# Patient Record
Sex: Female | Born: 1975 | Race: Black or African American | Hispanic: No | Marital: Single | Smoking: Never smoker
Health system: Southern US, Community
[De-identification: ages and names within clinical notes are randomized; demographics above are authoritative.]

## PROBLEM LIST (undated history)

## (undated) DIAGNOSIS — D649 Anemia, unspecified: Secondary | ICD-10-CM

## (undated) DIAGNOSIS — E119 Type 2 diabetes mellitus without complications: Secondary | ICD-10-CM

## (undated) DIAGNOSIS — I10 Essential (primary) hypertension: Secondary | ICD-10-CM

## (undated) DIAGNOSIS — I471 Supraventricular tachycardia, unspecified: Secondary | ICD-10-CM

## (undated) HISTORY — PX: NO PAST SURGERIES: SHX2092

---

## 1998-08-09 ENCOUNTER — Encounter: Payer: Self-pay | Admitting: Emergency Medicine

## 1998-08-09 ENCOUNTER — Emergency Department (HOSPITAL_COMMUNITY): Admission: EM | Admit: 1998-08-09 | Discharge: 1998-08-09 | Payer: Self-pay | Admitting: Emergency Medicine

## 2001-04-08 ENCOUNTER — Ambulatory Visit (HOSPITAL_COMMUNITY): Admission: RE | Admit: 2001-04-08 | Discharge: 2001-04-08 | Payer: Self-pay | Admitting: *Deleted

## 2001-04-08 ENCOUNTER — Encounter: Payer: Self-pay | Admitting: Family Medicine

## 2001-04-22 ENCOUNTER — Encounter: Admission: RE | Admit: 2001-04-22 | Discharge: 2001-07-21 | Payer: Self-pay | Admitting: Family Medicine

## 2001-06-04 ENCOUNTER — Other Ambulatory Visit: Admission: RE | Admit: 2001-06-04 | Discharge: 2001-06-04 | Payer: Self-pay | Admitting: Family Medicine

## 2002-10-07 ENCOUNTER — Emergency Department (HOSPITAL_COMMUNITY): Admission: EM | Admit: 2002-10-07 | Discharge: 2002-10-07 | Payer: Self-pay | Admitting: Internal Medicine

## 2004-05-25 ENCOUNTER — Emergency Department (HOSPITAL_COMMUNITY): Admission: EM | Admit: 2004-05-25 | Discharge: 2004-05-25 | Payer: Self-pay | Admitting: Emergency Medicine

## 2009-10-02 ENCOUNTER — Emergency Department (HOSPITAL_COMMUNITY): Admission: EM | Admit: 2009-10-02 | Discharge: 2009-10-02 | Payer: Self-pay | Admitting: Emergency Medicine

## 2009-10-02 IMAGING — CR DG CHEST 1V PORT
1 series · 1 of 1 positions shown · non-contrast
Comparison: None.

CLINICAL DATA: Chest pain, shortness of breath

PORTABLE CHEST - 1 VIEW

[view not recorded]
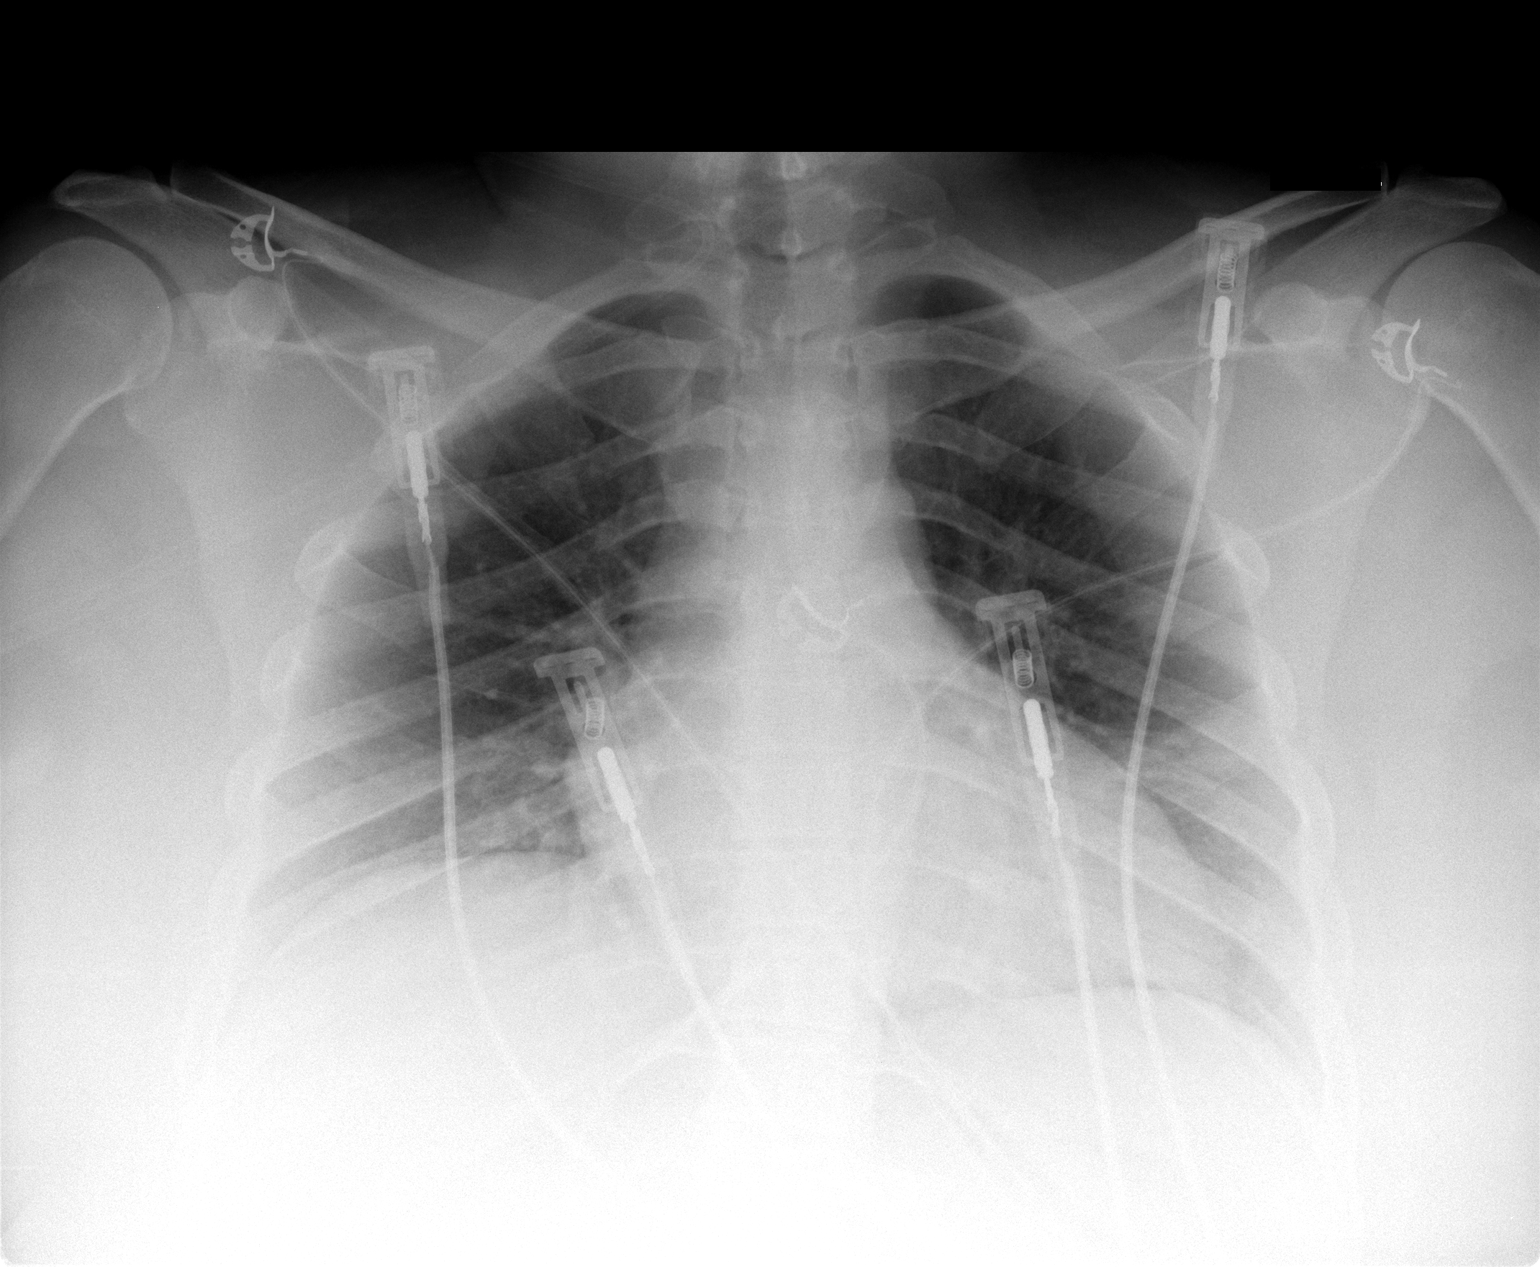

[1 of 1 positions shown; findings below may reference images not displayed]

FINDINGS: Slight elevation of the right hemidiaphragm.  Minimal
left hilar and right base atelectasis.  Normal heart size and
vascularity.  No definite pneumonia, effusion or pneumothorax.
IMPRESSION: Bilateral atelectasis.

## 2010-10-06 LAB — COMPREHENSIVE METABOLIC PANEL
ALT: 24 U/L (ref 0–35)
AST: 21 U/L (ref 0–37)
Albumin: 3.6 g/dL (ref 3.5–5.2)
Alkaline Phosphatase: 93 U/L (ref 39–117)
BUN: 8 mg/dL (ref 6–23)
CO2: 25 mEq/L (ref 19–32)
Calcium: 9.8 mg/dL (ref 8.4–10.5)
Chloride: 103 mEq/L (ref 96–112)
Creatinine, Ser: 0.77 mg/dL (ref 0.4–1.2)
GFR calc Af Amer: >60 mL/min (ref >60)
GFR calc non Af Amer: >60 mL/min (ref >60)
Glucose, Bld: 211 mg/dL — ABNORMAL HIGH (ref 70–99)
Potassium: 3.7 mEq/L (ref 3.5–5.1)
Sodium: 137 mEq/L (ref 135–145)
Total Bilirubin: 0.2 mg/dL — ABNORMAL LOW (ref 0.3–1.2)
Total Protein: 8.4 g/dL — ABNORMAL HIGH (ref 6.0–8.3)

## 2010-10-06 LAB — CBC
HCT: 35.8 % — ABNORMAL LOW (ref 36.0–46.0)
Hemoglobin: 11.6 g/dL — ABNORMAL LOW (ref 12.0–15.0)
MCHC: 32.4 g/dL (ref 30.0–36.0)
MCV: 79.1 fL (ref 78.0–100.0)
Platelets: 551 10*3/uL — ABNORMAL HIGH (ref 150–400)
RBC: 4.52 MIL/uL (ref 3.87–5.11)
RDW: 16.1 % — ABNORMAL HIGH (ref 11.5–15.5)
WBC: 11.1 10*3/uL — ABNORMAL HIGH (ref 4.0–10.5)

## 2010-10-06 LAB — DIFFERENTIAL
Basophils Relative: 0 % (ref 0–1)
Eosinophils Absolute: 0.2 10*3/uL (ref 0.0–0.7)
Eosinophils Relative: 2 % (ref 0–5)
Monocytes Absolute: 0.5 10*3/uL (ref 0.1–1.0)
Monocytes Relative: 5 % (ref 3–12)
Neutrophils Relative %: 56 % (ref 43–77)

## 2010-10-06 LAB — GLUCOSE, CAPILLARY: Glucose-Capillary: 199 mg/dL — ABNORMAL HIGH (ref 70–99)

## 2012-08-17 ENCOUNTER — Emergency Department (HOSPITAL_COMMUNITY): Payer: Self-pay

## 2012-08-17 ENCOUNTER — Encounter (HOSPITAL_COMMUNITY): Payer: Self-pay | Admitting: Emergency Medicine

## 2012-08-17 ENCOUNTER — Emergency Department (HOSPITAL_COMMUNITY)
Admission: EM | Admit: 2012-08-17 | Discharge: 2012-08-17 | Disposition: A | Discharge status: Home / Self Care | Payer: Self-pay | Attending: Emergency Medicine | Admitting: Emergency Medicine

## 2012-08-17 DIAGNOSIS — I10 Essential (primary) hypertension: Secondary | ICD-10-CM | POA: Insufficient documentation

## 2012-08-17 DIAGNOSIS — Z3202 Encounter for pregnancy test, result negative: Secondary | ICD-10-CM | POA: Insufficient documentation

## 2012-08-17 DIAGNOSIS — N949 Unspecified condition associated with female genital organs and menstrual cycle: Secondary | ICD-10-CM | POA: Insufficient documentation

## 2012-08-17 DIAGNOSIS — E119 Type 2 diabetes mellitus without complications: Secondary | ICD-10-CM | POA: Insufficient documentation

## 2012-08-17 DIAGNOSIS — R197 Diarrhea, unspecified: Secondary | ICD-10-CM | POA: Insufficient documentation

## 2012-08-17 DIAGNOSIS — D649 Anemia, unspecified: Secondary | ICD-10-CM | POA: Insufficient documentation

## 2012-08-17 DIAGNOSIS — N938 Other specified abnormal uterine and vaginal bleeding: Secondary | ICD-10-CM | POA: Insufficient documentation

## 2012-08-17 HISTORY — DX: Type 2 diabetes mellitus without complications: E11.9

## 2012-08-17 HISTORY — DX: Essential (primary) hypertension: I10

## 2012-08-17 LAB — CBC WITH DIFFERENTIAL/PLATELET
Basophils Relative: 0 % (ref 0–1)
Eosinophils Relative: 2 % (ref 0–5)
HCT: 28.5 % — ABNORMAL LOW (ref 36.0–46.0)
Hemoglobin: 8.1 g/dL — ABNORMAL LOW (ref 12.0–15.0)
Lymphs Abs: 2.7 10*3/uL (ref 0.7–4.0)
MCH: 19.8 pg — ABNORMAL LOW (ref 26.0–34.0)
MCV: 69.7 fL — ABNORMAL LOW (ref 78.0–100.0)
Monocytes Absolute: 0.4 10*3/uL (ref 0.1–1.0)
RBC: 4.09 MIL/uL (ref 3.87–5.11)

## 2012-08-17 LAB — URINALYSIS, ROUTINE W REFLEX MICROSCOPIC
Bilirubin Urine: NEGATIVE
Nitrite: NEGATIVE
Protein, ur: NEGATIVE mg/dL
Specific Gravity, Urine: 1.01 (ref 1.005–1.030)
Urobilinogen, UA: 0.2 mg/dL (ref 0.0–1.0)

## 2012-08-17 LAB — BASIC METABOLIC PANEL
BUN: 6 mg/dL (ref 6–23)
Chloride: 103 mEq/L (ref 96–112)
GFR calc Af Amer: >90 mL/min (ref >90)
Glucose, Bld: 140 mg/dL — ABNORMAL HIGH (ref 70–99)
Potassium: 3.9 mEq/L (ref 3.5–5.1)
Sodium: 140 mEq/L (ref 135–145)

## 2012-08-17 LAB — PREGNANCY, URINE: Preg Test, Ur: NEGATIVE

## 2012-08-17 IMAGING — US US TRANSVAGINAL NON-OB
1 series · 13 of 25 positions shown · non-contrast
Comparison: None

CLINICAL DATA: Fatigue.  Anemia.  Heavy bleeding.  Last menstrual
period was [DATE].



[Series 1: us transvaginal non-ob · 0.31mm/px · 13 of 105 slices shown]
[im 1/105]
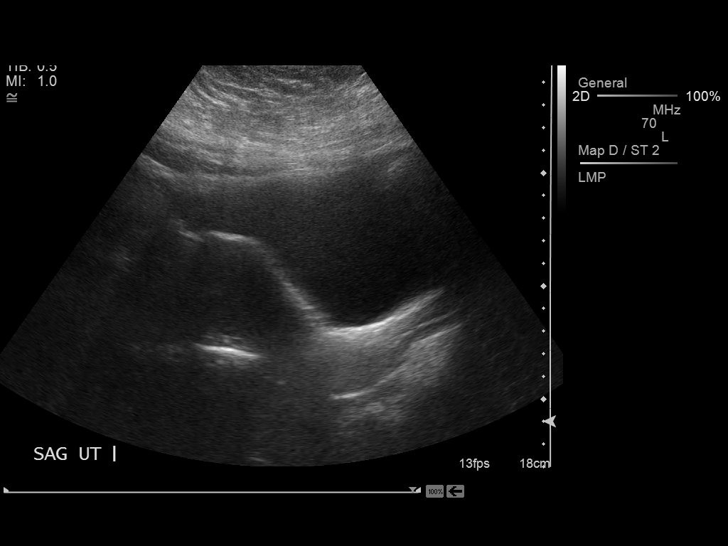
[im 9/105]
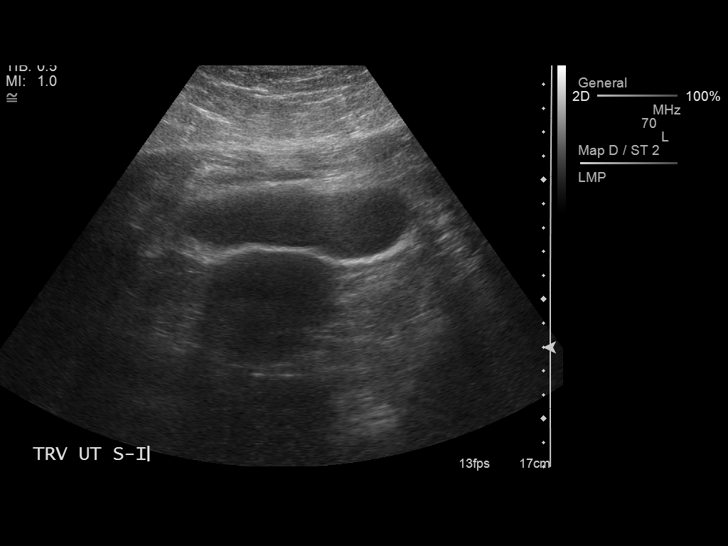
[im 18/105]
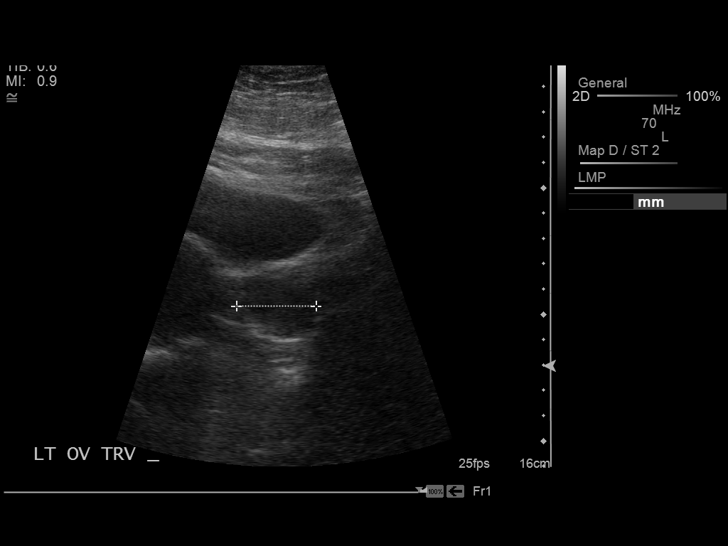
[im 27/105]
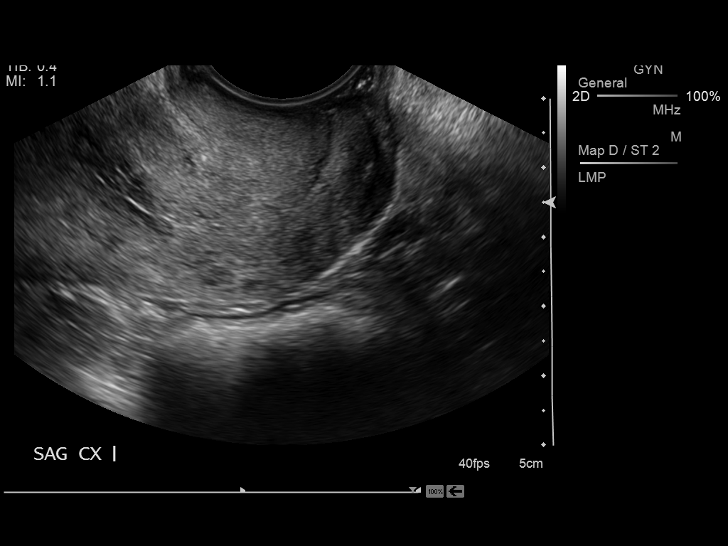
[im 35/105]
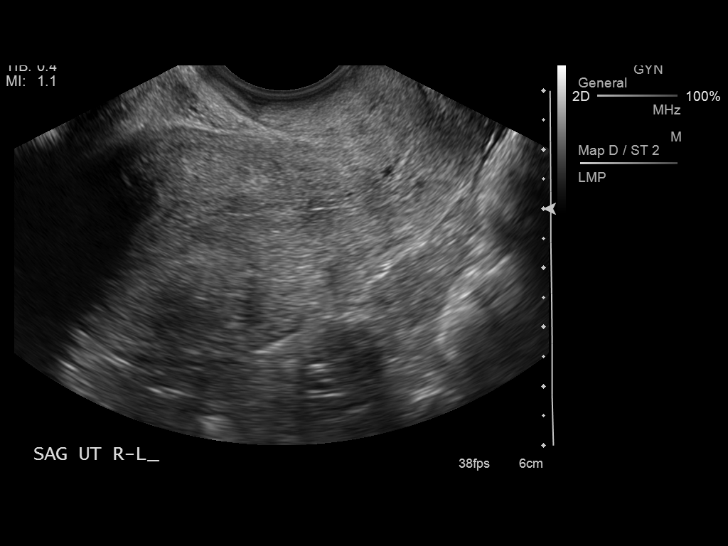
[im 44/105]
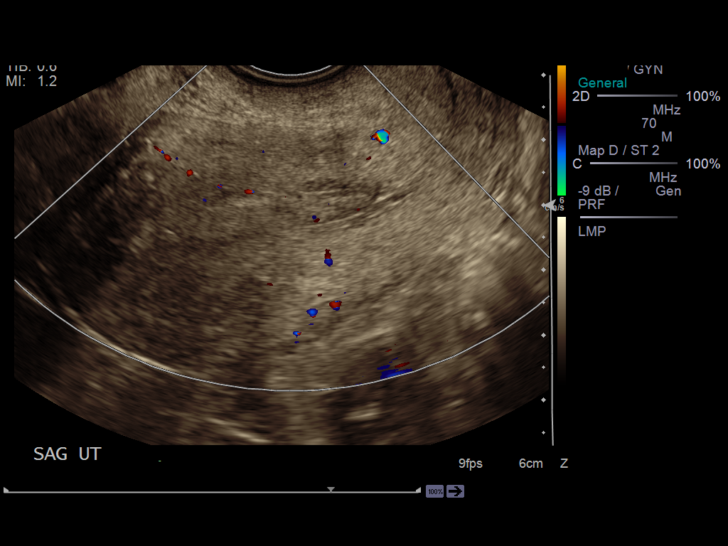
[im 53/105]
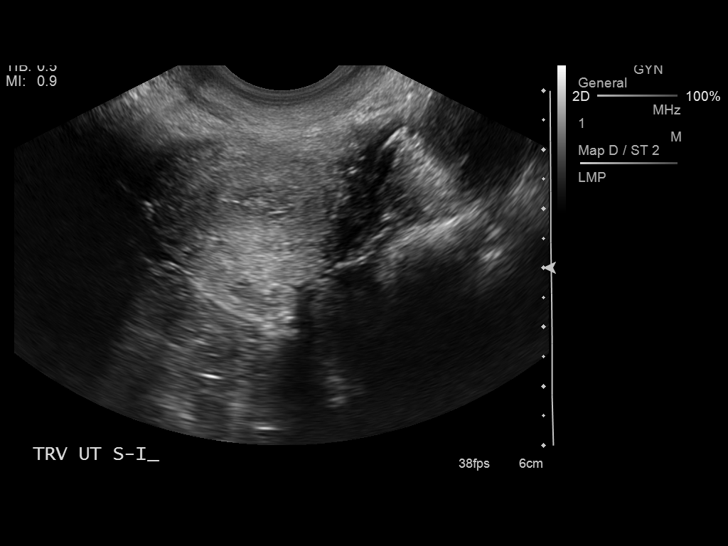
[im 61/105]
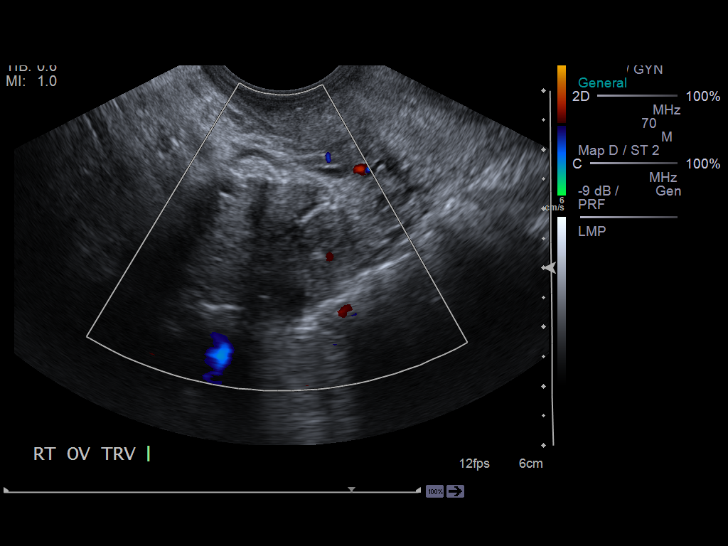
[im 70/105]
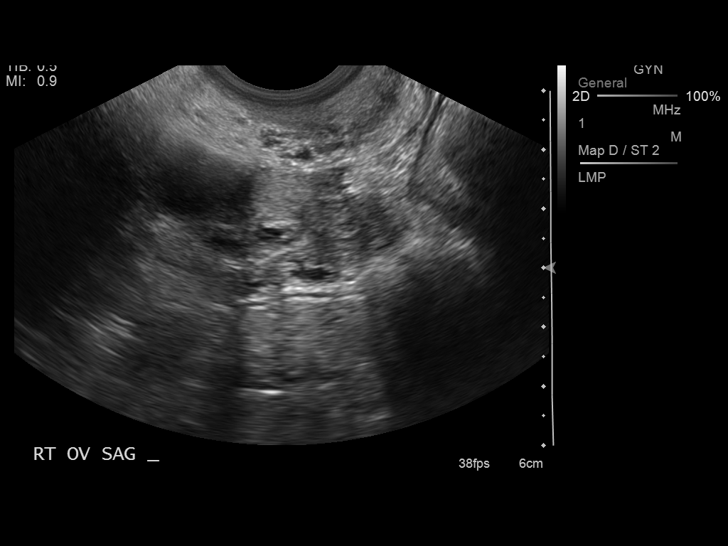
[im 79/105]
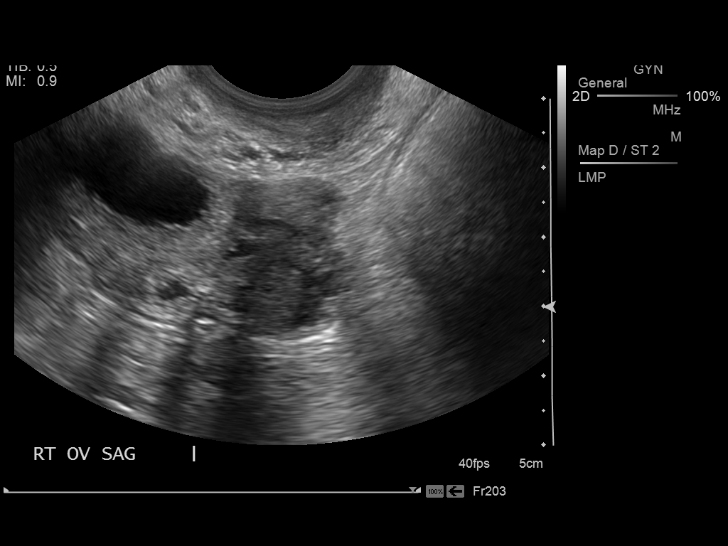
[im 87/105]
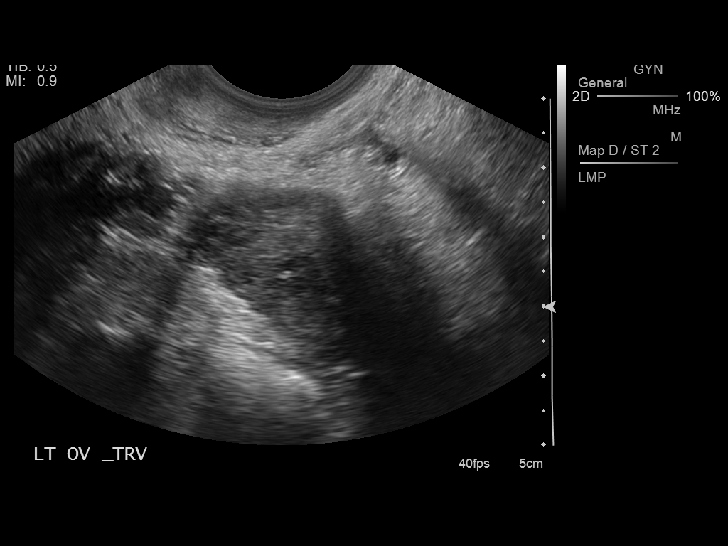
[im 96/105]
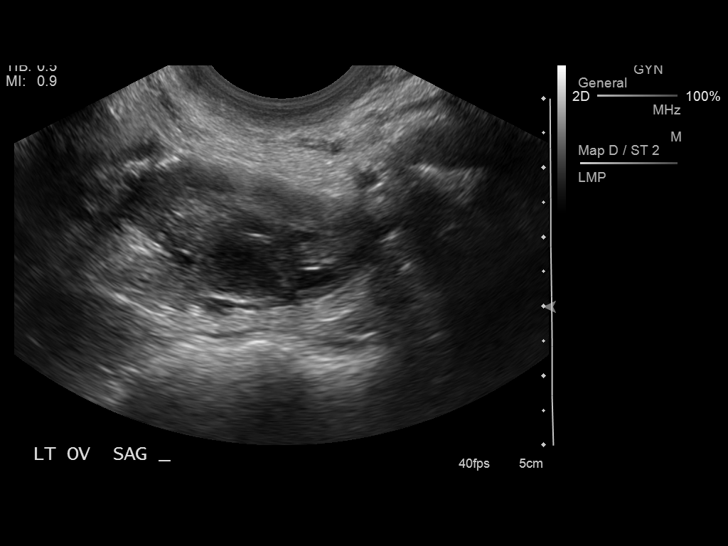
[im 105/105]
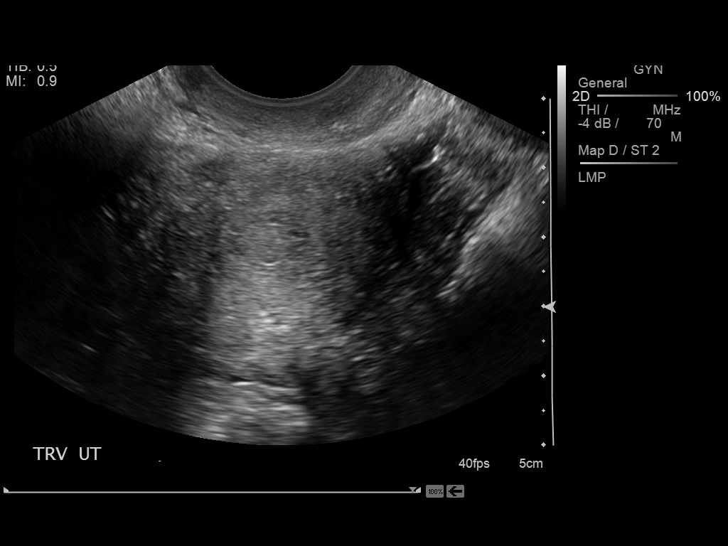

[13 of 25 positions shown; findings below may reference images not displayed]

FINDINGS: Uterus: 7.8 x 4.3 x 5 cm.

Endometrium: 9.4 mm.  A small amount of hypoechoic material is
identified within the canal.

Right ovary:  4.7 x 2.6 x 3.0 cm.  Follicles are present.

Left ovary: 3.7 x 2.0 x 2.6 cm.  Follicles are present.

Other findings: No free fluid
IMPRESSION: 1.  Hypoechoic material within the endometrial canal, raising the
question of endometrial polyp.  Endometrial carcinoma would be much
less likely. Consider further evaluation with sonohysterogram for
confirmation prior to hysteroscopy.  Endometrial sampling should
also be considered if patient is at high risk for endometrial
carcinoma. (Ref:  Radiological Reasoning: Algorithmic Workup of
Abnormal Vaginal Bleeding with Endovaginal Sonography and
Sonohysterography. AJR [40]; 191:S68-73)
2.  Normal-appearing ovaries.

3.  No evidence for uterine mass..

## 2012-08-17 NOTE — ED Notes (Signed)
Pt reports that she was sent by [facility] for further eval of low hgb,  She stated she is normally 7.6 but today was 7.0 has been weak.  And more tired than usual.  Denies any blood in stool.  Stated she has heavy periods and think may be related.

## 2012-08-17 NOTE — ED Provider Notes (Signed)
History    This chart was scribed for Benny Lennert, MD, MD by Smitty Pluck, ED Scribe. The patient was seen in room APA01 and the patient's care was started at 1:39 PM.   CSN: [id number]  Arrival date & time 08/17/12  1240      Chief Complaint  Patient presents with  . Fatigue  . Anemia    Patient is a 37 y.o. female presenting with anemia. The history is provided by the patient. No language interpreter was used.  Anemia This is a chronic problem. The problem occurs every several days. The problem has not changed since onset.Pertinent negatives include no chest pain, no abdominal pain and no headaches. Nothing aggravates the symptoms. Nothing relieves the symptoms.   Kathryn Morris is a 37 y.o. female w/ h/o DM, HTN and anemia who presents to the Emergency Department complaining of fatigue onset 3 months ago. Pt was seen at PCP at [facility] and was told she was anemic (hgb 7.6 and hct 26.0). She reports having mild lower abdominal pain. She states she has taken iron supplements due to hx of anemia and has dark colored stools. She denies emesis, diarrhea, fever and any other pain.   Past Medical History  Diagnosis Date  . Diabetes mellitus without complication   . Hypertension     History reviewed. No pertinent past surgical history.  Family History  Problem Relation Age of Onset  . Diabetes Mother   . Heart failure Mother   . Diabetes Other     History  Substance Use Topics  . Smoking status: Never Smoker   . Smokeless tobacco: Never Used  . Alcohol Use: No    OB History    Grav Para Term Preterm Abortions TAB SAB Ect Mult Living   1    1  1    0      Review of Systems  Constitutional: Positive for fatigue.  HENT: Negative for congestion, sinus pressure and ear discharge.   Eyes: Negative for discharge.  Respiratory: Negative for cough.   Cardiovascular: Negative for chest pain.  Gastrointestinal: Negative for vomiting, abdominal pain and  diarrhea.  Genitourinary: Negative for frequency and hematuria.  Musculoskeletal: Negative for back pain.  Skin: Negative for rash.  Neurological: Negative for seizures and headaches.  Hematological: Negative.   Psychiatric/Behavioral: Negative for hallucinations.  All other systems reviewed and are negative.   10 Systems reviewed and all are negative for acute change except as noted in the HPI.   Allergies  Review of patient's allergies indicates no known allergies.  Home Medications  No current outpatient prescriptions on file.  BP 182/82  Pulse 78  Temp 98.2 F (36.8 C) (Oral)  Resp 20  Ht 5\' 11"  (1.803 m)  Wt 302 lb (136.986 kg)  BMI 42.12 kg/m2  SpO2 100%  LMP 08/07/2012  Physical Exam  Nursing note and vitals reviewed. Constitutional: She is oriented to person, place, and time. She appears well-developed.       obese  HENT:  Head: Normocephalic and atraumatic.  Eyes: Conjunctivae normal and EOM are normal. No scleral icterus.  Neck: Neck supple. No thyromegaly present.  Cardiovascular: Normal rate and regular rhythm.  Exam reveals no gallop and no friction rub.   No murmur heard. Pulmonary/Chest: No stridor. She has no wheezes. She has no rales. She exhibits no tenderness.  Abdominal: She exhibits no distension. There is no tenderness. There is no rebound.  Genitourinary:  Heme negative  Musculoskeletal: Normal range of motion. She exhibits no edema.  Lymphadenopathy:    She has no cervical adenopathy.  Neurological: She is oriented to person, place, and time. Coordination normal.  Skin: No rash noted. No erythema.  Psychiatric: She has a normal mood and affect. Her behavior is normal.    ED Course  Procedures (including critical care time) DIAGNOSTIC STUDIES: Oxygen Saturation is 100% on room air, normal by my interpretation.    COORDINATION OF CARE: 1:43 PM Discussed ED treatment with pt and pt agrees.     Labs Reviewed  CBC WITH  DIFFERENTIAL - Abnormal; Notable for the following:    Hemoglobin 8.1 (*)     HCT 28.5 (*)     MCV 69.7 (*)     MCH 19.8 (*)     MCHC 28.4 (*)     RDW 19.6 (*)     Platelets 507 (*)     All other components within normal limits  BASIC METABOLIC PANEL - Abnormal; Notable for the following:    Glucose, Bld 140 (*)     All other components within normal limits  PREGNANCY, URINE  URINALYSIS, ROUTINE W REFLEX MICROSCOPIC  TYPE AND SCREEN   US Transvaginal Non-ob  08/17/2012  *RADIOLOGY REPORT*  Clinical Data: Fatigue.  Anemia.  Heavy bleeding.  Last menstrual period was 08/07/2012.  TRANSABDOMINAL AND TRANSVAGINAL ULTRASOUND OF PELVIS Technique:  Both transabdominal and transvaginal ultrasound examinations of the pelvis were performed. Transabdominal technique was performed for global imaging of the pelvis including uterus, ovaries, adnexal regions, and pelvic cul-de-sac.  It was necessary to proceed with endovaginal exam following the transabdominal exam to visualize the endometrium and ovaries.  Comparison:  None  Findings:  Uterus: 7.8 x 4.3 x 5 cm.  Endometrium: 9.4 mm.  A small amount of hypoechoic material is identified within the canal.  Right ovary:  4.7 x 2.6 x 3.0 cm.  Follicles are present.  Left ovary: 3.7 x 2.0 x 2.6 cm.  Follicles are present.  Other findings: No free fluid  IMPRESSION:  1.  Hypoechoic material within the endometrial canal, raising the question of endometrial polyp.  Endometrial carcinoma would be much less likely. Consider further evaluation with sonohysterogram for confirmation prior to hysteroscopy.  Endometrial sampling should also be considered if patient is at high risk for endometrial carcinoma. (Ref:  Radiological Reasoning: Algorithmic Workup of Abnormal Vaginal Bleeding with Endovaginal Sonography and Sonohysterography. AJR 2008; 409:W11-91) 2.  Normal-appearing ovaries.  3.  No evidence for uterine mass.   Original Report Authenticated By: Norva Pavlov, M.D.     US Pelvis Complete  08/17/2012  *RADIOLOGY REPORT*  Clinical Data: Fatigue.  Anemia.  Heavy bleeding.  Last menstrual period was 08/07/2012.  TRANSABDOMINAL AND TRANSVAGINAL ULTRASOUND OF PELVIS Technique:  Both transabdominal and transvaginal ultrasound examinations of the pelvis were performed. Transabdominal technique was performed for global imaging of the pelvis including uterus, ovaries, adnexal regions, and pelvic cul-de-sac.  It was necessary to proceed with endovaginal exam following the transabdominal exam to visualize the endometrium and ovaries.  Comparison:  None  Findings:  Uterus: 7.8 x 4.3 x 5 cm.  Endometrium: 9.4 mm.  A small amount of hypoechoic material is identified within the canal.  Right ovary:  4.7 x 2.6 x 3.0 cm.  Follicles are present.  Left ovary: 3.7 x 2.0 x 2.6 cm.  Follicles are present.  Other findings: No free fluid  IMPRESSION:  1.  Hypoechoic material within the  endometrial canal, raising the question of endometrial polyp.  Endometrial carcinoma would be much less likely. Consider further evaluation with sonohysterogram for confirmation prior to hysteroscopy.  Endometrial sampling should also be considered if patient is at high risk for endometrial carcinoma. (Ref:  Radiological Reasoning: Algorithmic Workup of Abnormal Vaginal Bleeding with Endovaginal Sonography and Sonohysterography. AJR 2008; 782:N56-21) 2.  Normal-appearing ovaries.  3.  No evidence for uterine mass.   Original Report Authenticated By: Norva Pavlov, M.D.      No diagnosis found.    MDM      Abnormal uterine bleeding  The chart was scribed for me under my direct supervision.  I personally performed the history, physical, and medical decision making and all procedures in the evaluation of this patient.Benny Lennert, MD 08/17/12 [phone]

## 2012-08-17 NOTE — ED Notes (Signed)
Pt to ultrasound via stretcher

## 2012-08-17 NOTE — ED Notes (Signed)
Patient sent by [facility] due to low hgb 7.6 and hct 26.0. Patient reports feeling fatigue since October and was told to take iron pills. Patient denies any blood in urine or stool but reports stools as dark due to the iron pills. Patient also reports upper to navel region abd pain and nausea. Denies fevers or vomiting. Reports diarrhea but states "it is always like that cause I take Metformin."

## 2012-08-18 LAB — TYPE AND SCREEN: Antibody Screen: NEGATIVE

## 2014-05-15 ENCOUNTER — Encounter (HOSPITAL_COMMUNITY): Payer: Self-pay | Admitting: Emergency Medicine

## 2014-06-22 ENCOUNTER — Emergency Department (HOSPITAL_COMMUNITY)
Admission: EM | Admit: 2014-06-22 | Discharge: 2014-06-22 | Disposition: A | Discharge status: Home / Self Care | Payer: Self-pay | Attending: Emergency Medicine | Admitting: Emergency Medicine

## 2014-06-22 ENCOUNTER — Emergency Department (HOSPITAL_COMMUNITY): Payer: Self-pay

## 2014-06-22 ENCOUNTER — Encounter (HOSPITAL_COMMUNITY): Payer: Self-pay | Admitting: Emergency Medicine

## 2014-06-22 DIAGNOSIS — Z79899 Other long term (current) drug therapy: Secondary | ICD-10-CM | POA: Insufficient documentation

## 2014-06-22 DIAGNOSIS — J159 Unspecified bacterial pneumonia: Secondary | ICD-10-CM | POA: Insufficient documentation

## 2014-06-22 DIAGNOSIS — R059 Cough, unspecified: Secondary | ICD-10-CM

## 2014-06-22 DIAGNOSIS — R05 Cough: Secondary | ICD-10-CM

## 2014-06-22 DIAGNOSIS — Z792 Long term (current) use of antibiotics: Secondary | ICD-10-CM | POA: Insufficient documentation

## 2014-06-22 DIAGNOSIS — E119 Type 2 diabetes mellitus without complications: Secondary | ICD-10-CM | POA: Insufficient documentation

## 2014-06-22 DIAGNOSIS — I10 Essential (primary) hypertension: Secondary | ICD-10-CM | POA: Insufficient documentation

## 2014-06-22 DIAGNOSIS — Z794 Long term (current) use of insulin: Secondary | ICD-10-CM | POA: Insufficient documentation

## 2014-06-22 DIAGNOSIS — J189 Pneumonia, unspecified organism: Secondary | ICD-10-CM

## 2014-06-22 IMAGING — CR DG CHEST 2V
2 series · 2 of 2 positions shown · non-contrast
Comparison: [DATE]

CLINICAL DATA: Cough, congestion, chills, and body aches for 1
week.

EXAM:
CHEST  2 VIEW

[view not recorded (1 of 2)]
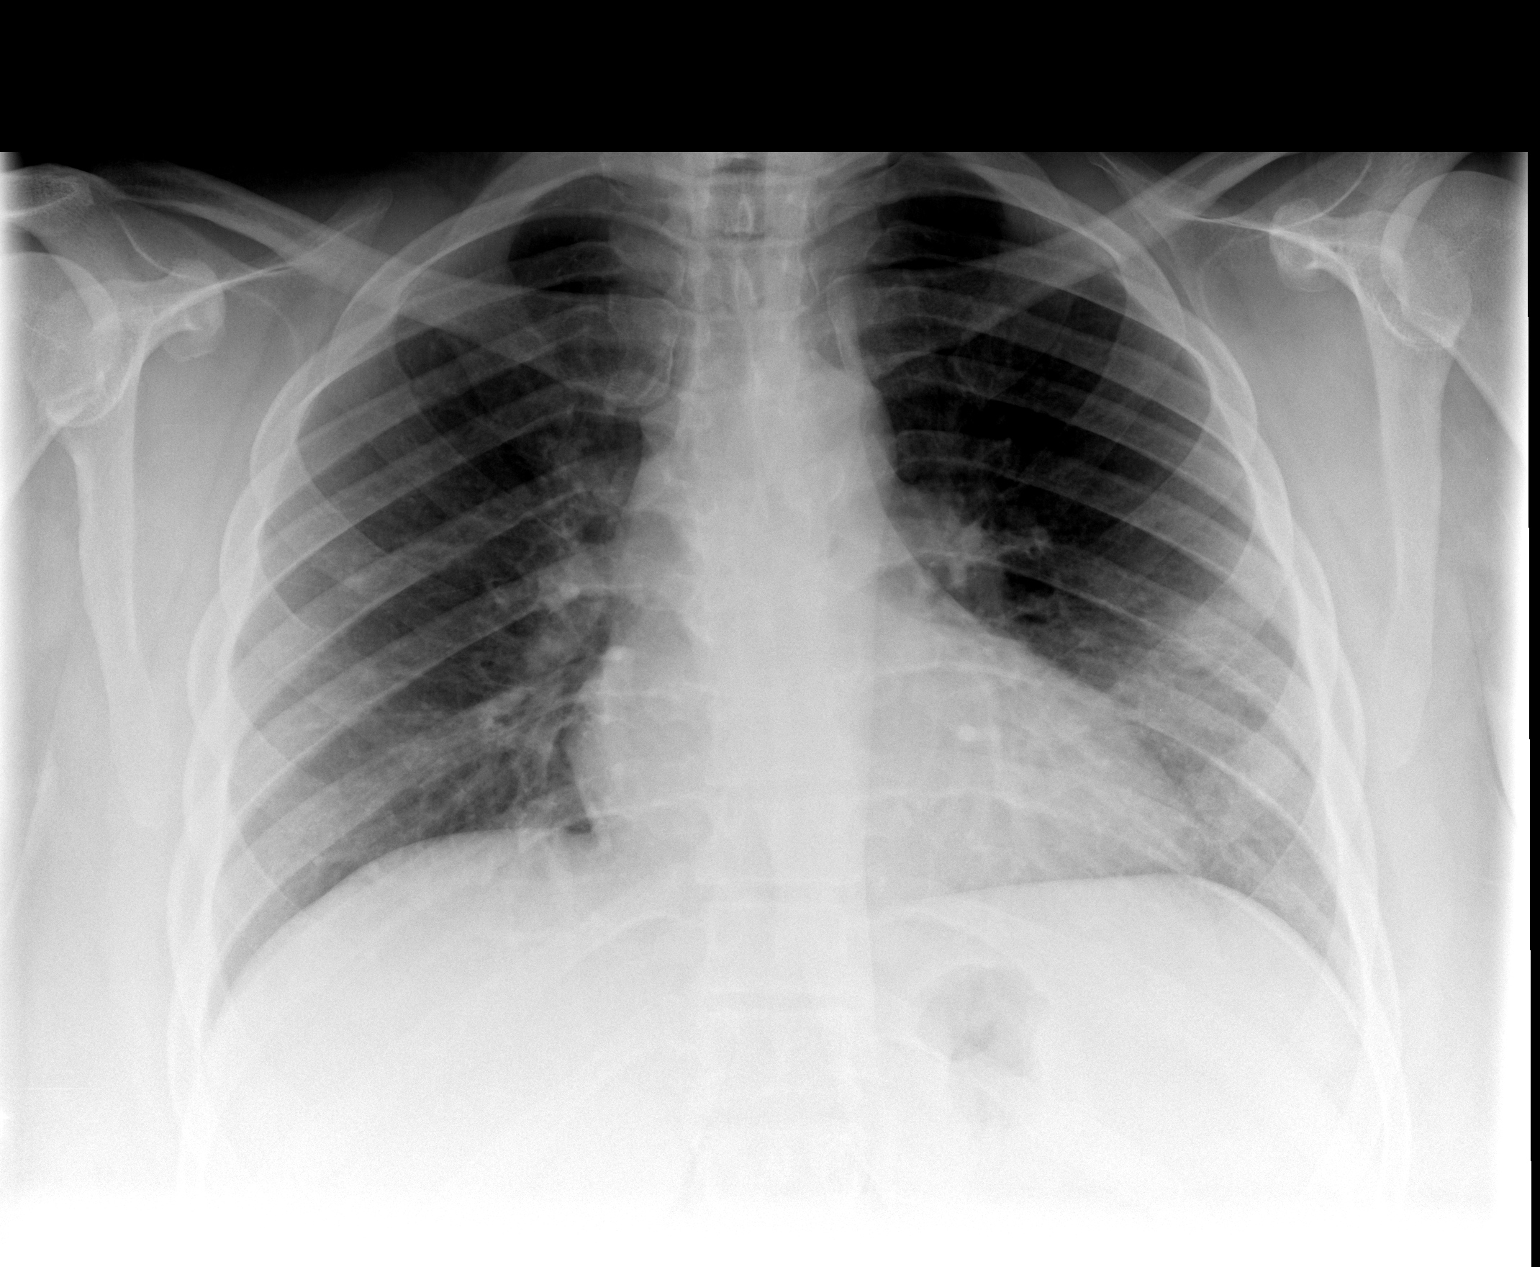

[view not recorded (2 of 2)]
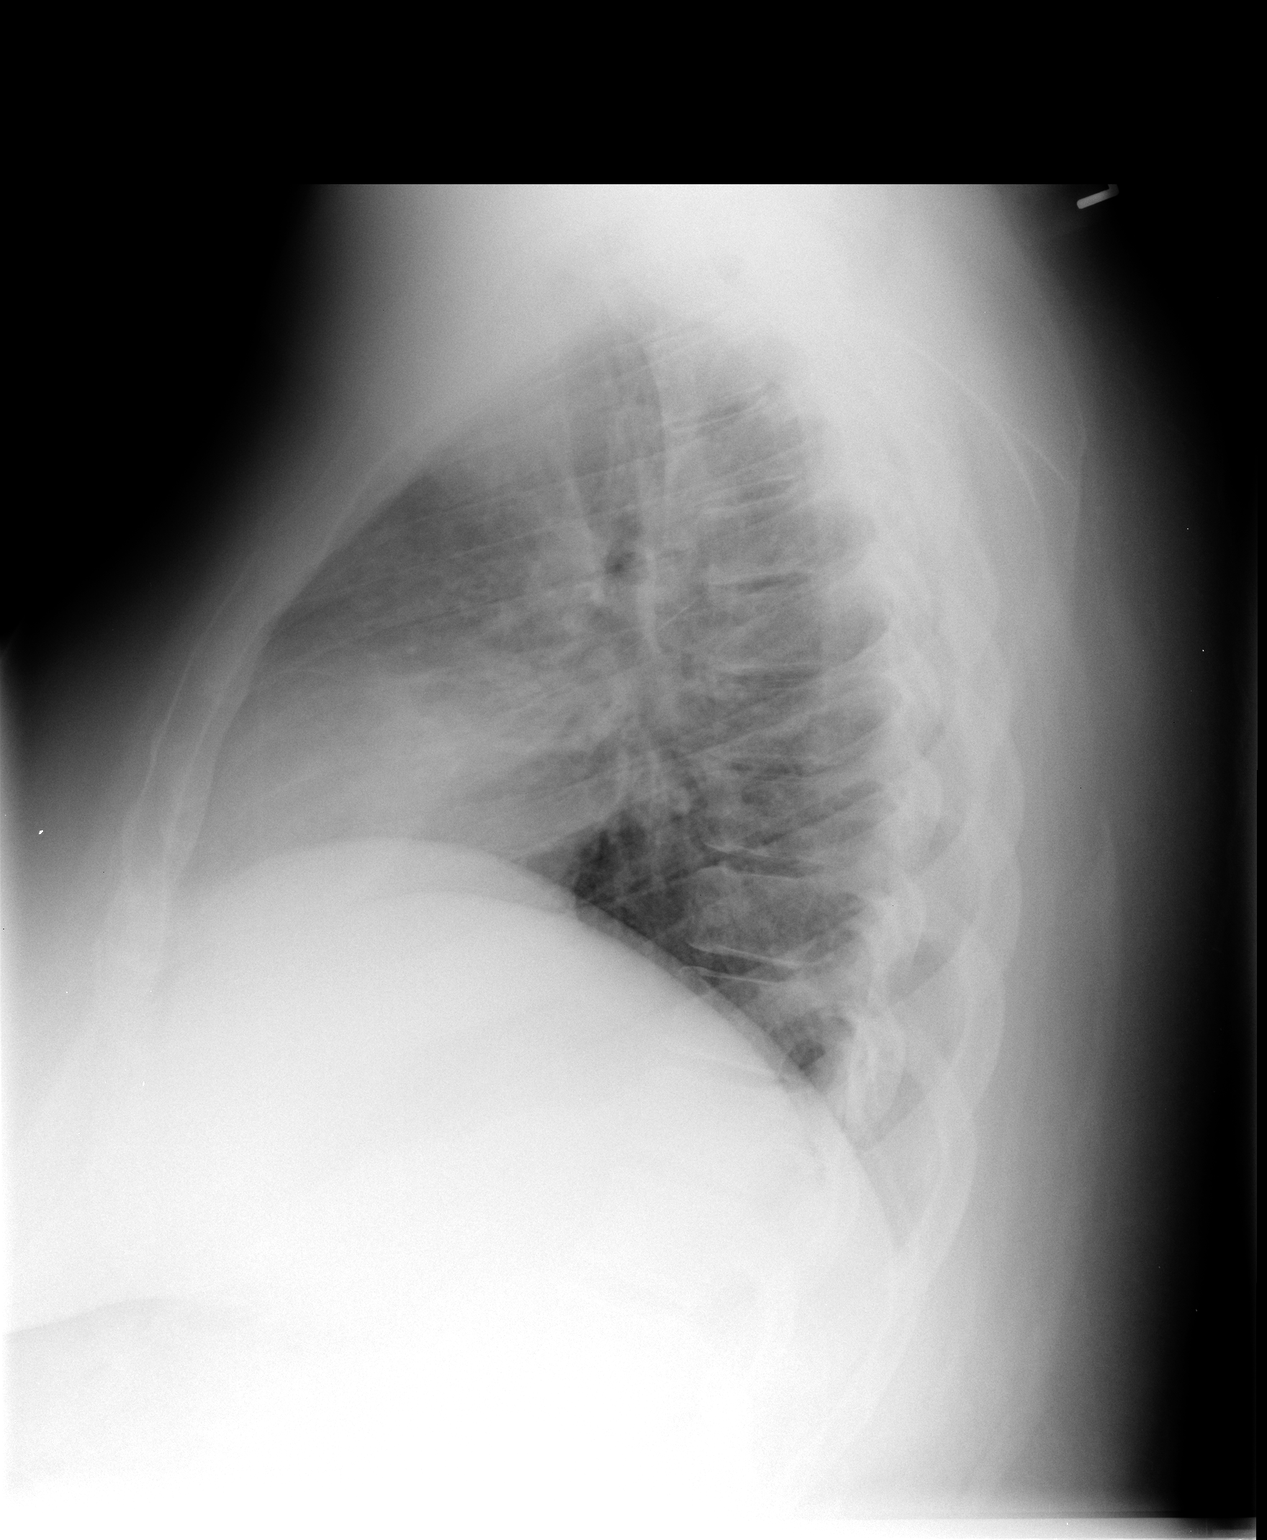

[2 of 2 positions shown; findings below may reference images not displayed]

FINDINGS: Normal heart size and pulmonary vascularity. There is suggestion of
focal patchy infiltration in the left lingula which may represent
early acute pneumonia. No blunting of costophrenic angles. No
pneumothorax.
IMPRESSION: Early infiltration suggested in the left lingula.

## 2014-06-22 MED ORDER — AZITHROMYCIN 250 MG PO TABS: 250.0000 mg | ORAL_TABLET | Freq: Once | ORAL | Status: DC

## 2014-06-22 MED ORDER — AZITHROMYCIN 250 MG PO TABS
500.0000 mg | ORAL_TABLET | Freq: Once | ORAL | Status: AC
  Administered 2014-06-22: 500 mg via ORAL
  Filled 2014-06-22: qty 2

## 2014-06-22 NOTE — Discharge Instructions (Signed)
Follow up with your md in one week 

## 2014-06-22 NOTE — ED Notes (Signed)
Pt c/o increased sob and congestion x one week.

## 2014-06-22 NOTE — ED Provider Notes (Signed)
CSN: [id number]     Arrival date & time 06/22/14  0010 History   First MD Initiated Contact with Patient 06/22/14 0049     Chief Complaint  Patient presents with  . Shortness of Breath     (Consider location/radiation/quality/duration/timing/severity/associated sxs/prior Treatment) Patient is a 38 y.o. female presenting with shortness of breath. The history is provided by the patient (the pt complains of a cough for one week).  Shortness of Breath Severity:  Moderate Onset quality:  Gradual Timing:  Constant Progression:  Unchanged Chronicity:  New Context: not activity   Associated symptoms: no abdominal pain, no chest pain, no cough, no headaches and no rash     Past Medical History  Diagnosis Date  . Diabetes mellitus without complication   . Hypertension    History reviewed. No pertinent past surgical history. Family History  Problem Relation Age of Onset  . Diabetes Mother   . Heart failure Mother   . Diabetes Other    History  Substance Use Topics  . Smoking status: Never Smoker   . Smokeless tobacco: Never Used  . Alcohol Use: No   OB History    Gravida Para Term Preterm AB TAB SAB Ectopic Multiple Living   1    1  1    0     Review of Systems  Constitutional: Negative for appetite change and fatigue.  HENT: Negative for congestion, ear discharge and sinus pressure.   Eyes: Negative for discharge.  Respiratory: Positive for shortness of breath. Negative for cough.   Cardiovascular: Negative for chest pain.  Gastrointestinal: Negative for abdominal pain and diarrhea.  Genitourinary: Negative for frequency and hematuria.  Musculoskeletal: Negative for back pain.  Skin: Negative for rash.  Neurological: Negative for seizures and headaches.  Psychiatric/Behavioral: Negative for hallucinations.      Allergies  Review of patient's allergies indicates no known allergies.  Home Medications   Prior to Admission medications   Medication Sig Start Date  End Date Taking? Authorizing Provider  azithromycin (ZITHROMAX) 250 MG tablet Take 1 tablet (250 mg total) by mouth once. Take first 2 tablets together, then 1 every day until finished. 06/22/14   Benny LennertJoseph L Rodolfo Notaro, MD  insulin glargine (LANTUS) 100 UNIT/ML injection Inject 45 Units into the skin at bedtime.    Historical Provider, MD  metFORMIN (GLUCOPHAGE) 1000 MG tablet Take 1,000 mg by mouth 2 (two) times daily with a meal.    Historical Provider, MD  metoprolol tartrate (LOPRESSOR) 25 MG tablet Take 25 mg by mouth 2 (two) times daily.    Historical Provider, MD   BP 171/85 mmHg  Pulse 72  Temp(Src) 97.9 F (36.6 C)  Resp 18  Ht 5\' 11"  (1.803 m)  Wt 280 lb (127.007 kg)  BMI 39.07 kg/m2  SpO2 99%  LMP 06/03/2014 (Exact Date) Physical Exam  Constitutional: She is oriented to person, place, and time. She appears well-developed.  HENT:  Head: Normocephalic.  Eyes: Conjunctivae and EOM are normal. No scleral icterus.  Neck: Neck supple. No thyromegaly present.  Cardiovascular: Normal rate and regular rhythm.  Exam reveals no gallop and no friction rub.   No murmur heard. Pulmonary/Chest: No stridor. She has no wheezes. She has no rales. She exhibits no tenderness.  Abdominal: She exhibits no distension. There is no tenderness. There is no rebound.  Musculoskeletal: Normal range of motion. She exhibits no edema.  Lymphadenopathy:    She has no cervical adenopathy.  Neurological: She is oriented to person, place,  and time. She exhibits normal muscle tone. Coordination normal.  Skin: No rash noted. No erythema.  Psychiatric: She has a normal mood and affect. Her behavior is normal.    ED Course  Procedures (including critical care time) Labs Review Labs Reviewed - No data to display  Imaging Review Dg Chest 2 View  06/22/2014   CLINICAL DATA:  Cough, congestion, chills, and body aches for 1 week.  EXAM: CHEST  2 VIEW  COMPARISON:  10/02/2009  FINDINGS: Normal heart size and  pulmonary vascularity. There is suggestion of focal patchy infiltration in the left lingula which may represent early acute pneumonia. No blunting of costophrenic angles. No pneumothorax.  IMPRESSION: Early infiltration suggested in the left lingula.   Electronically Signed   By: Burman NievesWilliam  Stevens M.D.   On: 06/22/2014 01:34     EKG Interpretation None      MDM   Final diagnoses:  Cough  Community acquired pneumonia    Pneumonia,   tx with zithromax    Benny LennertJoseph L Shatyra Becka, MD 06/22/14 [phone]

## 2015-01-11 ENCOUNTER — Emergency Department (HOSPITAL_COMMUNITY)
Admission: EM | Admit: 2015-01-11 | Discharge: 2015-01-11 | Disposition: A | Discharge status: Home / Self Care | Payer: Self-pay | Attending: Emergency Medicine | Admitting: Emergency Medicine

## 2015-01-11 ENCOUNTER — Emergency Department (HOSPITAL_COMMUNITY): Payer: Self-pay

## 2015-01-11 ENCOUNTER — Encounter (HOSPITAL_COMMUNITY): Payer: Self-pay | Admitting: *Deleted

## 2015-01-11 DIAGNOSIS — Z3202 Encounter for pregnancy test, result negative: Secondary | ICD-10-CM | POA: Insufficient documentation

## 2015-01-11 DIAGNOSIS — D649 Anemia, unspecified: Secondary | ICD-10-CM | POA: Insufficient documentation

## 2015-01-11 DIAGNOSIS — Z79899 Other long term (current) drug therapy: Secondary | ICD-10-CM | POA: Insufficient documentation

## 2015-01-11 DIAGNOSIS — R0789 Other chest pain: Secondary | ICD-10-CM | POA: Insufficient documentation

## 2015-01-11 DIAGNOSIS — Z794 Long term (current) use of insulin: Secondary | ICD-10-CM | POA: Insufficient documentation

## 2015-01-11 DIAGNOSIS — I10 Essential (primary) hypertension: Secondary | ICD-10-CM | POA: Insufficient documentation

## 2015-01-11 DIAGNOSIS — E119 Type 2 diabetes mellitus without complications: Secondary | ICD-10-CM | POA: Insufficient documentation

## 2015-01-11 DIAGNOSIS — I471 Supraventricular tachycardia: Secondary | ICD-10-CM | POA: Insufficient documentation

## 2015-01-11 HISTORY — DX: Anemia, unspecified: D64.9

## 2015-01-11 LAB — BASIC METABOLIC PANEL
ANION GAP: 10 (ref 5–15)
BUN: 11 mg/dL (ref 6–20)
CALCIUM: 8.6 mg/dL — AB (ref 8.9–10.3)
CO2: 24 mmol/L (ref 22–32)
CREATININE: 0.73 mg/dL (ref 0.44–1.00)
Chloride: 103 mmol/L (ref 101–111)
GFR calc Af Amer: >60 mL/min (ref >60)
Glucose, Bld: 204 mg/dL — ABNORMAL HIGH (ref 65–99)
POTASSIUM: 3.7 mmol/L (ref 3.5–5.1)
SODIUM: 137 mmol/L (ref 135–145)

## 2015-01-11 LAB — CBC WITH DIFFERENTIAL/PLATELET
BASOS ABS: 0 10*3/uL (ref 0.0–0.1)
Basophils Relative: 0 % (ref 0–1)
EOS ABS: 0.1 10*3/uL (ref 0.0–0.7)
Eosinophils Relative: 1 % (ref 0–5)
HCT: 43.6 % (ref 36.0–46.0)
HEMOGLOBIN: 14.3 g/dL (ref 12.0–15.0)
LYMPHS ABS: 1.3 10*3/uL (ref 0.7–4.0)
LYMPHS PCT: 21 % (ref 12–46)
MCH: 29.5 pg (ref 26.0–34.0)
MCHC: 32.8 g/dL (ref 30.0–36.0)
MCV: 89.9 fL (ref 78.0–100.0)
MONOS PCT: 6 % (ref 3–12)
Monocytes Absolute: 0.4 10*3/uL (ref 0.1–1.0)
NEUTROS PCT: 72 % (ref 43–77)
Neutro Abs: 4.6 10*3/uL (ref 1.7–7.7)
Platelets: 434 10*3/uL — ABNORMAL HIGH (ref 150–400)
RBC: 4.85 MIL/uL (ref 3.87–5.11)
RDW: 13.9 % (ref 11.5–15.5)
WBC: 6.3 10*3/uL (ref 4.0–10.5)

## 2015-01-11 LAB — URINALYSIS, ROUTINE W REFLEX MICROSCOPIC
Bilirubin Urine: NEGATIVE
GLUCOSE, UA: 100 mg/dL — AB
Hgb urine dipstick: NEGATIVE
Ketones, ur: NEGATIVE mg/dL
LEUKOCYTES UA: NEGATIVE
NITRITE: NEGATIVE
Protein, ur: NEGATIVE mg/dL
Specific Gravity, Urine: >1.03 — ABNORMAL HIGH (ref 1.005–1.030)
Urobilinogen, UA: 0.2 mg/dL (ref 0.0–1.0)
pH: 5.5 (ref 5.0–8.0)

## 2015-01-11 LAB — PREGNANCY, URINE: Preg Test, Ur: NEGATIVE

## 2015-01-11 LAB — D-DIMER, QUANTITATIVE: D-Dimer, Quant: 0.69 ug/mL-FEU — ABNORMAL HIGH (ref 0.00–0.48)

## 2015-01-11 IMAGING — CR DG CHEST 1V PORT
1 series · 1 of 1 positions shown · non-contrast
Comparison: [DATE]

CLINICAL DATA: Shortness of breath, palpitations

EXAM:
PORTABLE CHEST - 1 VIEW

[ap portable]
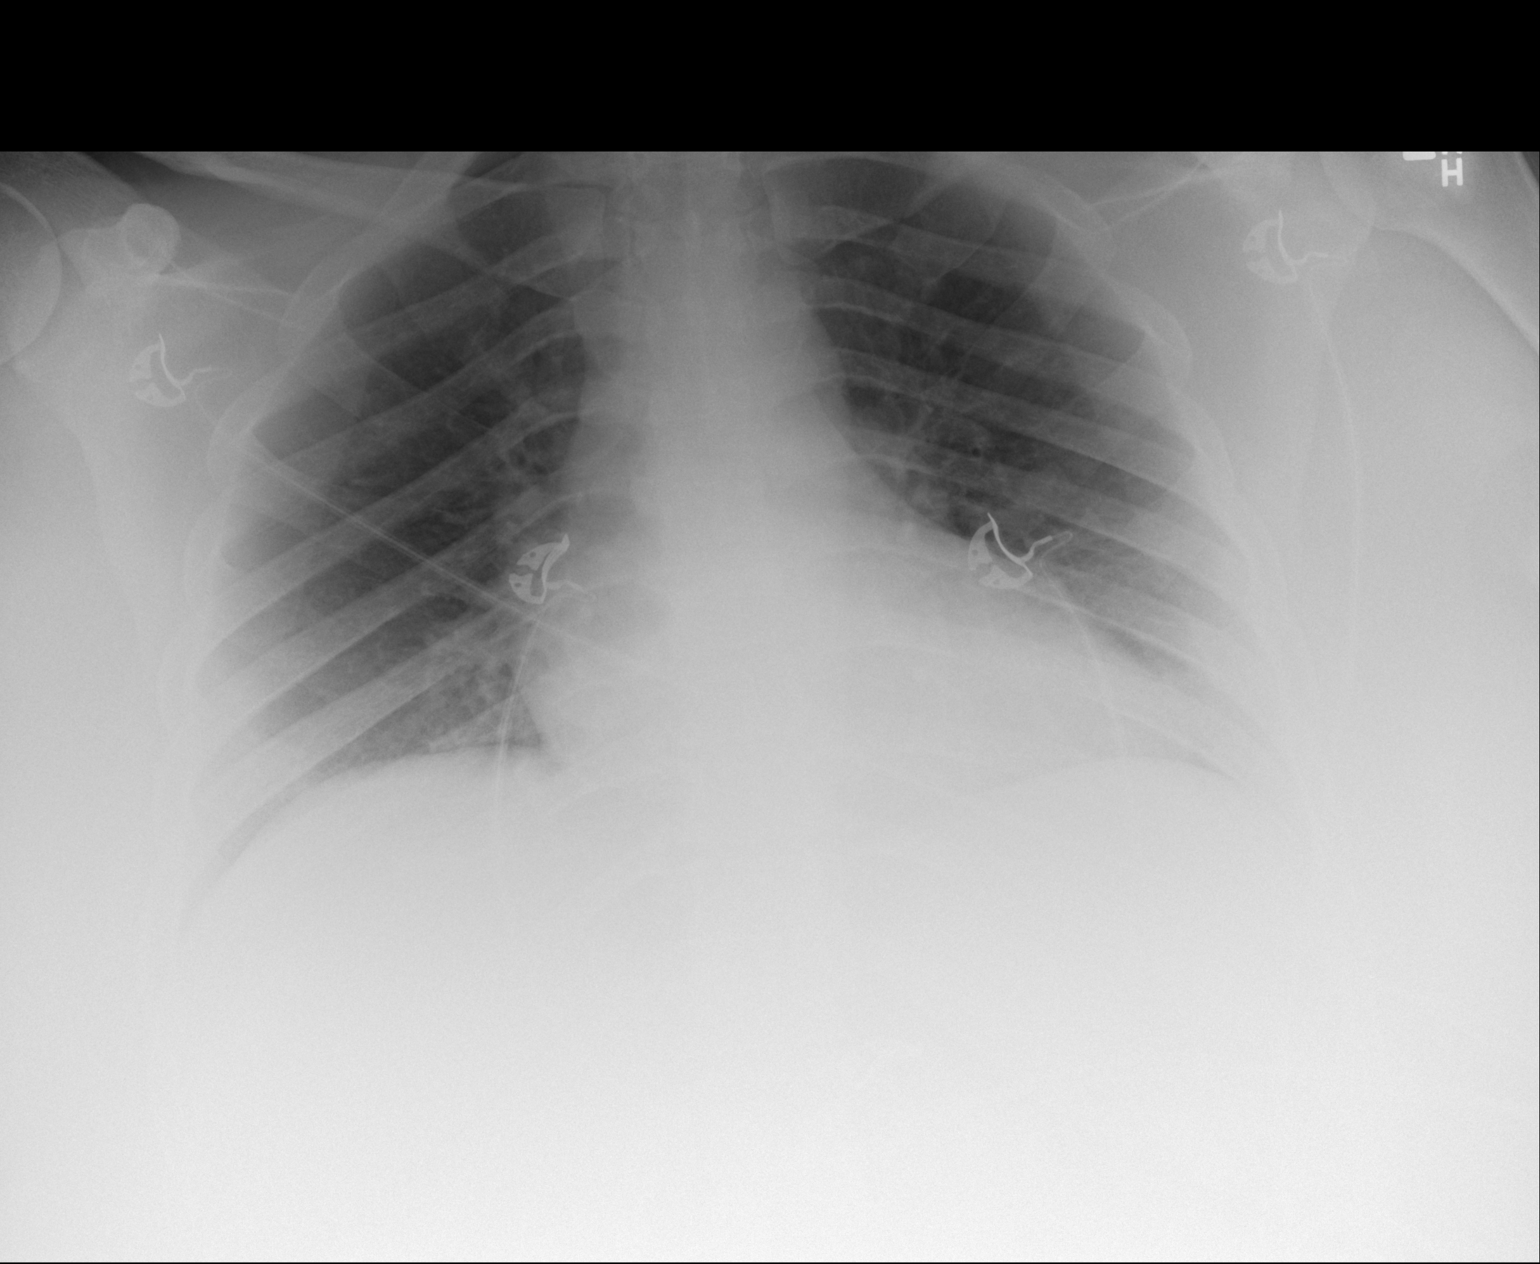

[1 of 1 positions shown; findings below may reference images not displayed]

FINDINGS: Cardiomediastinal silhouette is stable. No acute infiltrate or
pleural effusion. No pulmonary edema. Bony thorax is unremarkable.
Suboptimal study due to patient's large body habitus.
IMPRESSION: No active disease.

## 2015-01-11 IMAGING — CT CT ANGIO CHEST
1 of 6 series · 5 of 36 positions shown · IV contrast (Omnipaque 300)
Comparison: Chest radiograph of same day.

CLINICAL DATA: Shortness of breath, elevated D-dimer level.

EXAM:
CT ANGIOGRAPHY CHEST WITH CONTRAST
TECHNIQUE: Multidetector CT imaging of the chest was performed using the
standard protocol during bolus administration of intravenous
contrast. Multiplanar CT image reconstructions and MIPs were
obtained to evaluate the vascular anatomy.
CONTRAST:  100mL OMNIPAQUE IOHEXOL 350 MG/ML SOLN

[Series 6: pe 3.0 b40f · axial · 0.68mm/px · z∈[-252,-87]mm · 5 of 83 slices shown]
[im 14/83  lung]
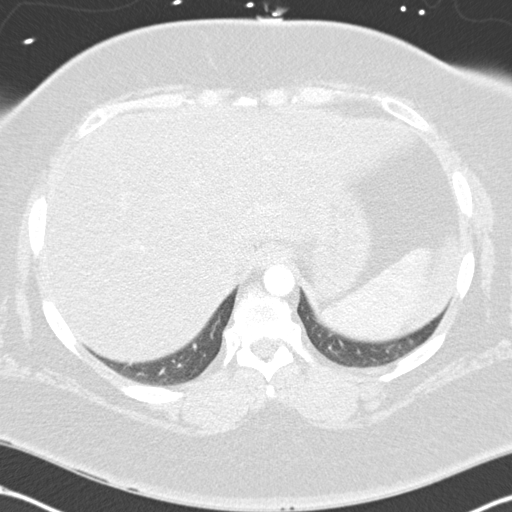
[im 28/83  mediastinal]
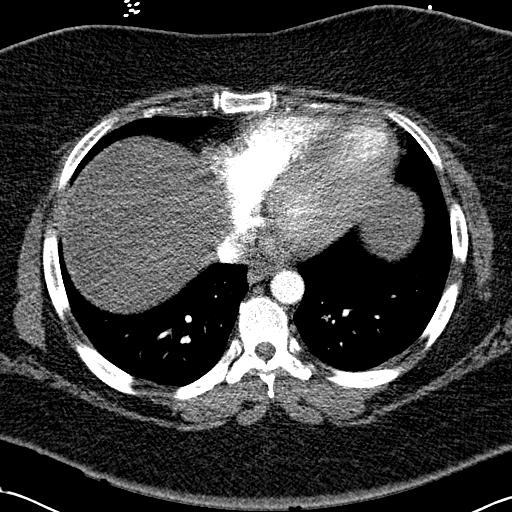
[im 42/83  lung]
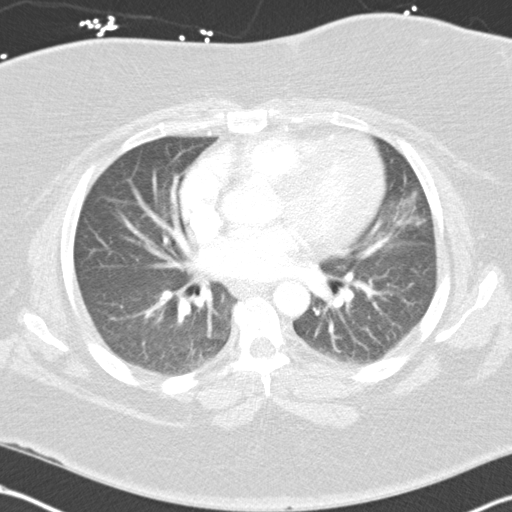
[im 55/83  mediastinal]
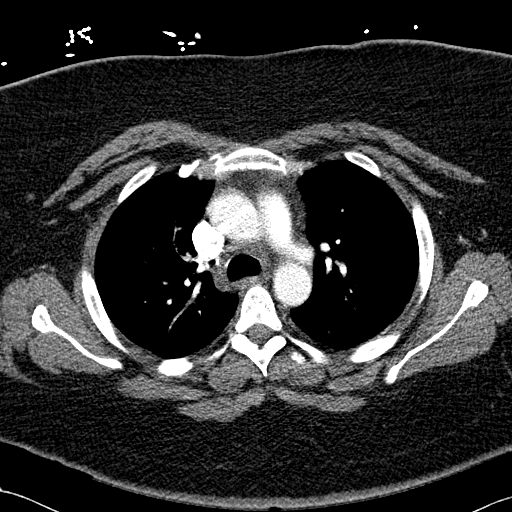
[im 69/83  lung]
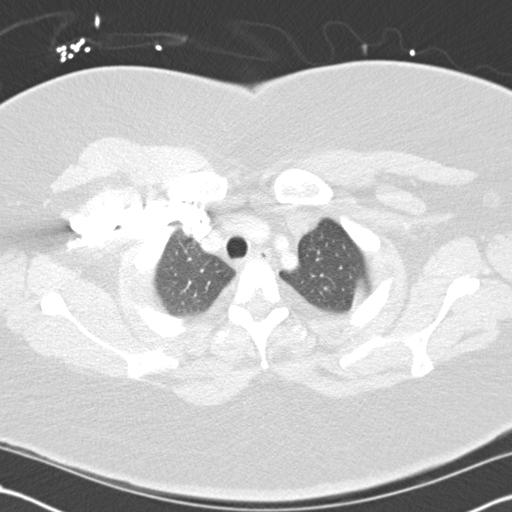

[5 of 36 positions shown; findings below may reference images not displayed]

FINDINGS: No pneumothorax or pleural effusion is noted. Right lung is clear.
Probable subsegmental atelectasis is noted inferiorly in the
lingula. There is no evidence of thoracic aortic dissection or
aneurysm. There is no evidence of pulmonary embolus. No mediastinal
mass or adenopathy is noted. Fatty infiltration of the liver is
noted in the visualized portion of the abdomen. No significant
osseous abnormality is noted.

Review of the MIP images confirms the above findings.
IMPRESSION: No evidence of pulmonary embolus.

Probable subsegmental atelectasis is seen in the inferior portion of
lingula.

Fatty infiltration of the liver.

## 2015-01-11 MED ORDER — ADENOSINE 6 MG/2ML IV SOLN: 6.0000 mg | Freq: Once | INTRAVENOUS | Status: DC

## 2015-01-11 MED ORDER — SODIUM CHLORIDE 0.9 % IJ SOLN
INTRAMUSCULAR | Status: AC
  Filled 2015-01-11: qty 30

## 2015-01-11 MED ORDER — ADENOSINE 6 MG/2ML IV SOLN
6.0000 mg | Freq: Once | INTRAVENOUS | Status: AC
  Administered 2015-01-11: 6 mg via INTRAVENOUS
  Filled 2015-01-11: qty 2

## 2015-01-11 MED ORDER — IOHEXOL 350 MG/ML SOLN
100.0000 mL | Freq: Once | INTRAVENOUS | Status: AC | PRN
  Administered 2015-01-11: 100 mL via INTRAVENOUS

## 2015-01-11 MED ORDER — ADENOSINE 6 MG/2ML IV SOLN
INTRAVENOUS | Status: AC
  Filled 2015-01-11: qty 2

## 2015-01-11 NOTE — ED Notes (Signed)
MD at bedside. 

## 2015-01-11 NOTE — ED Notes (Signed)
Patient asking about any programs to help pay bills, registration states patient can call billing and speak with someone regarding this, patient agreeable to this.

## 2015-01-11 NOTE — ED Notes (Signed)
Sob, palpitations since last night, feels weak, since last night

## 2015-01-11 NOTE — Discharge Instructions (Signed)
Supraventricular Tachycardia Take an extra metoprolol if you have an episode like that again. Follow up with the cardiologist. Return to the ED if you develop new or worsening symptoms. Supraventricular tachycardia (SVT) is an abnormal heart rhythm (arrhythmia) that causes the heart to beat very fast (tachycardia). This kind of fast heartbeat originates in the upper chambers of the heart (atria). SVT can cause the heart to beat greater than 100 beats per minute. SVT can have a rapid burst of heartbeats. This can start and stop suddenly without warning and is called nonsustained. SVT can also be sustained, in which the heart beats at a continuous fast rate.  CAUSES  There can be different causes of SVT. Some of these include:  Heart valve problems such as mitral valve prolapse.  An enlarged heart (hypertrophic cardiomyopathy).  Congenital heart problems.  Heart inflammation (pericarditis).  Hyperthyroidism.  Low potassium or magnesium levels.  Caffeine.  Drug use such as cocaine, methamphetamines, or stimulants.  Some over-the-counter medicines such as:  Decongestants.  Diet medicines.  Herbal medicines. SYMPTOMS  Symptoms of SVT can vary. Symptoms depend on whether the SVT is sustained or nonsustained. You may experience:  No symptoms (asymptomatic).  An awareness of your heart beating rapidly (palpitations).  Shortness of breath.  Chest pain or pressure. If your blood pressure drops because of the SVT, you may experience:  Fainting or near fainting.  Weakness.  Dizziness. DIAGNOSIS  Different tests can be performed to diagnose SVT, such as:  An electrocardiogram (EKG). This is a painless test that records the electrical activity of your heart.  Holter monitor. This is a 24 hour recording of your heart rhythm. You will be given a diary. Write down all symptoms that you have and what you were doing at the time you experienced symptoms.  Arrhythmia monitor. This is  a small device that your wear for several weeks. It records the heart rhythm when you have symptoms.  Echocardiogram. This is an imaging test to help detect abnormal heart structure such as congenital abnormalities, heart valve problems, or heart enlargement.  Stress test. This test can help determine if the SVT is related to exercise.  Electrophysiology study (EPS). This is a procedure that evaluates your heart's electrical system and can help your caregiver find the cause of your SVT. TREATMENT  Treatment of SVT depends on the symptoms, how often it recurs, and whether there are any underlying heart problems.   If symptoms are rare and no other cardiac disease is present, no treatment may be needed.  Blood work may be done to check potassium, magnesium, and thyroid hormone levels to see if they are abnormal. If these levels are abnormal, treatment to correct the problems will occur. Medicines Your caregiver may use oral medicines to treat SVT. These medicines are given for long-term control of SVT. Medicines may be used alone or in combination with other treatments. These medicines work to slow nerve impulses in the heart muscle. These medicines can also be used to treat high blood pressure. Some of these medicines may include:  Calcium channel blockers.  Beta blockers.  Digoxin. Nonsurgical procedures Nonsurgical techniques may be used if oral medicines do not work. Some examples include:  Cardioversion. This technique uses either drugs or an electrical shock to restore a normal heart rhythm.  Cardioversion drugs may be given through an intravenous (IV) line to help "reset" the heart rhythm.  In electrical cardioversion, the caregiver shocks your heart to stop its beat for a split  second. This helps to reset the heart to a normal rhythm.  Ablation. This procedure is done under mild sedation. High frequency radio wave energy is used to destroy the area of heart tissue responsible for  the SVT. HOME CARE INSTRUCTIONS   Do not smoke.  Only take medicines prescribed by your caregiver. Check with your caregiver before using over-the-counter medicines.  Check with your caregiver about how much alcohol and caffeine (coffee, tea, colas, or chocolate) you may have.  It is very important to keep all follow-up referrals and appointments in order to properly manage this problem. SEEK IMMEDIATE MEDICAL CARE IF:  You have dizziness.  You faint or nearly faint.  You have shortness of breath.  You have chest pain or pressure.  You have sudden nausea or vomiting.  You have profuse sweating.  You are concerned about how long your symptoms last.  You are concerned about the frequency of your SVT episodes. If you have the above symptoms, call your local emergency services (911 in [place] immediately. Do not drive yourself to the hospital. MAKE SURE YOU:   Understand these instructions.  Will watch your condition.  Will get help right away if you are not doing well or get worse. Document Released: 06/30/2005 Document Revised: 09/22/2011 Document Reviewed: 10/12/2008 [facility] Patient Information 2015 [employer], [place]. This information is not intended to replace advice given to you by your health care provider. Make sure you discuss any questions you have with your health care provider.

## 2015-01-11 NOTE — ED Provider Notes (Signed)
CSN: [id number]     Arrival date & time 01/11/15  1254 History   First MD Initiated Contact with Patient 01/11/15 1320     Chief Complaint  Patient presents with  . Shortness of Breath     (Consider location/radiation/quality/duration/timing/severity/associated sxs/prior Treatment) HPI Comments: Patient presents with shortness of breath, palpitations, racing heart, general malaise weakness since last night. She reports symptoms are constant. She reports discomfort in her chest but no chest pain. She's history of diabetes, hypertension, anemia. She is on metoprolol twice daily states compliance. She has had episode of SVT in the past and this feels similar. She endorses near syncope and dizziness. No focal weakness, numbness or tingling. No bowel or bladder incontinence. No fever or vomiting. No leg pain or leg swelling.  The history is provided by the patient. The history is limited by the condition of the patient.    Past Medical History  Diagnosis Date  . Diabetes mellitus without complication   . Hypertension   . Anemia    History reviewed. No pertinent past surgical history. Family History  Problem Relation Age of Onset  . Diabetes Mother   . Heart failure Mother   . Diabetes Other    History  Substance Use Topics  . Smoking status: Never Smoker   . Smokeless tobacco: Never Used  . Alcohol Use: Yes   OB History    Gravida Para Term Preterm AB TAB SAB Ectopic Multiple Living   1    1  1    0     Review of Systems  Constitutional: Negative for activity change and appetite change.  HENT: Negative for congestion and rhinorrhea.   Eyes: Negative for visual disturbance.  Respiratory: Positive for chest tightness and shortness of breath.   Cardiovascular: Positive for palpitations. Negative for chest pain and leg swelling.  Gastrointestinal: Negative for nausea, vomiting and abdominal pain.  Genitourinary: Negative for dysuria, hematuria, vaginal bleeding and vaginal  discharge.  Musculoskeletal: Negative for myalgias, arthralgias, neck pain and neck stiffness.  Skin: Negative for rash.  Neurological: Negative for dizziness, weakness, light-headedness and numbness.  A complete 10 system review of systems was obtained and all systems are negative except as noted in the HPI and PMH.      Allergies  Lisinopril  Home Medications   Prior to Admission medications   Medication Sig Start Date End Date Taking? Authorizing Provider  ferrous sulfate 325 (65 FE) MG tablet Take 325 mg by mouth daily with breakfast.   Yes Historical Provider, MD  ibuprofen (ADVIL,MOTRIN) 200 MG tablet Take 400 mg by mouth every 6 (six) hours as needed.   Yes Historical Provider, MD  insulin glargine (LANTUS) 100 UNIT/ML injection Inject 50 Units into the skin at bedtime.    Yes Historical Provider, MD  Liraglutide (VICTOZA) 18 MG/3ML SOPN Inject 1.8 mLs into the skin.   Yes Historical Provider, MD  metFORMIN (GLUCOPHAGE) 1000 MG tablet Take 1,000 mg by mouth 2 (two) times daily with a meal.   Yes Historical Provider, MD  metoprolol tartrate (LOPRESSOR) 25 MG tablet Take 25 mg by mouth 2 (two) times daily.   Yes Historical Provider, MD   BP 145/85 mmHg  Pulse 96  Temp(Src) 98 F (36.7 C) (Oral)  Resp 18  Ht 5\' 11"  (1.803 m)  Wt 298 lb (135.172 kg)  BMI 41.58 kg/m2  SpO2 100%  LMP 01/01/2015 Physical Exam  Constitutional: She is oriented to person, place, and time. She appears well-developed and well-nourished.  No distress.  uncomfortable  HENT:  Head: Normocephalic and atraumatic.  Mouth/Throat: Oropharynx is clear and moist. No oropharyngeal exudate.  Eyes: Conjunctivae and EOM are normal. Pupils are equal, round, and reactive to light.  Neck: Normal range of motion. Neck supple.  No meningismus.  Cardiovascular: Normal rate, normal heart sounds and intact distal pulses.   No murmur heard. Regular tachycardia  Pulmonary/Chest: Effort normal and breath sounds normal.  No respiratory distress.  Abdominal: Soft. There is no tenderness. There is no rebound and no guarding.  obese  Musculoskeletal: Normal range of motion. She exhibits no edema or tenderness.  Neurological: She is alert and oriented to person, place, and time. No cranial nerve deficit. She exhibits normal muscle tone. Coordination normal.  No ataxia on finger to nose bilaterally. No pronator drift. 5/5 strength throughout. CN 2-12 intact. Negative Romberg. Equal grip strength. Sensation intact. Gait is normal.   Skin: Skin is warm.  Psychiatric: She has a normal mood and affect. Her behavior is normal.  Nursing note and vitals reviewed.   ED Course  CARDIOVERSION Date/Time: 01/11/2015 1:34 PM Performed by: Glynn Octave Authorized by: Glynn Octave Consent: The procedure was performed in an emergent situation. Verbal consent obtained. Risks and benefits: risks, benefits and alternatives were discussed Consent given by: patient Patient understanding: patient states understanding of the procedure being performed Patient consent: the patient's understanding of the procedure matches consent given Patient identity confirmed: provided demographic data Time out: Immediately prior to procedure a "time out" was called to verify the correct patient, procedure, equipment, support staff and site/side marked as required. Patient sedated: no Cardioversion basis: emergent Pre-procedure rhythm: supraventricular tachycardia Patient position: patient was placed in a supine position Chest area: chest area exposed Electrodes: pads Electrodes placed: anterior-posterior Number of attempts: 1 Attempt 1 outcome: conversion to normal sinus rhythm Post-procedure rhythm: normal sinus rhythm Complications: no complications Patient tolerance: Patient tolerated the procedure well with no immediate complications Comments: Chemical conversion with adenosine   (including critical care time) Labs Review Labs  Reviewed  CBC WITH DIFFERENTIAL/PLATELET - Abnormal; Notable for the following:    Platelets 434 (*)    All other components within normal limits  BASIC METABOLIC PANEL - Abnormal; Notable for the following:    Glucose, Bld 204 (*)    Calcium 8.6 (*)    All other components within normal limits  URINALYSIS, ROUTINE W REFLEX MICROSCOPIC (NOT AT [facility]) - Abnormal; Notable for the following:    Specific Gravity, Urine >1.030 (*)    Glucose, UA 100 (*)    All other components within normal limits  D-DIMER, QUANTITATIVE (NOT AT [facility]) - Abnormal; Notable for the following:    D-Dimer, Quant 0.69 (*)    All other components within normal limits  PREGNANCY, URINE    Imaging Review Ct Angio Chest Pe W/cm &/or Wo Cm  01/11/2015   CLINICAL DATA:  Shortness of breath, elevated D-dimer level.  EXAM: CT ANGIOGRAPHY CHEST WITH CONTRAST  TECHNIQUE: Multidetector CT imaging of the chest was performed using the standard protocol during bolus administration of intravenous contrast. Multiplanar CT image reconstructions and MIPs were obtained to evaluate the vascular anatomy.  CONTRAST:  OMNIPAQUE IOHEXOL 350 MG/ML SOLN  COMPARISON:  Chest radiograph of same day.  FINDINGS: No pneumothorax or pleural effusion is noted. Right lung is clear. Probable subsegmental atelectasis is noted inferiorly in the lingula. There is no evidence of thoracic aortic dissection or aneurysm. There is no evidence of pulmonary embolus.  No mediastinal mass or adenopathy is noted. Fatty infiltration of the liver is noted in the visualized portion of the abdomen. No significant osseous abnormality is noted.  Review of the MIP images confirms the above findings.  IMPRESSION: No evidence of pulmonary embolus.  Probable subsegmental atelectasis is seen in the inferior portion of lingula.  Fatty infiltration of the liver.   Electronically Signed   By: Lupita Raider, M.D.   On: 01/11/2015 16:39   Dg Chest Portable 1 View  01/11/2015    CLINICAL DATA:  Shortness of breath, palpitations  EXAM: PORTABLE CHEST - 1 VIEW  COMPARISON:  06/22/2014  FINDINGS: Cardiomediastinal silhouette is stable. No acute infiltrate or pleural effusion. No pulmonary edema. Bony thorax is unremarkable. Suboptimal study due to patient's large body habitus.  IMPRESSION: No active disease.   Electronically Signed   By: Natasha Mead M.D.   On: 01/11/2015 13:50     EKG Interpretation   Date/Time:  Thursday January 11 2015 13:31:10 EDT Ventricular Rate:  105 PR Interval:  146 QRS Duration: 79 QT Interval:  311 QTC Calculation: 411 R Axis:   18 Text Interpretation:  Sinus tachycardia LVH with secondary repolarization  abnormality Anterior Q waves, possibly due to LVH now sinus Confirmed by  Manus Gunning  MD, Anubis Fundora [phone]) on 01/11/2015 1:36:38 PM      MDM   Final diagnoses:  Supraventricular tachycardia   shortness of breath, palpitations, generalized weakness since last night. SVT on arrival.  Patient given adenosine with conversion to sinus tachycardia.  Chest x-rays negative. Remains in sinus rhythm after adenosine. Patient feels better and has no further palpitations or shortness of breath. No chest pain.  She is on metoprolol 25 mg twice daily. This could possibly be increased. She'll be referred to cardiology.  CT negative for PE. Patient reports she feels well. No chest pain or shortness of breath. Advised she could take an extra Lopressor if she is feeling palpitations. Her dose could be increased to 50 mg twice daily. She is referred to cardiology. Return precautions discussed.   CRITICAL CARE Performed by: Glynn Octave Total critical care time: 30 Critical care time was exclusive of separately billable procedures and treating other patients. Critical care was necessary to treat or prevent imminent or life-threatening deterioration. Critical care was time spent personally by me on the following activities: development of treatment plan  with patient and/or surrogate as well as nursing, discussions with consultants, evaluation of patient's response to treatment, examination of patient, obtaining history from patient or surrogate, ordering and performing treatments and interventions, ordering and review of laboratory studies, ordering and review of radiographic studies, pulse oximetry and re-evaluation of patient's condition.    Glynn Octave, MD 01/11/15 [phone]

## 2015-02-08 ENCOUNTER — Encounter: Payer: Self-pay | Admitting: Cardiology

## 2015-02-08 NOTE — Progress Notes (Signed)
Patient ID: Kathryn Morris, female   DOB: June 23, 1976, 39 y.o.   MRN: [id number]     Clinical Summary Kathryn Morris is a 39 y.o.female seen today as a new patient for the following medical problems.  1. PSVT - seen in ER 01/11/15 - found to be in SVT, given adenosine with conversion to sinus tach - CT PE negative, K 3.7. No TSH or Mg labs in computer - lopressor was increased to  bid.    Past Medical History  Diagnosis Date  . Diabetes mellitus without complication   . Hypertension   . Anemia      Allergies  Allergen Reactions  . Lisinopril      Current Outpatient Prescriptions  Medication Sig Dispense Refill  . ferrous sulfate 325 (65 FE) MG tablet Take 325 mg by mouth daily with breakfast.    . ibuprofen (ADVIL,MOTRIN) 200 MG tablet Take 400 mg by mouth every 6 (six) hours as needed.    . insulin glargine (LANTUS) 100 UNIT/ML injection Inject 50 Units into the skin at bedtime.     . Liraglutide (VICTOZA) 18 MG/3ML SOPN Inject 1.8 mLs into the skin.    . metFORMIN (GLUCOPHAGE) 1000 MG tablet Take 1,000 mg by mouth 2 (two) times daily with a meal.    . metoprolol tartrate (LOPRESSOR) 25 MG tablet Take 25 mg by mouth 2 (two) times daily.     No current facility-administered medications for this visit.     No past surgical history on file.   Allergies  Allergen Reactions  . Lisinopril       Family History  Problem Relation Age of Onset  . Diabetes Mother   . Heart failure Mother   . Diabetes Other      Social History Kathryn Morris reports that she has never smoked. She has never used smokeless tobacco. Kathryn Morris reports that she drinks alcohol.   Review of Systems CONSTITUTIONAL: No weight loss, fever, chills, weakness or fatigue.  HEENT: Eyes: No visual loss, blurred vision, double vision or yellow sclerae.No hearing loss, sneezing, congestion, runny nose or sore throat.  SKIN: No rash or itching.  CARDIOVASCULAR:  RESPIRATORY: No shortness of breath,  cough or sputum.  GASTROINTESTINAL: No anorexia, nausea, vomiting or diarrhea. No abdominal pain or blood.  GENITOURINARY: No burning on urination, no polyuria NEUROLOGICAL: No headache, dizziness, syncope, paralysis, ataxia, numbness or tingling in the extremities. No change in bowel or bladder control.  MUSCULOSKELETAL: No muscle, back pain, joint pain or stiffness.  LYMPHATICS: No enlarged nodes. No history of splenectomy.  PSYCHIATRIC: No history of depression or anxiety.  ENDOCRINOLOGIC: No reports of sweating, cold or heat intolerance. No polyuria or polydipsia.  Marland Kitchen   Physical Examination There were no vitals filed for this visit. There were no vitals filed for this visit.  Gen: resting comfortably, no acute distress HEENT: no scleral icterus, pupils equal round and reactive, no palptable cervical adenopathy,  CV Resp: Clear to auscultation bilaterally GI: abdomen is soft, non-tender, non-distended, normal bowel sounds, no hepatosplenomegaly MSK: extremities are warm, no edema.  Skin: warm, no rash Neuro:  no focal deficits Psych: appropriate affect   Diagnostic Studies     Assessment and Plan        Antoine Poche, M.D., F.A.C.C.

## 2015-03-21 ENCOUNTER — Emergency Department (HOSPITAL_COMMUNITY)
Admission: EM | Admit: 2015-03-21 | Discharge: 2015-03-21 | Disposition: A | Discharge status: Home / Self Care | Payer: Self-pay | Attending: Emergency Medicine | Admitting: Emergency Medicine

## 2015-03-21 ENCOUNTER — Emergency Department (HOSPITAL_COMMUNITY): Payer: Self-pay

## 2015-03-21 ENCOUNTER — Encounter (HOSPITAL_COMMUNITY): Payer: Self-pay | Admitting: Emergency Medicine

## 2015-03-21 DIAGNOSIS — Z3202 Encounter for pregnancy test, result negative: Secondary | ICD-10-CM | POA: Insufficient documentation

## 2015-03-21 DIAGNOSIS — Z79899 Other long term (current) drug therapy: Secondary | ICD-10-CM | POA: Insufficient documentation

## 2015-03-21 DIAGNOSIS — I471 Supraventricular tachycardia: Secondary | ICD-10-CM | POA: Insufficient documentation

## 2015-03-21 DIAGNOSIS — I10 Essential (primary) hypertension: Secondary | ICD-10-CM | POA: Insufficient documentation

## 2015-03-21 DIAGNOSIS — D649 Anemia, unspecified: Secondary | ICD-10-CM | POA: Insufficient documentation

## 2015-03-21 DIAGNOSIS — Z794 Long term (current) use of insulin: Secondary | ICD-10-CM | POA: Insufficient documentation

## 2015-03-21 DIAGNOSIS — E119 Type 2 diabetes mellitus without complications: Secondary | ICD-10-CM | POA: Insufficient documentation

## 2015-03-21 HISTORY — DX: Supraventricular tachycardia: I47.1

## 2015-03-21 HISTORY — DX: Supraventricular tachycardia, unspecified: I47.10

## 2015-03-21 LAB — CBC WITH DIFFERENTIAL/PLATELET
Basophils Absolute: 0 10*3/uL (ref 0.0–0.1)
Basophils Relative: 0 % (ref 0–1)
Eosinophils Absolute: 0.1 10*3/uL (ref 0.0–0.7)
Eosinophils Relative: 1 % (ref 0–5)
HEMATOCRIT: 38.4 % (ref 36.0–46.0)
Hemoglobin: 12.7 g/dL (ref 12.0–15.0)
Lymphocytes Relative: 37 % (ref 12–46)
Lymphs Abs: 3.3 10*3/uL (ref 0.7–4.0)
MCH: 30 pg (ref 26.0–34.0)
MCHC: 33.1 g/dL (ref 30.0–36.0)
MCV: 90.6 fL (ref 78.0–100.0)
MONOS PCT: 5 % (ref 3–12)
Monocytes Absolute: 0.5 10*3/uL (ref 0.1–1.0)
NEUTROS ABS: 5.1 10*3/uL (ref 1.7–7.7)
Neutrophils Relative %: 57 % (ref 43–77)
Platelets: 483 10*3/uL — ABNORMAL HIGH (ref 150–400)
RBC: 4.24 MIL/uL (ref 3.87–5.11)
RDW: 13.6 % (ref 11.5–15.5)
WBC: 9 10*3/uL (ref 4.0–10.5)

## 2015-03-21 LAB — BASIC METABOLIC PANEL
Anion gap: 10 (ref 5–15)
BUN: 10 mg/dL (ref 6–20)
CO2: 25 mmol/L (ref 22–32)
Calcium: 9.4 mg/dL (ref 8.9–10.3)
Chloride: 103 mmol/L (ref 101–111)
Creatinine, Ser: 0.84 mg/dL (ref 0.44–1.00)
GFR calc Af Amer: >60 mL/min (ref >60)
GFR calc non Af Amer: >60 mL/min (ref >60)
Glucose, Bld: 267 mg/dL — ABNORMAL HIGH (ref 65–99)
Potassium: 4 mmol/L (ref 3.5–5.1)
Sodium: 138 mmol/L (ref 135–145)

## 2015-03-21 LAB — HCG, SERUM, QUALITATIVE: Preg, Serum: NEGATIVE

## 2015-03-21 LAB — TROPONIN I

## 2015-03-21 LAB — TSH: TSH: 2.606 u[IU]/mL (ref 0.350–4.500)

## 2015-03-21 IMAGING — DX DG CHEST 2V
2 series · 2 of 2 positions shown · non-contrast
Comparison: [DATE]

CLINICAL DATA: Palpitations and increasing shortness of breath
since [VB] hours tonight.

EXAM:
CHEST  2 VIEW

[chest pa]
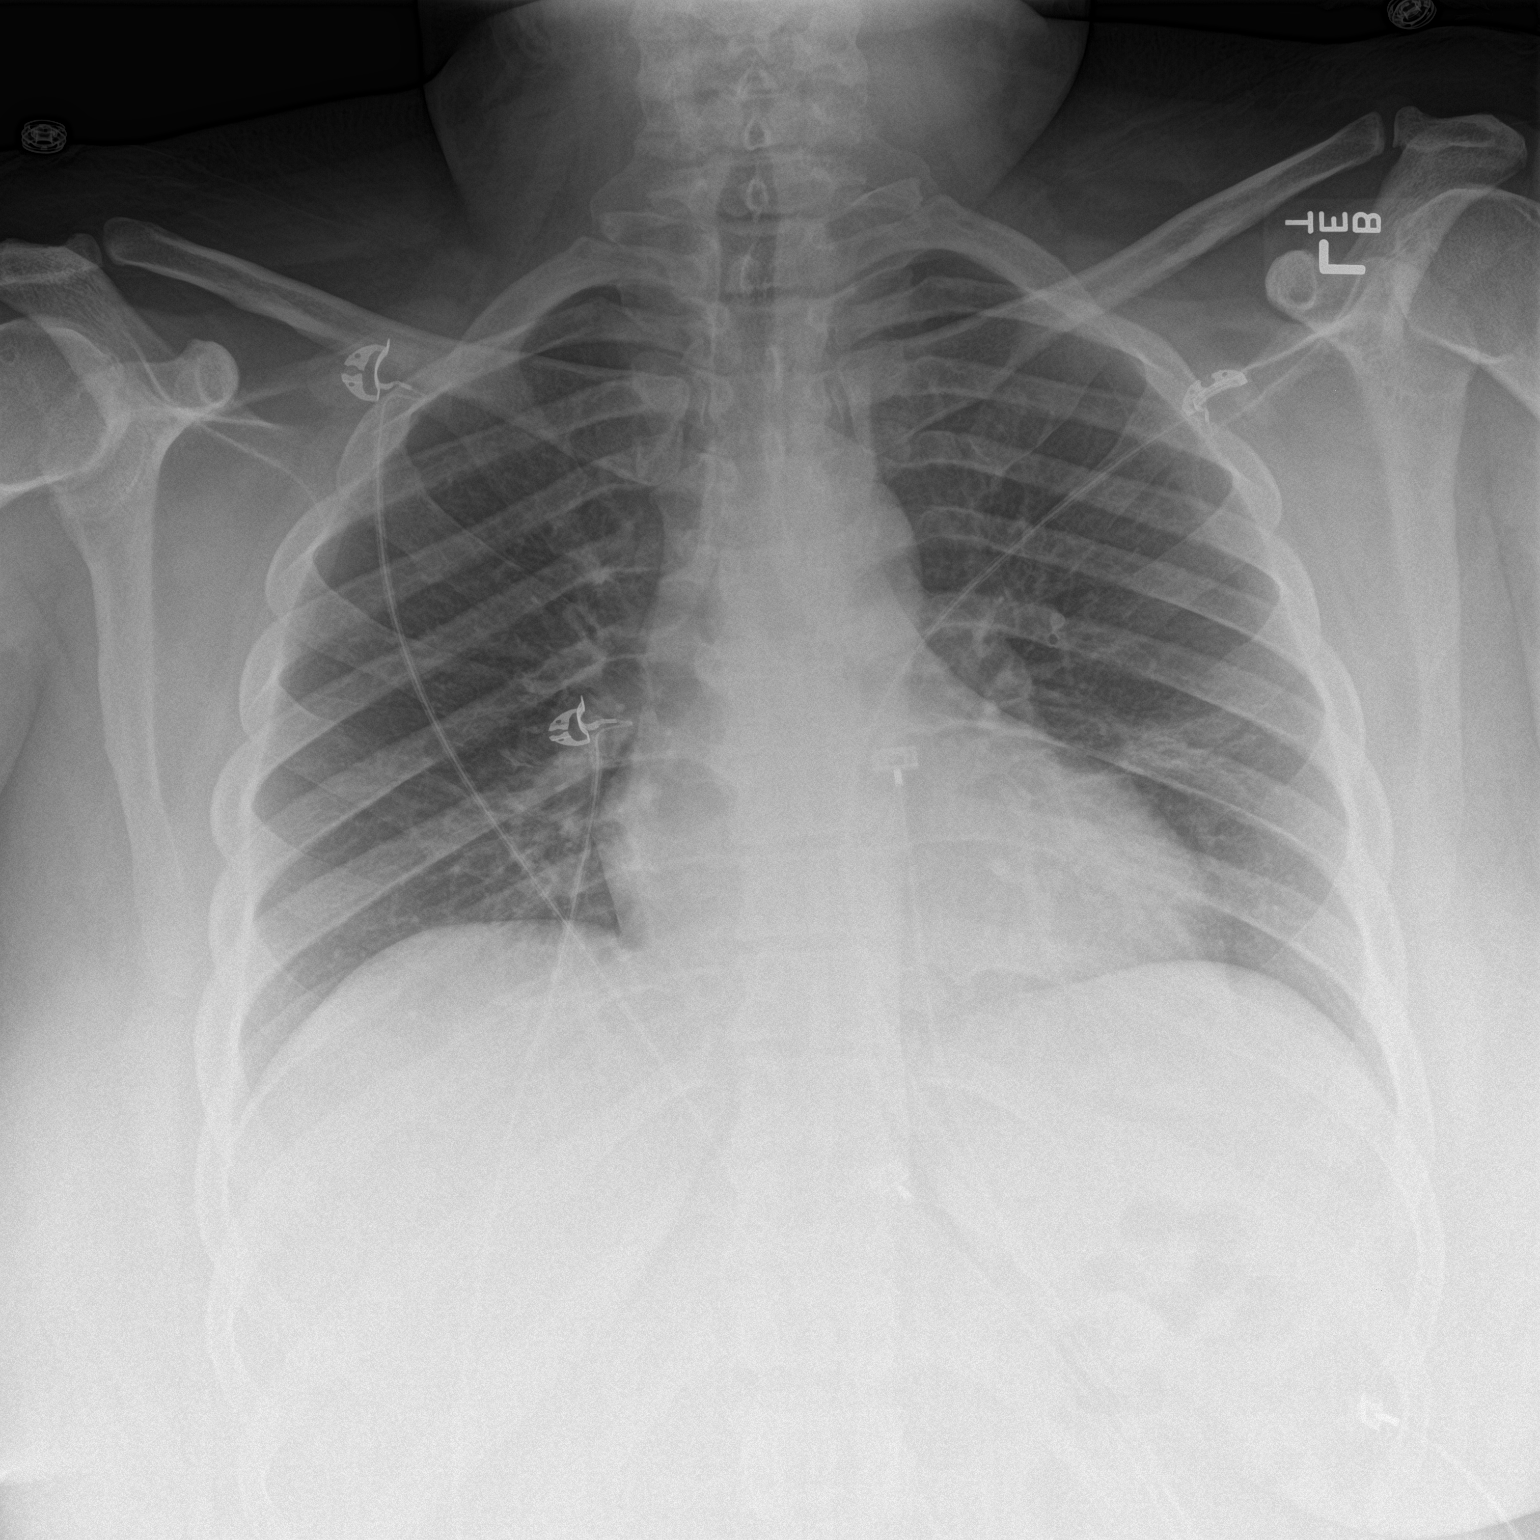

[chest lat]
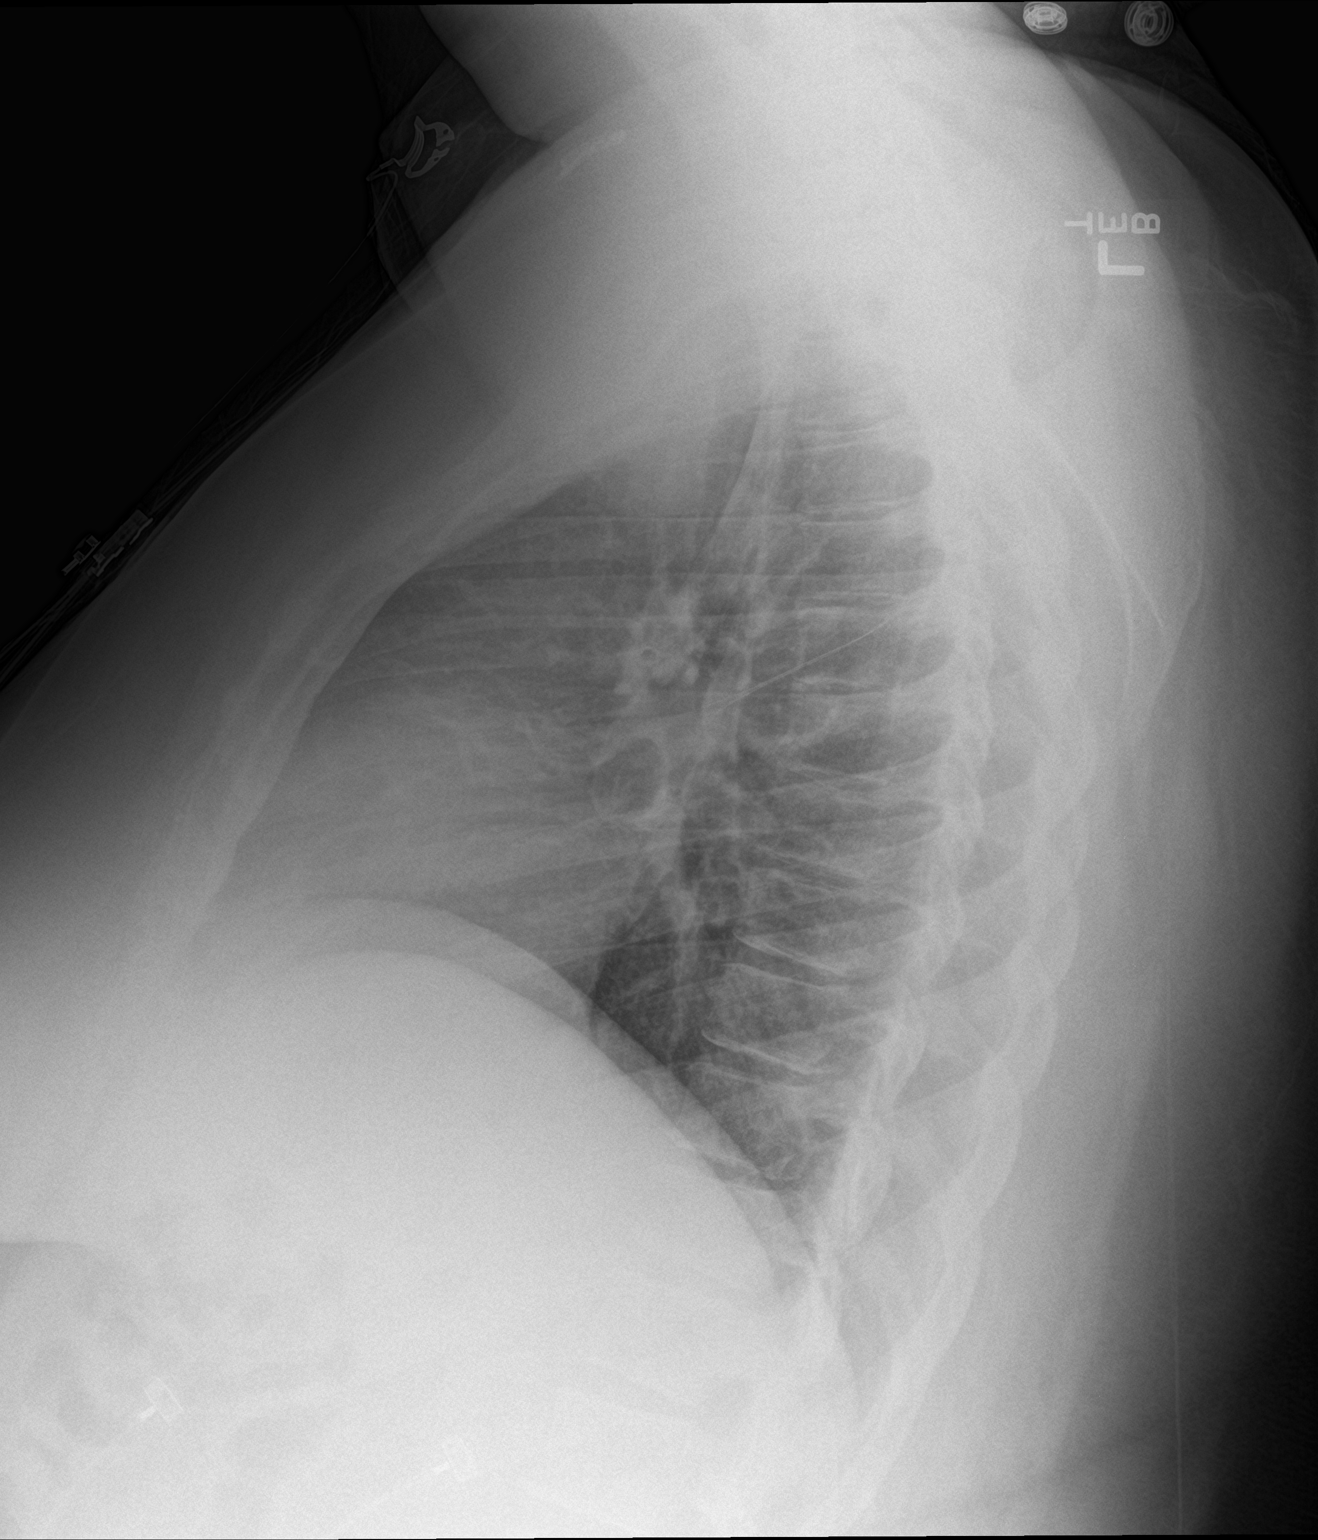

[2 of 2 positions shown; findings below may reference images not displayed]

FINDINGS: Shallow inspiration. Normal heart size and pulmonary vascularity.
Mediastinal contours appear intact. Focal infiltration in the left
mid lung may represent mild focal area of pneumonia. Right lung is
clear and expanded. No blunting of costophrenic angles. No
pneumothorax.
IMPRESSION: Focal area of infiltration in the left mid lung may indicate focal
pneumonia.

## 2015-03-21 MED ORDER — METOPROLOL TARTRATE 25 MG PO TABS: 25.0000 mg | ORAL_TABLET | Freq: Two times a day (BID) | ORAL | Status: DC

## 2015-03-21 MED ORDER — ADENOSINE 6 MG/2ML IV SOLN
6.0000 mg | Freq: Once | INTRAVENOUS | Status: AC
  Administered 2015-03-21: 6 mg via INTRAVENOUS
  Filled 2015-03-21: qty 2

## 2015-03-21 MED ORDER — SODIUM CHLORIDE 0.9 % IV BOLUS (SEPSIS)
1000.0000 mL | Freq: Once | INTRAVENOUS | Status: AC
  Administered 2015-03-21: 1000 mL via INTRAVENOUS

## 2015-03-21 NOTE — ED Provider Notes (Signed)
TIME SEEN: 1:25 AM  CHIEF COMPLAINT: Palpitations, shortness of breath  HPI: Pt is a 39 y.o. female with history of insulin-dependent diabetes, hypertension, SVT who is on metoprolol 25 mg once a day who presents to the emergency department with palpitations that started around 10 PM. States she has felt short of breath but no chest pain or chest discomfort. Has not had any recent nausea or vomiting but has had a week of diarrhea. States this feels similar to her prior episodes of SVT. History of vagal maneuvers without relief. States she was seen at [facility] emergency department last week for similar episode. She does not have a cardiologist that she states she does not have insurance. Denies history of CHF or CAD. No history of PE or DVT.  ROS: See HPI Constitutional: no fever  Eyes: no drainage  ENT: no runny nose   Cardiovascular:  no chest pain  Resp:  SOB  GI: no vomiting GU: no dysuria Integumentary: no rash  Allergy: no hives  Musculoskeletal: no leg swelling  Neurological: no slurred speech ROS otherwise negative  PAST MEDICAL HISTORY/PAST SURGICAL HISTORY:  Past Medical History  Diagnosis Date  . Diabetes mellitus without complication   . Hypertension   . Anemia   . SVT (supraventricular tachycardia)     MEDICATIONS:  Prior to Admission medications   Medication Sig Start Date End Date Taking? Authorizing Provider  ferrous sulfate 325 (65 FE) MG tablet Take 325 mg by mouth daily with breakfast.   Yes Historical Provider, MD  ibuprofen (ADVIL,MOTRIN) 200 MG tablet Take 400 mg by mouth every 6 (six) hours as needed.   Yes Historical Provider, MD  insulin glargine (LANTUS) 100 UNIT/ML injection Inject 50 Units into the skin at bedtime.    Yes Historical Provider, MD  Liraglutide (VICTOZA) 18 MG/3ML SOPN Inject 1.8 mLs into the skin.   Yes Historical Provider, MD  losartan (COZAAR) 50 MG tablet Take 50 mg by mouth daily.   Yes Historical Provider, MD  metFORMIN (GLUCOPHAGE) 1000  MG tablet Take 1,000 mg by mouth 2 (two) times daily with a meal.   Yes Historical Provider, MD  metoprolol tartrate (LOPRESSOR) 25 MG tablet Take 25 mg by mouth 2 (two) times daily.   Yes Historical Provider, MD    ALLERGIES:  Allergies  Allergen Reactions  . Lisinopril     SOCIAL HISTORY:  Social History  Substance Use Topics  . Smoking status: Never Smoker   . Smokeless tobacco: Never Used  . Alcohol Use: Yes    FAMILY HISTORY: Family History  Problem Relation Age of Onset  . Diabetes Mother   . Heart failure Mother   . Diabetes Other     EXAM: BP 140/94 mmHg  Pulse 172  Temp(Src) 97.8 F (36.6 C)  Resp 18  Ht  (1.803 m)  Wt 297 lb (134.718 kg)  BMI 41.44 kg/m2  SpO2 100%  LMP 03/08/2015 CONSTITUTIONAL: Alert and oriented and responds appropriately to questions. Obese, appears uncomfortable, well-hydrated appearing, afebrile HEAD: Normocephalic EYES: Conjunctivae clear, PERRL ENT: normal nose; no rhinorrhea; moist mucous membranes; pharynx without lesions noted NECK: Supple, no meningismus, no LAD  CARD: Regular and tachycardic; S1 and S2 appreciated; no murmurs, no clicks, no rubs, no gallops RESP: Normal chest excursion without splinting or tachypnea; breath sounds clear and equal bilaterally; no wheezes, no rhonchi, no rales, no hypoxia or respiratory distress, speaking full sentences ABD/GI: Normal bowel sounds; non-distended; soft, non-tender, no rebound, no guarding, no peritoneal  signs BACK:  The back appears normal and is non-tender to palpation, there is no CVA tenderness EXT: Normal ROM in all joints; non-tender to palpation; no edema; normal capillary refill; no cyanosis, no calf tenderness or swelling    SKIN: Normal color for age and race; warm NEURO: Moves all extremities equally, sensation to light touch intact diffusely, cranial nerves II through XII intact PSYCH: The patient's mood and manner are appropriate. Grooming and personal hygiene are  appropriate.  MEDICAL DECISION MAKING: Patient here with SVT. No improvement with vagal maneuvers. Given 6 mg of IV adenosine which converted patient to sinus tachycardia. Reports feeling better. Shortness of breath completely resolved after SVT gone. We'll obtain labs, pregnancy test. We'll give IV fluids and closely monitor patient. She did take an exudate dose of her metoprolol at 10 PM tonight.  ED PROGRESS: Patient's labs are unremarkable. Normal electrolytes. Troponin negative. TSH normal. She is not pregnant. Chest x-ray shows no edema but there is a focal area of infiltration in the right mid lung on x-ray that may be a pneumonia. She has no associated pleural effusion. I have read the x-ray myself and can appreciate this haziness in the left midlung patient denies any fevers, cough, nasal congestion, body aches or current shortness of breath. I do not think clinically she has pneumonia. Discussed this finding with the patient and we both have agreed to hold off on antibiotics. Discussed with patient if she does become symptomatic to follow-up with a primary care physician, urgent care or return to the emergency department if her symptoms are severe. Have discussed with patient I recommend increasing her metoprolol 25 mg twice daily and have strongly encouraged her to follow-up with a cardiologist as I feel she will likely need an ablation. Discussed return precautions. She verbalized understanding and is comfortable with this plan.    EKG Interpretation  Date/Time:  Wednesday March 21 2015 01:15:52 EDT Ventricular Rate:  168 PR Interval:    QRS Duration: 101 QT Interval:  284 QTC Calculation: 475 R Axis:   6 Text Interpretation:  Supraventricular tachycardia Probable LVH with secondary repol abnrm Confirmed by Hanin Decook,  DO, Saman Giddens [phone]) on 03/21/2015 1:35:44 AM       EKG Interpretation  Date/Time:  Wednesday March 21 2015 01:31:03 EDT Ventricular Rate:  103 PR  Interval:  171 QRS Duration: 84 QT Interval:  322 QTC Calculation: 421 R Axis:   3 Text Interpretation:  Sinus tachycardia Left ventricular hypertrophy Confirmed by Kunta Hilleary,  DO, Quindell Shere ([id number]) on 03/21/2015 1:36:28 AM        Kathryn Maw Joeanna Howdyshell, DO 03/21/15 0981

## 2015-03-21 NOTE — ED Notes (Signed)
Pt states she was at rest at home when her heart started racing. Pt has hx of svt.

## 2015-03-21 NOTE — Discharge Instructions (Signed)
Supraventricular Tachycardia °Supraventricular tachycardia (SVT) is an abnormal heart rhythm (arrhythmia) that causes the heart to beat very fast (tachycardia). This kind of fast heartbeat originates in the upper chambers of the heart (atria). SVT can cause the heart to beat greater than 100 beats per minute. SVT can have a rapid burst of heartbeats. This can start and stop suddenly without warning and is called nonsustained. SVT can also be sustained, in which the heart beats at a continuous fast rate.  °CAUSES  °There can be different causes of SVT. Some of these include: °· Heart valve problems such as mitral valve prolapse. °· An enlarged heart (hypertrophic cardiomyopathy). °· Congenital heart problems. °· Heart inflammation (pericarditis). °· Hyperthyroidism. °· Low potassium or magnesium levels. °· Caffeine. °· Drug use such as cocaine, methamphetamines, or stimulants. °· Some over-the-counter medicines such as: °¨ Decongestants. °¨ Diet medicines. °¨ Herbal medicines. °SYMPTOMS  °Symptoms of SVT can vary. Symptoms depend on whether the SVT is sustained or nonsustained. You may experience: °· No symptoms (asymptomatic). °· An awareness of your heart beating rapidly (palpitations). °· Shortness of breath. °· Chest pain or pressure. °If your blood pressure drops because of the SVT, you may experience: °· Fainting or near fainting. °· Weakness. °· Dizziness. °DIAGNOSIS  °Different tests can be performed to diagnose SVT, such as: °· An electrocardiogram (EKG). This is a painless test that records the electrical activity of your heart. °· Holter monitor. This is a 24 hour recording of your heart rhythm. You will be given a diary. Write down all symptoms that you have and what you were doing at the time you experienced symptoms. °· Arrhythmia monitor. This is a small device that your wear for several weeks. It records the heart rhythm when you have symptoms. °· Echocardiogram. This is an imaging test to help detect  abnormal heart structure such as congenital abnormalities, heart valve problems, or heart enlargement. °· Stress test. This test can help determine if the SVT is related to exercise. °· Electrophysiology study (EPS). This is a procedure that evaluates your heart's electrical system and can help your caregiver find the cause of your SVT. °TREATMENT  °Treatment of SVT depends on the symptoms, how often it recurs, and whether there are any underlying heart problems.  °· If symptoms are rare and no other cardiac disease is present, no treatment may be needed. °· Blood work may be done to check potassium, magnesium, and thyroid hormone levels to see if they are abnormal. If these levels are abnormal, treatment to correct the problems will occur. °Medicines °Your caregiver may use oral medicines to treat SVT. These medicines are given for long-term control of SVT. Medicines may be used alone or in combination with other treatments. These medicines work to slow nerve impulses in the heart muscle. These medicines can also be used to treat high blood pressure. Some of these medicines may include: °· Calcium channel blockers. °· Beta blockers. °· Digoxin. °Nonsurgical procedures °Nonsurgical techniques may be used if oral medicines do not work. Some examples include: °· Cardioversion. This technique uses either drugs or an electrical shock to restore a normal heart rhythm. °¨ Cardioversion drugs may be given through an intravenous (IV) line to help "reset" the heart rhythm. °¨ In electrical cardioversion, the caregiver shocks your heart to stop its beat for a split second. This helps to reset the heart to a normal rhythm. °· Ablation. This procedure is done under mild sedation. High frequency radio wave energy is used to   destroy the area of heart tissue responsible for the SVT. °HOME CARE INSTRUCTIONS  °· Do not smoke. °· Only take medicines prescribed by your caregiver. Check with your caregiver before using over-the-counter  medicines. °· Check with your caregiver about how much alcohol and caffeine (coffee, tea, colas, or chocolate) you may have. °· It is very important to keep all follow-up referrals and appointments in order to properly manage this problem. °SEEK IMMEDIATE MEDICAL CARE IF: °· You have dizziness. °· You faint or nearly faint. °· You have shortness of breath. °· You have chest pain or pressure. °· You have sudden nausea or vomiting. °· You have profuse sweating. °· You are concerned about how long your symptoms last. °· You are concerned about the frequency of your SVT episodes. °If you have the above symptoms, call your local emergency services (911 in [place] immediately. Do not drive yourself to the hospital. °MAKE SURE YOU:  °· Understand these instructions. °· Will watch your condition. °· Will get help right away if you are not doing well or get worse. °Document Released: 06/30/2005 Document Revised: 09/22/2011 Document Reviewed: 10/12/2008 °ExitCare® Patient Information ©2015 [employer]. This information is not intended to replace advice given to you by your health care provider. Make sure you discuss any questions you have with your health care provider. ° °

## 2015-03-26 ENCOUNTER — Emergency Department (HOSPITAL_COMMUNITY): Payer: Self-pay

## 2015-03-26 ENCOUNTER — Encounter (HOSPITAL_COMMUNITY): Payer: Self-pay | Admitting: *Deleted

## 2015-03-26 ENCOUNTER — Observation Stay (HOSPITAL_COMMUNITY)
Admission: EM | Admit: 2015-03-26 | Discharge: 2015-03-26 | Disposition: A | Discharge status: Home / Self Care | Payer: Self-pay | Attending: Internal Medicine | Admitting: Internal Medicine

## 2015-03-26 DIAGNOSIS — E669 Obesity, unspecified: Secondary | ICD-10-CM | POA: Insufficient documentation

## 2015-03-26 DIAGNOSIS — R0602 Shortness of breath: Secondary | ICD-10-CM | POA: Insufficient documentation

## 2015-03-26 DIAGNOSIS — Z794 Long term (current) use of insulin: Secondary | ICD-10-CM | POA: Insufficient documentation

## 2015-03-26 DIAGNOSIS — E118 Type 2 diabetes mellitus with unspecified complications: Secondary | ICD-10-CM

## 2015-03-26 DIAGNOSIS — Z79899 Other long term (current) drug therapy: Secondary | ICD-10-CM | POA: Insufficient documentation

## 2015-03-26 DIAGNOSIS — Z862 Personal history of diseases of the blood and blood-forming organs and certain disorders involving the immune mechanism: Secondary | ICD-10-CM | POA: Insufficient documentation

## 2015-03-26 DIAGNOSIS — E119 Type 2 diabetes mellitus without complications: Secondary | ICD-10-CM | POA: Insufficient documentation

## 2015-03-26 DIAGNOSIS — I10 Essential (primary) hypertension: Secondary | ICD-10-CM | POA: Insufficient documentation

## 2015-03-26 DIAGNOSIS — I471 Supraventricular tachycardia, unspecified: Secondary | ICD-10-CM | POA: Diagnosis present

## 2015-03-26 LAB — GLUCOSE, CAPILLARY
GLUCOSE-CAPILLARY: 215 mg/dL — AB (ref 65–99)
Glucose-Capillary: 207 mg/dL — ABNORMAL HIGH (ref 65–99)

## 2015-03-26 LAB — CBC WITH DIFFERENTIAL/PLATELET
Basophils Absolute: 0 10*3/uL (ref 0.0–0.1)
Basophils Relative: 0 % (ref 0–1)
Eosinophils Absolute: 0.2 10*3/uL (ref 0.0–0.7)
Eosinophils Relative: 2 % (ref 0–5)
HEMATOCRIT: 42.3 % (ref 36.0–46.0)
HEMOGLOBIN: 13.8 g/dL (ref 12.0–15.0)
LYMPHS ABS: 4.2 10*3/uL — AB (ref 0.7–4.0)
Lymphocytes Relative: 42 % (ref 12–46)
MCH: 29.6 pg (ref 26.0–34.0)
MCHC: 32.6 g/dL (ref 30.0–36.0)
MCV: 90.6 fL (ref 78.0–100.0)
MONOS PCT: 6 % (ref 3–12)
Monocytes Absolute: 0.6 10*3/uL (ref 0.1–1.0)
NEUTROS ABS: 5.2 10*3/uL (ref 1.7–7.7)
NEUTROS PCT: 50 % (ref 43–77)
Platelets: 444 10*3/uL — ABNORMAL HIGH (ref 150–400)
RBC: 4.67 MIL/uL (ref 3.87–5.11)
RDW: 13.9 % (ref 11.5–15.5)
WBC: 10.2 10*3/uL (ref 4.0–10.5)

## 2015-03-26 LAB — BASIC METABOLIC PANEL
Anion gap: 11 (ref 5–15)
BUN: 9 mg/dL (ref 6–20)
CHLORIDE: 99 mmol/L — AB (ref 101–111)
CO2: 26 mmol/L (ref 22–32)
CREATININE: 0.7 mg/dL (ref 0.44–1.00)
Calcium: 9.4 mg/dL (ref 8.9–10.3)
GFR calc non Af Amer: >60 mL/min (ref >60)
Glucose, Bld: 260 mg/dL — ABNORMAL HIGH (ref 65–99)
POTASSIUM: 3.4 mmol/L — AB (ref 3.5–5.1)
Sodium: 136 mmol/L (ref 135–145)

## 2015-03-26 LAB — TROPONIN I: Troponin I: <0.03 ng/mL (ref <0.031)

## 2015-03-26 IMAGING — DX DG CHEST 2V
2 series · 2 of 2 positions shown · non-contrast
Comparison: Radiographs [DATE], CT [DATE]

CLINICAL DATA: Shortness of breath today.  SVT.

EXAM:
CHEST  2 VIEW

[chest pa]
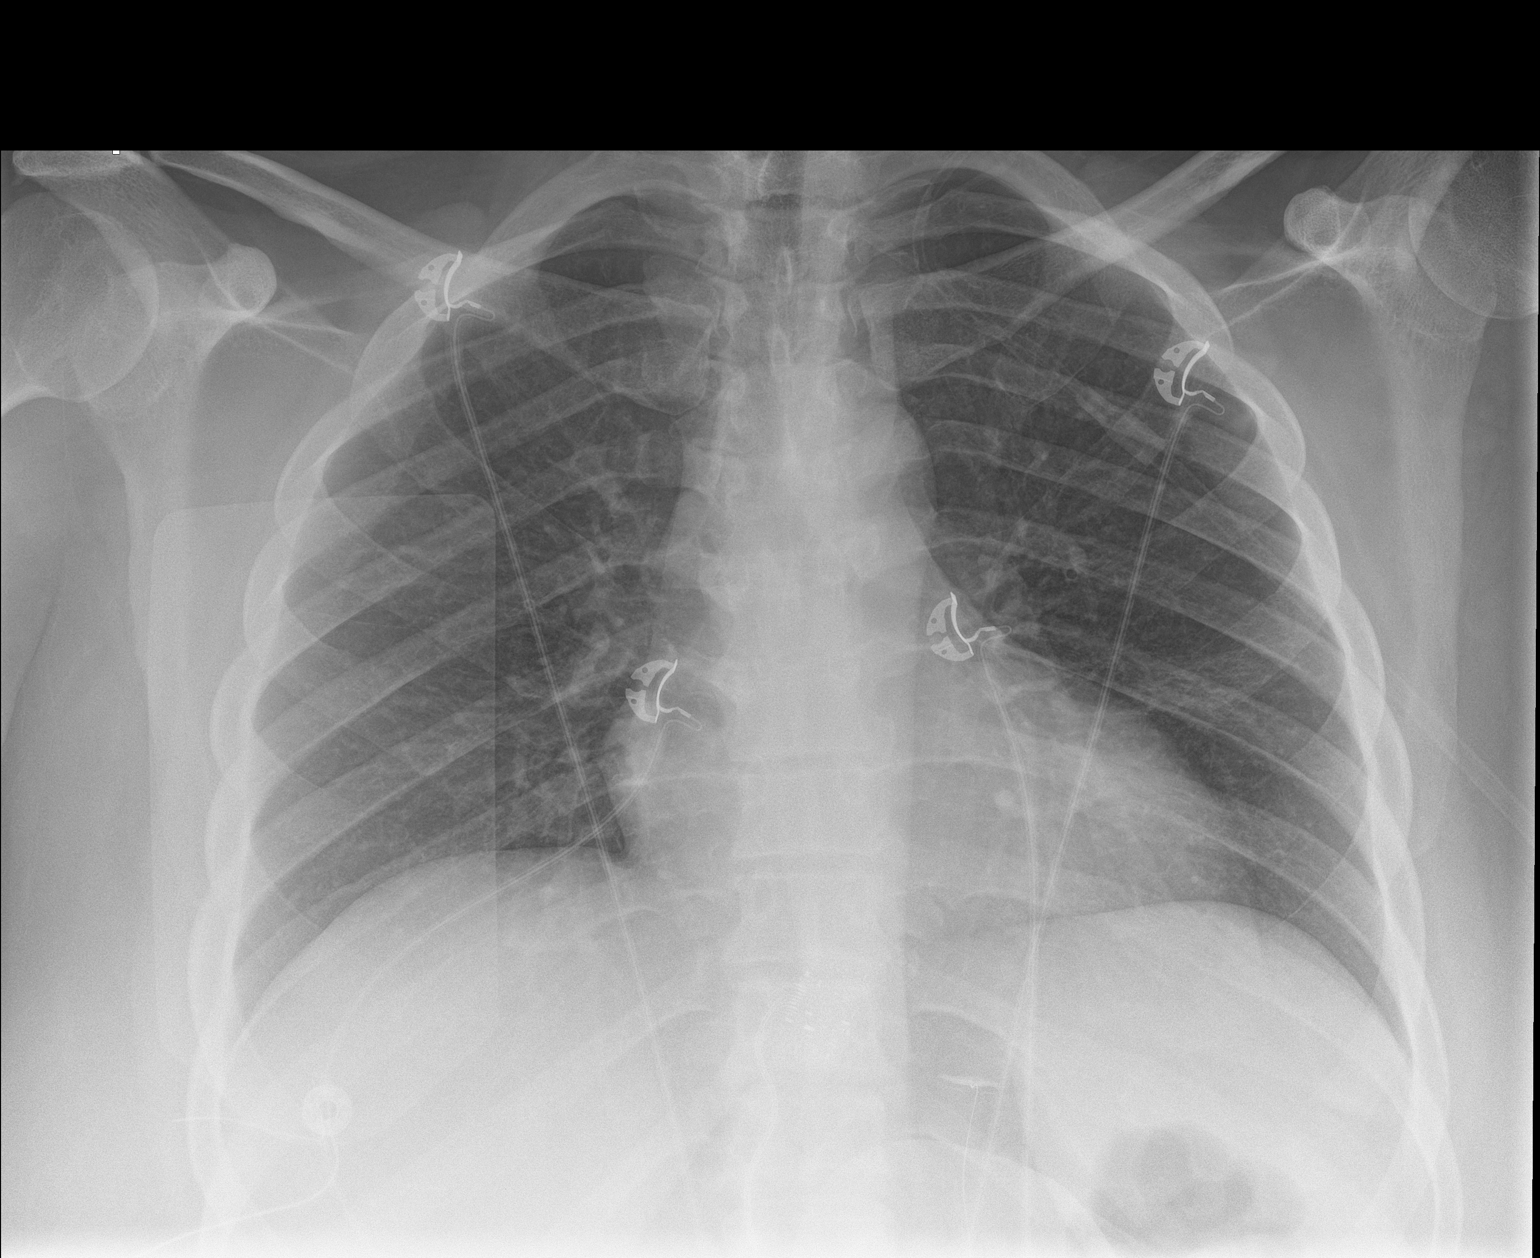

[chest lat]
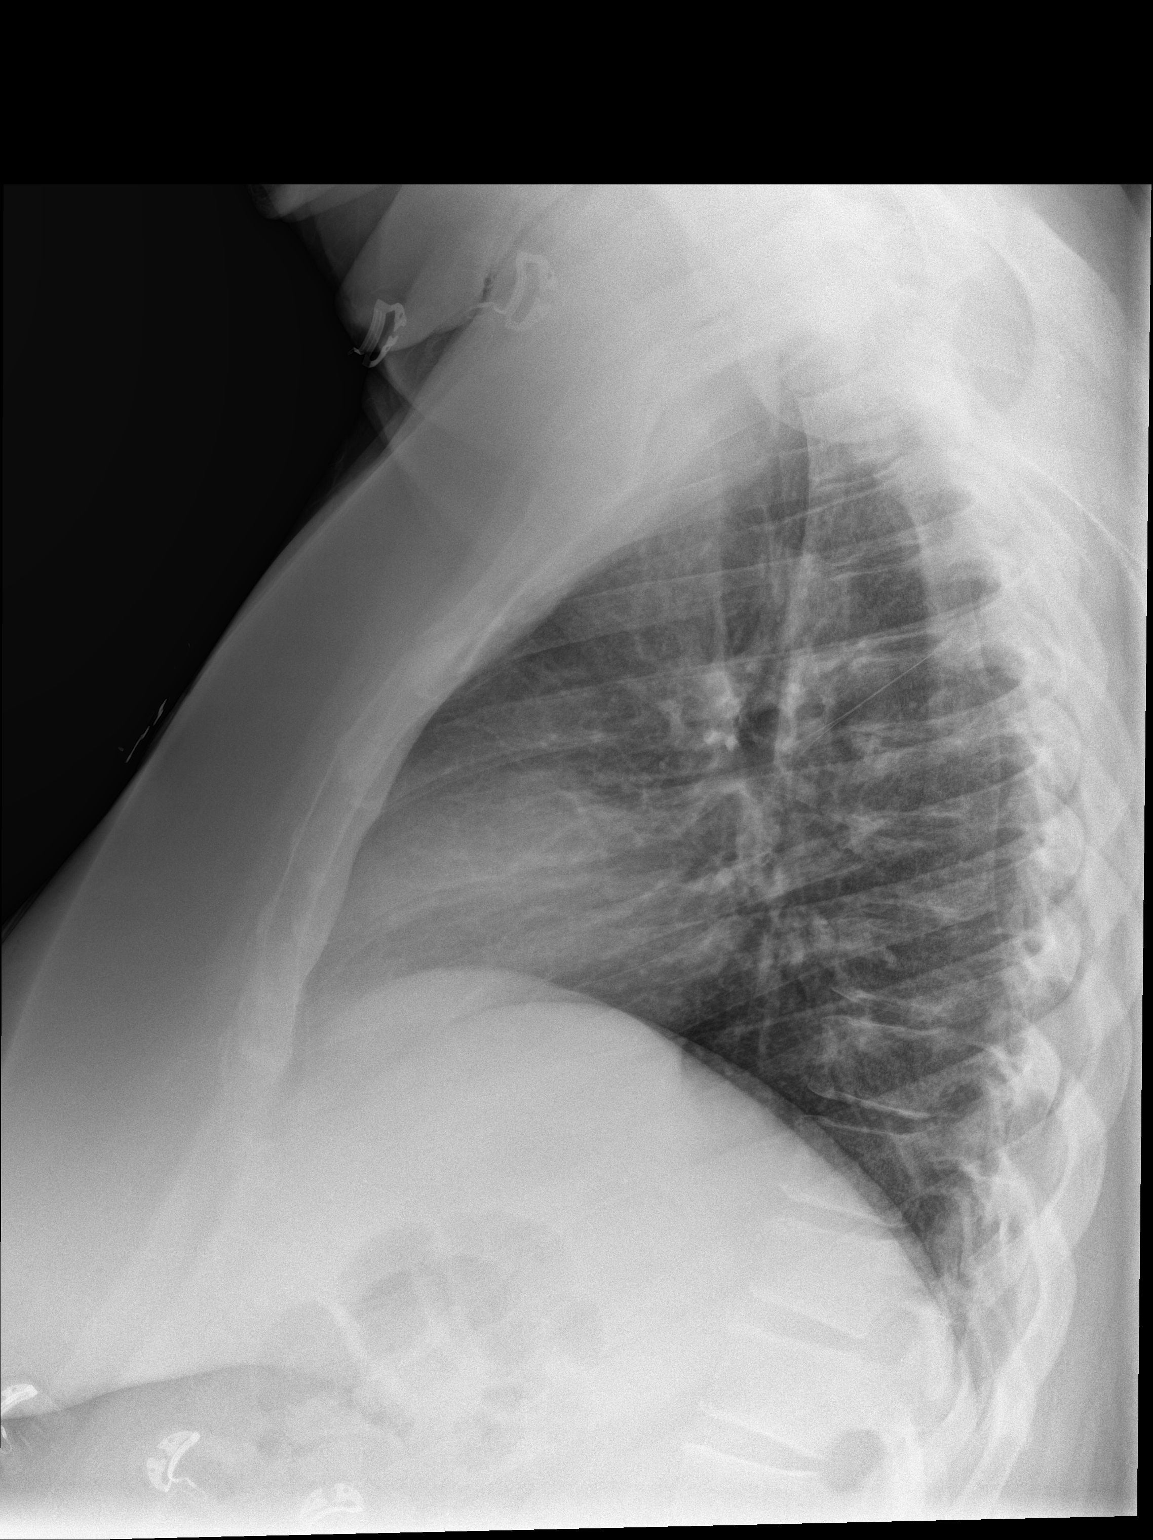

[2 of 2 positions shown; findings below may reference images not displayed]

FINDINGS: Improving aeration in the left midlung zone with minimal residual
streaky opacity. No new consolidation. The cardiomediastinal
contours are normal. Pulmonary vasculature is normal. No pleural
effusion or pneumothorax. No acute osseous abnormalities are seen.
IMPRESSION: Improving aeration of the left midlung zone with minimal residual
streaky opacity, likely resolving pneumonia. No new abnormality is
seen.

## 2015-03-26 MED ORDER — INSULIN ASPART 100 UNIT/ML ~~LOC~~ SOLN
0.0000 [IU] | Freq: Three times a day (TID) | SUBCUTANEOUS | Status: DC
  Administered 2015-03-26 (×2): 3 [IU] via SUBCUTANEOUS

## 2015-03-26 MED ORDER — INSULIN GLARGINE 100 UNIT/ML ~~LOC~~ SOLN: 10.0000 [IU] | Freq: Every day | SUBCUTANEOUS | Status: DC

## 2015-03-26 MED ORDER — POTASSIUM CHLORIDE CRYS ER 20 MEQ PO TBCR
40.0000 meq | EXTENDED_RELEASE_TABLET | Freq: Once | ORAL | Status: AC
  Administered 2015-03-26: 40 meq via ORAL
  Filled 2015-03-26: qty 2

## 2015-03-26 MED ORDER — POTASSIUM CHLORIDE CRYS ER 10 MEQ PO TBCR: 40.0000 meq | EXTENDED_RELEASE_TABLET | Freq: Every day | ORAL | Status: DC

## 2015-03-26 MED ORDER — METOPROLOL TARTRATE 25 MG PO TABS: 25.0000 mg | ORAL_TABLET | Freq: Every day | ORAL | Status: DC | PRN

## 2015-03-26 MED ORDER — SODIUM CHLORIDE 0.9 % IJ SOLN
3.0000 mL | Freq: Two times a day (BID) | INTRAMUSCULAR | Status: DC
  Administered 2015-03-26: 3 mL via INTRAVENOUS

## 2015-03-26 MED ORDER — FERROUS SULFATE 325 (65 FE) MG PO TABS
325.0000 mg | ORAL_TABLET | Freq: Every day | ORAL | Status: DC
  Administered 2015-03-26: 325 mg via ORAL
  Filled 2015-03-26 (×2): qty 1

## 2015-03-26 MED ORDER — INSULIN GLARGINE 100 UNIT/ML ~~LOC~~ SOLN
10.0000 [IU] | Freq: Every day | SUBCUTANEOUS | Status: DC
  Filled 2015-03-26: qty 0.1

## 2015-03-26 MED ORDER — LOSARTAN POTASSIUM 50 MG PO TABS
50.0000 mg | ORAL_TABLET | Freq: Every day | ORAL | Status: DC
  Administered 2015-03-26: 50 mg via ORAL
  Filled 2015-03-26: qty 1

## 2015-03-26 MED ORDER — METOPROLOL SUCCINATE ER 25 MG PO TB24: 25.0000 mg | ORAL_TABLET | Freq: Every day | ORAL | Status: DC

## 2015-03-26 MED ORDER — INSULIN GLARGINE 100 UNIT/ML ~~LOC~~ SOLN
50.0000 [IU] | Freq: Every day | SUBCUTANEOUS | Status: DC
  Administered 2015-03-26: 50 [IU] via SUBCUTANEOUS
  Filled 2015-03-26 (×2): qty 0.5

## 2015-03-26 MED ORDER — ADENOSINE 6 MG/2ML IV SOLN
INTRAVENOUS | Status: AC
  Filled 2015-03-26: qty 6

## 2015-03-26 MED ORDER — LORAZEPAM 2 MG/ML IJ SOLN
1.0000 mg | Freq: Once | INTRAMUSCULAR | Status: AC
  Administered 2015-03-26: 1 mg via INTRAVENOUS

## 2015-03-26 MED ORDER — ENOXAPARIN SODIUM 40 MG/0.4ML ~~LOC~~ SOLN
40.0000 mg | SUBCUTANEOUS | Status: DC
  Administered 2015-03-26: 40 mg via SUBCUTANEOUS
  Filled 2015-03-26: qty 0.4

## 2015-03-26 MED ORDER — ADENOSINE 6 MG/2ML IV SOLN
INTRAVENOUS | Status: AC | PRN
  Administered 2015-03-26: 6 mg via INTRAVENOUS
  Administered 2015-03-26 (×2): 12 mg via INTRAVENOUS

## 2015-03-26 MED ORDER — METFORMIN HCL 500 MG PO TABS
1000.0000 mg | ORAL_TABLET | Freq: Two times a day (BID) | ORAL | Status: DC
  Administered 2015-03-26: 1000 mg via ORAL
  Filled 2015-03-26: qty 2

## 2015-03-26 MED ORDER — INSULIN GLARGINE 100 UNIT/ML ~~LOC~~ SOLN: 50.0000 [IU] | Freq: Every day | SUBCUTANEOUS | Status: DC

## 2015-03-26 MED ORDER — LORAZEPAM 2 MG/ML IJ SOLN
INTRAMUSCULAR | Status: AC
  Administered 2015-03-26: 1 mg via INTRAVENOUS
  Filled 2015-03-26: qty 1

## 2015-03-26 MED ORDER — METOPROLOL TARTRATE 50 MG PO TABS
50.0000 mg | ORAL_TABLET | Freq: Two times a day (BID) | ORAL | Status: DC
  Administered 2015-03-26: 50 mg via ORAL
  Filled 2015-03-26: qty 1

## 2015-03-26 MED ORDER — METOPROLOL TARTRATE 25 MG PO TABS: 25.0000 mg | ORAL_TABLET | Freq: Two times a day (BID) | ORAL | Status: DC

## 2015-03-26 NOTE — Care Management Note (Addendum)
Case Management Note  Patient Details  Name: Kathryn Morris MRN: [id number] Date of Birth: July 11, 1976  Expected Discharge Date:                  Expected Discharge Plan:  Home/Self Care  In-House Referral:  Financial Counselor  Discharge planning Services  CM Consult  Post Acute Care Choice:  NA Choice offered to:  NA  DME Arranged:    DME Agency:     HH Arranged:    HH Agency:     Status of Service:  Completed, signed off  Medicare Important Message Given:    Date Medicare IM Given:    Medicare IM give by:    Date Additional Medicare IM Given:    Additional Medicare Important Message give by:     If discussed at Long Length of Stay Meetings, dates discussed:    Additional Comments: Pt admitted OBS for chest pain. Pt discharging home today with self care. Pt is uninsured and has been referred to [facility]. Pt will follow up with cardiologist. Pt gets med assistance for her insulin and will get other meds off $4 list at [facility] except for K and match voucher will be given for that. No other CM needs noted.  Malcolm Metro, RN 03/26/2015, 2:13 PM

## 2015-03-26 NOTE — Discharge Summary (Signed)
Physician Discharge Summary  Kathryn Morris:[id number] DOB: 23-Dec-1975 DOA: 03/26/2015  PCP: Philbert Riser, Morris  Admit date: 03/26/2015 Discharge date: 03/26/2015  Time spent: 35 minutes  Recommendations for Outpatient Follow-up:  1. Cardiology will arrange outpatient EP. 2. Follow up with cardiologist within 1-2 weeks. 3. Follow up with electrophysiologist for possible ablation.  Discharge Diagnoses:  Principal Problem:   SVT (supraventricular tachycardia) Active Problems:   DM2 (diabetes mellitus, type 2)   HTN (hypertension)   Discharge Condition: Improved  Diet recommendation: Heart healthy  Filed Weights   03/26/15 0317 03/26/15 0605  Weight: 129.729 kg (286 lb) 127.914 kg (282 lb)    History of present illness:  39 y.o. female with h/o DM, HTN, and SVT who presents to the ED with SOB. After extensive vagal maneuvers at home and an additional dose of metoprolol, patient was still found to be in SVT in the ED. Patient was admitted for further evaluation and management.  Hospital Course:  1. SVT. Pt received Adenosine in the ER which broke SVT. She has been in sinus rhythm and feeling significantly improved. She is not having any chest pain or SOB. She was seen by cardiology who recommended daily Toprol and outpatient follow up with EP for possible ablation.  2. DM type 2. Was continued on home meds including lantus, low dose SSI AC/HS. 3. HTN. Continued home meds regimen.  Procedures:    Consultations:  Cardiology  Discharge Exam: Filed Vitals:   03/26/15 0605  BP: 145/98  Pulse: 79  Temp: 98 F (36.7 C)  Resp: 20    General:  Appears comfortable, calm. Cardiovascular: Regular rate and rhythm, no murmur, rub or gallop. No lower extremity edema. Respiratory: Clear to auscultation bilaterally, no wheezes, rales or rhonchi. Normal respiratory effort. Abdomen: soft, ntnd Musculoskeletal: grossly normal tone bilateral upper and lower  extremities Psychiatric: grossly normal mood and affect, speech fluent and appropriate   Discharge Instructions   Discharge Instructions    Diet - low sodium heart healthy    Complete by:  As directed      Increase activity slowly    Complete by:  As directed           Current Discharge Medication List    START taking these medications   Details  !! insulin glargine (LANTUS) 100 UNIT/ML injection Inject 0.1 mLs (10 Units total) into the skin at bedtime. Qty: 10 mL, Refills: 11    !! insulin glargine (LANTUS) 100 UNIT/ML injection Inject 0.5 mLs (50 Units total) into the skin daily. Qty: 10 mL, Refills: 11    metoprolol succinate (TOPROL XL) 25 MG 24 hr tablet Take 1 tablet (25 mg total) by mouth daily. Qty: 30 tablet, Refills: 1    potassium chloride SA (K-DUR,KLOR-CON) 10 MEQ tablet Take 4 tablets (40 mEq total) by mouth daily. Qty: 30 tablet, Refills: 1     !! - Potential duplicate medications found. Please discuss with provider.    CONTINUE these medications which have CHANGED   Details  metoprolol tartrate (LOPRESSOR) 25 MG tablet Take 1 tablet (25 mg total) by mouth daily as needed (palpitations). Qty: 60 tablet, Refills: 1      CONTINUE these medications which have NOT CHANGED   Details  ferrous sulfate 325 (65 FE) MG tablet Take 325 mg by mouth daily with breakfast.    losartan (COZAAR) 50 MG tablet Take 50 mg by mouth daily.    metFORMIN (GLUCOPHAGE) 1000 MG tablet Take 1,000 mg  by mouth 2 (two) times daily with a meal.       Allergies  Allergen Reactions  . Lisinopril    Follow-up Information    Follow up with Kathryn Morris On 03/30/2015.   Specialty:  Cardiology   Why:  At 2:30pm at the office listed on [facility] information:   [address] [place] [address] [phone]        The results of significant diagnostics from this hospitalization (including imaging, microbiology, ancillary and laboratory) are  listed below for reference.    Significant Diagnostic Studies: Dg Chest 2 View  03/26/2015   CLINICAL DATA:  Shortness of breath today.  SVT.  EXAM: CHEST  2 VIEW  COMPARISON:  Radiographs 03/21/2015, CT 01/11/2015  FINDINGS: Improving aeration in the left midlung zone with minimal residual streaky opacity. No new consolidation. The cardiomediastinal contours are normal. Pulmonary vasculature is normal. No pleural effusion or pneumothorax. No acute osseous abnormalities are seen.  IMPRESSION: Improving aeration of the left midlung zone with minimal residual streaky opacity, likely resolving pneumonia. No new abnormality is seen.   Electronically Signed   By: Rubye Oaks M.D.   On: 03/26/2015 05:05   Dg Chest 2 View  03/21/2015   CLINICAL DATA:  Palpitations and increasing shortness of breath since 2200 hours tonight.  EXAM: CHEST  2 VIEW  COMPARISON:  01/11/2015  FINDINGS: Shallow inspiration. Normal heart size and pulmonary vascularity. Mediastinal contours appear intact. Focal infiltration in the left mid lung may represent mild focal area of pneumonia. Right lung is clear and expanded. No blunting of costophrenic angles. No pneumothorax.  IMPRESSION: Focal area of infiltration in the left mid lung may indicate focal pneumonia.   Electronically Signed   By: Burman Nieves M.D.   On: 03/21/2015 02:53    Microbiology:  Labs: Basic Metabolic Panel:  Recent Labs Lab 03/21/15 0020 03/26/15 0315  NA 138 136  K 4.0 3.4*  CL 103 99*  CO2 25 26  GLUCOSE 267* 260*  BUN 10 9  CREATININE 0.84 0.70  CALCIUM 9.4 9.4   Liver Function Tests:  CBC:  Recent Labs Lab 03/21/15 0020 03/26/15 0315  WBC 9.0 10.2  NEUTROABS 5.1 5.2  HGB 12.7 13.8  HCT 38.4 42.3  MCV 90.6 90.6  PLT 483* 444*   Cardiac Enzymes:  Recent Labs Lab 03/21/15 0020 03/26/15 0315  TROPONINI <0.03 <0.03   BNP: BNP (last 3 results)  ProBNP (last 3 results)  CBG:  Recent Labs Lab 03/26/15 0739  03/26/15 1128  GLUCAP 215* 207*    By signing my name below, I, Edman Circle attest that this documentation has been prepared under the direction and in the presence of Dr. Erick Blinks, M.D.   Electronically signed: Edman Circle  03/26/2015    Signed:  Erick Blinks, M.D.  [facility] 03/26/2015, 1:36 PM    I have reviewed the above documentation for accuracy and completeness, and I agree with the above.  MEMON,JEHANZEB

## 2015-03-26 NOTE — ED Notes (Signed)
Pt c/o sudden onset of sob

## 2015-03-26 NOTE — ED Notes (Signed)
Pt given 6 mg adenosine for hr 197 with no change in rate or rhythm, Dr Wilkie Aye at bedside, additional orders given,  ,

## 2015-03-26 NOTE — ED Provider Notes (Signed)
CSN: [id number]     Arrival date & time 03/26/15  0307 History   First MD Initiated Contact with Patient 03/26/15 [phone]     Chief Complaint  Patient presents with  . Tachycardia     (Consider location/radiation/quality/duration/timing/severity/associated sxs/prior Treatment) HPI  This is a 39 year old female with history of diabetes, hypertension, SVT who presents with shortness of breath. Patient reports that she had sudden onset of shortness of breath that woke her up at approximately 1 AM. She denies any chest pain. She states that she felt like she went into SVT. She tried vagal maneuvers at home including holding her breath, breathing through a straw, and ice to the face which did not help. She took an additional dose of her metoprolol prior to being evaluated. This is her third visit since 03/17/14. She was seen once at [facility] and 3 days ago here. She is not followed up with cardiologist. She was seen previously in late June and was due to follow up with cardiology but was unable to follow-up secondary to insurance issues.  I have reviewed the patient's chart and she has had a full workup including CTA to rule out PE and thyroid studies. She had her metoprolol increased to 50 mg twice a day.  Level V caveat for acuity of condition  Past Medical History  Diagnosis Date  . Diabetes mellitus without complication   . Hypertension   . Anemia   . SVT (supraventricular tachycardia)    History reviewed. No pertinent past surgical history. Family History  Problem Relation Age of Onset  . Diabetes Mother   . Heart failure Mother   . Diabetes Other    Social History  Substance Use Topics  . Smoking status: Never Smoker   . Smokeless tobacco: Never Used  . Alcohol Use: Yes   OB History    Gravida Para Term Preterm AB TAB SAB Ectopic Multiple Living   0     Review of Systems  Constitutional: Negative for fever.  Respiratory: Positive for shortness of breath.    Cardiovascular: Positive for palpitations. Negative for chest pain and leg swelling.  Gastrointestinal: Negative for nausea, vomiting and abdominal pain.  Neurological: Negative for syncope.  All other systems reviewed and are negative.     Allergies  Lisinopril  Home Medications   Prior to Admission medications   Medication Sig Start Date End Date Taking? Authorizing Provider  ferrous sulfate 325 (65 FE) MG tablet Take 325 mg by mouth daily with breakfast.    Historical Provider, MD  ibuprofen (ADVIL,MOTRIN) 200 MG tablet Take 400 mg by mouth every 6 (six) hours as needed.    Historical Provider, MD  insulin glargine (LANTUS) 100 UNIT/ML injection Inject 50 Units into the skin at bedtime.     Historical Provider, MD  Liraglutide (VICTOZA) 18 MG/3ML SOPN Inject 1.8 mLs into the skin.    Historical Provider, MD  losartan (COZAAR) 50 MG tablet Take 50 mg by mouth daily.    Historical Provider, MD  metFORMIN (GLUCOPHAGE) 1000 MG tablet Take 1,000 mg by mouth 2 (two) times daily with a meal.    Historical Provider, MD  metoprolol (LOPRESSOR) 25 MG tablet Take 1 tablet (25 mg total) by mouth 2 (two) times daily. 03/21/15   Kristen N Ward, DO   BP 147/88 mmHg  Pulse 86  Temp(Src) 98.4 F (36.9 C) (Oral)  Resp 13  Ht  (1.803 m)  Wt 286 lb (129.729 kg)  BMI 39.91 kg/m2  SpO2 100%  LMP 03/08/2015 Physical Exam  Constitutional: She is oriented to person, place, and time. No distress.  Obese  HENT:  Head: Normocephalic and atraumatic.  Eyes: Pupils are equal, round, and reactive to light.  Cardiovascular: Normal heart sounds.   Extreme tachycardia  Pulmonary/Chest: Effort normal and breath sounds normal. No respiratory distress.  Abdominal: Soft. Bowel sounds are normal. There is no tenderness. There is no rebound.  Musculoskeletal: She exhibits no edema.  Neurological: She is alert and oriented to person, place, and time.  Skin: Skin is warm and dry.  Psychiatric: She has a  normal mood and affect.  Nursing note and vitals reviewed.   ED Course  Procedures (including critical care time)  CRITICAL CARE Performed by: Shon Baton   Total critical care time: 45 min  Critical care time was exclusive of separately billable procedures and treating other patients.  Critical care was necessary to treat or prevent imminent or life-threatening deterioration.  Critical care was time spent personally by me on the following activities: development of treatment plan with patient and/or surrogate as well as nursing, discussions with consultants, evaluation of patient's response to treatment, examination of patient, obtaining history from patient or surrogate, ordering and performing treatments and interventions, ordering and review of laboratory studies, ordering and review of radiographic studies, pulse oximetry and re-evaluation of patient's condition.  Labs Review Labs Reviewed  CBC WITH DIFFERENTIAL/PLATELET - Abnormal; Notable for the following:    Platelets 444 (*)    Lymphs Abs 4.2 (*)    All other components within normal limits  BASIC METABOLIC PANEL - Abnormal; Notable for the following:    Potassium 3.4 (*)    Chloride 99 (*)    Glucose, Bld 260 (*)    All other components within normal limits  TROPONIN I    Imaging Review No results found. I have personally reviewed and evaluated these images and lab results as part of my medical decision-making. 1: EKG Interpretation  Date/Time:  Monday March 26 2015 03:19:49 EDT Ventricular Rate:  182 PR Interval:  236 QRS Duration: 93 QT Interval:  227 QTC Calculation: 395 R Axis:   25 Text Interpretation:  SVT versus ectopic atrial tachycardia, prolonged PR, repolarization abnormality Confirmed by Aadhya Bustamante  MD, Fidela Cieslak ([id number]) on 03/26/2015 3:38:29 AM   2:  EKG Interpretation  Date/Time:  Monday March 26 2015 03:30:34 EDT Ventricular Rate:  130 PR Interval:  149 QRS Duration: 79 QT  Interval:  319 QTC Calculation: 469 R Axis:   19 Text Interpretation:  Sinus tachycardia Consider right atrial enlargement Probable anteroseptal infarct, old Minimal ST depression, lateral leads Missing lead(s): V3 Confirmed by Schneur Crowson  MD, Kalianne Fetting ([id number]) on 03/26/2015 3:38:29 AM   MDM   Final diagnoses:  SVT (supraventricular tachycardia)    Patient presents with her third episode of SVT since September 4. Otherwise nontoxic. Phalen maneuvers failed. Patient required 3 doses of adenosine (6mg -12mg -12mg ) before her heart rate broke. She is now in sinus rhythm and after 1 dose of Ativan her blood pressure has improved to normal range. She has had difficulty obtaining cardiology follow-up secondary to insurance issues and inability to pay up front. She has had normal thyroid studies and a recent CTA the summer to rule out PE. Given the frequency of her SVT, feel observation admission with cardiology evaluation would be reasonable given difficulty to follow-up as an outpatient.  Patient is agreeable to plan.  Discussed with Dr. Julian Reil who has agreed to admit the patient for observation and cardiology evaluation.    Shon Baton, MD 03/26/15 [phone]

## 2015-03-26 NOTE — H&P (Addendum)
[facility] History and Physical  Kathryn Morris ZOX:[id number] DOB: 08/03/1975 DOA: 03/26/2015  Referring physician: EDP PCP: Philbert Riser, MD   Chief Complaint: Tachycardia   HPI: Kathryn Morris is a 39 y.o. female with h/o DM, HTN, and SVT who presents to the ED with c/o SOB.  Patient was awoken from sleep at 1 AM this morning with SOB and tachycardia.  Patient tried very extensive vagal maneuvers at home which didn't help.  She even took an additional dose of metoprolol.  None of this helped.  She presented to the ED where she was found to be in SVT.  This will be her 3rd visit to an ED for SVT in the past week.  Prior work up for SVT has included CTA to r/o PE, thyroid studies.  Review of Systems: Systems reviewed.  As above, otherwise negative  Past Medical History  Diagnosis Date  . Diabetes mellitus without complication   . Hypertension   . Anemia   . SVT (supraventricular tachycardia)    History reviewed. No pertinent past surgical history. Social History:  reports that she has never smoked. She has never used smokeless tobacco. She reports that she drinks alcohol. She reports that she does not use illicit drugs.  Allergies  Allergen Reactions  . Lisinopril     Family History  Problem Relation Age of Onset  . Diabetes Mother   . Heart failure Mother   . Diabetes Other      Prior to Admission medications   Medication Sig Start Date End Date Taking? Authorizing Provider  ferrous sulfate 325 (65 FE) MG tablet Take 325 mg by mouth daily with breakfast.    Historical Provider, MD  losartan (COZAAR) 50 MG tablet Take 50 mg by mouth daily.    Historical Provider, MD  metFORMIN (GLUCOPHAGE) 1000 MG tablet Take 1,000 mg by mouth 2 (two) times daily with a meal.    Historical Provider, MD  metoprolol (LOPRESSOR) 25 MG tablet Take 1 tablet (25 mg total) by mouth 2 (two) times daily. 03/21/15   Layla Maw Ward, DO   Physical Exam: Filed Vitals:   03/26/15 0605   BP: 145/98  Pulse: 79  Temp: 98 F (36.7 C)  Resp:     BP 145/98 mmHg  Pulse 79  Temp(Src) 98 F (36.7 C) (Oral)  Resp 13  Ht  (1.803 m)  Wt 129.729 kg (286 lb)  BMI 39.91 kg/m2  SpO2 98%  LMP 03/08/2015  General Appearance:    Alert, oriented, no distress, appears stated age  Head:    Normocephalic, atraumatic  Eyes:    PERRL, EOMI, sclera non-icteric        Nose:   Nares without drainage or epistaxis. Mucosa, turbinates normal  Throat:   Moist mucous membranes. Oropharynx without erythema or exudate.  Neck:   Supple. No carotid bruits.  No thyromegaly.  No lymphadenopathy.   Back:     No CVA tenderness, no spinal tenderness  Lungs:     Clear to auscultation bilaterally, without wheezes, rhonchi or rales  Chest wall:    No tenderness to palpitation  Heart:    Regular rate and rhythm without murmurs, gallops, rubs  Abdomen:     Soft, non-tender, nondistended, normal bowel sounds, no organomegaly  Genitalia:    deferred  Rectal:    deferred  Extremities:   No clubbing, cyanosis or edema.  Pulses:   2+ and symmetric all extremities  Skin:   Skin  color, texture, turgor normal, no rashes or lesions  Lymph nodes:   Cervical, supraclavicular, and axillary nodes normal  Neurologic:   CNII-XII intact. Normal strength, sensation and reflexes      throughout    Labs on Admission:  Basic Metabolic Panel:  Recent Labs Lab 03/21/15 0020 03/26/15 0315  NA 138 136  K 4.0 3.4*  CL 103 99*  CO2 25 26  GLUCOSE 267* 260*  BUN 10 9  CREATININE 0.84 0.70  CALCIUM 9.4 9.4   Liver Function Tests: No results for input(s): AST, ALT, ALKPHOS, BILITOT, PROT, ALBUMIN in the last 168 hours. No results for input(s): LIPASE, AMYLASE in the last 168 hours. No results for input(s): AMMONIA in the last 168 hours. CBC:  Recent Labs Lab 03/21/15 0020 03/26/15 0315  WBC 9.0 10.2  NEUTROABS 5.1 5.2  HGB 12.7 13.8  HCT 38.4 42.3  MCV 90.6 90.6  PLT 483* 444*   Cardiac  Enzymes:  Recent Labs Lab 03/21/15 0020 03/26/15 0315  TROPONINI <0.03 <0.03    BNP (last 3 results) No results for input(s): PROBNP in the last 8760 hours. CBG: No results for input(s): GLUCAP in the last 168 hours.  Radiological Exams on Admission: Dg Chest 2 View  03/26/2015   CLINICAL DATA:  Shortness of breath today.  SVT.  EXAM: CHEST  2 VIEW  COMPARISON:  Radiographs 03/21/2015, CT 01/11/2015  FINDINGS: Improving aeration in the left midlung zone with minimal residual streaky opacity. No new consolidation. The cardiomediastinal contours are normal. Pulmonary vasculature is normal. No pleural effusion or pneumothorax. No acute osseous abnormalities are seen.  IMPRESSION: Improving aeration of the left midlung zone with minimal residual streaky opacity, likely resolving pneumonia. No new abnormality is seen.   Electronically Signed   By: Rubye Oaks M.D.   On: 03/26/2015 05:05    EKG: Independently reviewed.  Assessment/Plan Principal Problem:   SVT (supraventricular tachycardia) Active Problems:   DM2 (diabetes mellitus, type 2)   HTN (hypertension)   1. SVT - prior workup has included CTA to r/o PE, thyroid studies 1. SVT has been recurrent, although she was converted with adenosine, we will admit and get cards consult at this point as her next cardiology follow up (with [facility]) is not for a month. 2. DM2 - Continue home meds including lantus, low dose SSI AC/HS as well 3. HTN - continue home meds    Code Status: Full  Family Communication: Mother at bedside Disposition Plan: Admit to obs   Time spent: 50 min  Holleigh Crihfield M. [facility] Pager [phone]  If 7AM-7PM, please contact the day team taking care of the patient [email or web address] Password [facility] 03/26/2015, 6:22 AM

## 2015-03-26 NOTE — ED Notes (Signed)
Pt back from xray; pt states she feels "loopy", informed pt it is probably from Ativan that was administered

## 2015-03-26 NOTE — ED Notes (Signed)
Pt up and assisted to bathroom and back to bed; report given to [employer] on 300

## 2015-03-26 NOTE — Progress Notes (Signed)
Central Telemetry notified patient being discharged, telemetry removed.  IV removed.  Discharge instructions reviewed, questions answered.  Family in room.  Case manager to room with voucher for medications.

## 2015-03-26 NOTE — Consult Note (Signed)
CARDIOLOGY CONSULT NOTE   Patient ID: Kathryn Morris MRN: [id number] DOB/AGE: 1975/09/05 39 y.o.  Admit Date: 03/26/2015 Referring Physician: PTH Primary Physician: Philbert Riser, MD Consulting Cardiologist: Charlton Haws MD Primary Cardiologist: Dina Rich MD Reason for Consultation: SVT  Clinical Summary Kathryn Morris is a 39 y.o.female with known history of PSVT, diabetes, hypertension and anemia. She had been seen in the ER on 01/11/2015 with SVT and treated with adenosine. She was to see Dr. Wyline Mood on 01/01/2015 but did not show for appointment.  However She indicates that she did not have the money. Usually seen by [facility]  Had SVT 5 years ago.  Started recurring this summer and has had 3 episodes requiring ER visit and adenosine.  She took extra metoprolol this am.  Had rapid onset of palpitations In ER SVT  Broke with adenosine Feels fine now.  Has HTN and DM on meds and compliant.  No syncope or family history of sudden death.  No chest pain with palpitations mild dyspnea.  Denies stimulants drugs or ETOH.     Allergies  Allergen Reactions  . Lisinopril     Medications Scheduled Medications: . enoxaparin (LOVENOX) injection  40 mg Subcutaneous Q24H  . ferrous sulfate  325 mg Oral Q breakfast  . insulin aspart  0-9 Units Subcutaneous TID WC  . insulin glargine  10 Units Subcutaneous QHS  . insulin glargine  50 Units Subcutaneous Daily  . losartan  50 mg Oral Daily  . metFORMIN  1,000 mg Oral BID WC  . metoprolol  50 mg Oral BID  . sodium chloride  3 mL Intravenous Q12H    Infusions:    PRN Medications:     Past Medical History  Diagnosis Date  . Diabetes mellitus without complication   . Hypertension   . Anemia   . SVT (supraventricular tachycardia)     History reviewed. No pertinent past surgical history.  Family History  Problem Relation Age of Onset  . Diabetes Mother   . Heart failure Mother   . Diabetes Other      Social History Kathryn Morris reports that she has never smoked. She has never used smokeless tobacco. Kathryn Morris reports that she drinks alcohol.  Review of Systems Complete review of systems are found to be negative unless outlined in H&P above.  Physical Examination Blood pressure 145/98, pulse 79, temperature 98 F (36.7 C), temperature source Oral, resp. rate 20, height 5\' 11"  (1.803 m), weight 282 lb (127.914 kg), last menstrual period 03/08/2015, SpO2 98 %. No intake or output data in the 24 hours ending 03/26/15 0803  Telemetry:  NSR  SVT gone   GEN:  Obese female  HEENT: Conjunctiva and lids normal, oropharynx clear with moist mucosa. Neck: Supple, no elevated JVP or carotid bruits, no thyromegaly. Lungs: Clear to auscultation, nonlabored breathing at rest. Cardiac: Regular rate and rhythm, no S3 or significant systolic murmur, no pericardial rub. Abdomen: Soft, nontender, no hepatomegaly, bowel sounds present, no guarding or rebound. Extremities: No pitting edema, distal pulses 2+. Skin: Warm and dry. Musculoskeletal: No kyphosis. Neuropsychiatric: Alert and oriented x3, affect grossly appropriate.  Prior Cardiac Testing/Procedures  Lab Results  Basic Metabolic Panel:  Recent Labs Lab 03/21/15 0020 03/26/15 0315  NA 138 136  K 4.0 3.4*  CL 103 99*  CO2 25 26  GLUCOSE 267* 260*  BUN 10 9  CREATININE 0.84 0.70  CALCIUM 9.4 9.4     CBC:  Recent  Labs Lab 03/21/15 0020 03/26/15 0315  WBC 9.0 10.2  NEUTROABS 5.1 5.2  HGB 12.7 13.8  HCT 38.4 42.3  MCV 90.6 90.6  PLT 483* 444*    Cardiac Enzymes:  Recent Labs Lab 03/21/15 0020 03/26/15 0315  TROPONINI <0.03 <0.03    BNP: Invalid input(s): POCBNP   Radiology: Dg Chest 2 View  03/26/2015   CLINICAL DATA:  Shortness of breath today.  SVT.  EXAM: CHEST  2 VIEW  COMPARISON:  Radiographs 03/21/2015, CT 01/11/2015  FINDINGS: Improving aeration in the left midlung zone with minimal residual  streaky opacity. No new consolidation. The cardiomediastinal contours are normal. Pulmonary vasculature is normal. No pleural effusion or pneumothorax. No acute osseous abnormalities are seen.  IMPRESSION: Improving aeration of the left midlung zone with minimal residual streaky opacity, likely resolving pneumonia. No new abnormality is seen.   Electronically Signed   By: Rubye Oaks M.D.   On: 03/26/2015 05:05     ECG:  9/123  SVT rate 182  Converted to ST rate 130     Impression and Recommendations  SVT:  Keep her on Toprol 25 mg daily.  Will arrange outpatient EP f/u for ablation.  Check TSH/T4  Ok to d/c patient home today DM:  Discussed low carb diet.  Target hemoglobin A1c is 6.5 or less.  Continue current medications. HTN:  Well controlled.  Continue current medications and low sodium Dash type diet.    Consider adding Kdur 10 meq daily Obesity:  Discussed low carb diet     Charlton Haws

## 2015-03-26 NOTE — ED Notes (Signed)
Pt given 2nd dose of adenosine with drop in hr to mid 80's with immediate increase in hr again to 202, Dr Wilkie Aye remains at bedside, additional orders given ,

## 2015-03-26 NOTE — ED Notes (Signed)
After 3rd dose of adenosine, hr decreased to 128, sinus tach on monitor, repeat ekg performed, pt reports that she is feeling better, still continues to deny any pain,

## 2015-03-30 ENCOUNTER — Ambulatory Visit (INDEPENDENT_AMBULATORY_CARE_PROVIDER_SITE_OTHER): Payer: Self-pay | Admitting: Internal Medicine

## 2015-03-30 ENCOUNTER — Encounter: Payer: Self-pay | Admitting: Internal Medicine

## 2015-03-30 VITALS — BP 134/88 | HR 63 | Ht 71.0 in | Wt 282.4 lb

## 2015-03-30 DIAGNOSIS — I471 Supraventricular tachycardia: Secondary | ICD-10-CM

## 2015-03-30 DIAGNOSIS — I10 Essential (primary) hypertension: Secondary | ICD-10-CM

## 2015-03-30 NOTE — Patient Instructions (Signed)
Medication Instructions:  Your physician recommends that you continue on your current medications as directed. Please refer to the Current Medication list given to you today.   Labwork: None ordered  Testing/Procedures: Your physician has recommended that you have an ablation. Catheter ablation is a medical procedure used to treat some cardiac arrhythmias (irregular heartbeats). During catheter ablation, a long, thin, flexible tube is put into a blood vessel in your groin (upper thigh), or neck. This tube is called an ablation catheter. It is then guided to your heart through the blood vessel. Radio frequency waves destroy small areas of heart tissue where abnormal heartbeats may cause an arrhythmia to start. Please see the instruction sheet given to you today.  Need to inquire about your coverage before we schedule  Follow-Up: Your physician recommends that you schedule a follow-up appointment to be determined   Any Other Special Instructions Will Be Listed Below (If Applicable).

## 2015-03-31 DIAGNOSIS — E65 Localized adiposity: Secondary | ICD-10-CM | POA: Insufficient documentation

## 2015-03-31 MED ORDER — METOPROLOL SUCCINATE ER 25 MG PO TB24: 25.0000 mg | ORAL_TABLET | Freq: Every day | ORAL | Status: DC

## 2015-03-31 NOTE — Progress Notes (Signed)
Electrophysiology Office Note   Date:  03/31/2015   ID:  Kathryn Morris, DOB 12/31/1975, MRN [id number]  PCP:  Philbert Riser, MD  Cardiologist:  Dr Wyline Mood Primary Electrophysiologist: [name], MD    Chief Complaint  Patient presents with  . SVT     History of Present Illness: Kathryn Morris is a 39 y.o. female who presents today for electrophysiology evaluation.   The patient has recurrent symptomatic SVT.  She reports having abrupt onset/ offset of tachypalpitations.  She is unaware of triggers or precipitants.  She reports symptoms of "heart racing" and anxiety.  She has tried vagal maneuvers without improved.  Her first episode was several years ago.  She has had tachycardia requiring adenosine administration in July, August, and September.  Today, she denies symptoms of palpitations, chest pain, shortness of breath, orthopnea, PND, lower extremity edema, claudication, dizziness, presyncope, syncope, bleeding, or neurologic sequela. The patient is tolerating medications without difficulties and is otherwise without complaint today.    Past Medical History  Diagnosis Date  . Diabetes mellitus without complication   . Hypertension   . Anemia   . SVT (supraventricular tachycardia)    No past surgical history on file.   Current Outpatient Prescriptions  Medication Sig Dispense Refill  . ferrous sulfate 325 (65 FE) MG tablet Take 325 mg by mouth daily with breakfast.    . insulin glargine (LANTUS) 100 UNIT/ML injection Inject 0.1 mLs (10 Units total) into the skin at bedtime. 10 mL 11  . insulin glargine (LANTUS) 100 UNIT/ML injection Inject 0.5 mLs (50 Units total) into the skin daily. 10 mL 11  . losartan (COZAAR) 50 MG tablet Take 50 mg by mouth daily.    . metFORMIN (GLUCOPHAGE) 1000 MG tablet Take 1,000 mg by mouth 2 (two) times daily with a meal.    . metoprolol succinate (TOPROL XL) 25 MG 24 hr tablet Take 1 tablet (25 mg total) by mouth daily. 30 tablet 1  .  metoprolol tartrate (LOPRESSOR) 25 MG tablet Take 1 tablet (25 mg total) by mouth daily as needed (palpitations). 60 tablet 1  . potassium chloride SA (K-DUR,KLOR-CON) 10 MEQ tablet Take 4 tablets (40 mEq total) by mouth daily. 30 tablet 1   No current facility-administered medications for this visit.    Allergies:   Lisinopril   Social History:  The patient  reports that she has never smoked. She has never used smokeless tobacco. She reports that she drinks alcohol. She reports that she does not use illicit drugs.   Family History:  The patient's  family history includes Diabetes in her mother and other; Heart failure in her mother.    ROS:  Please see the history of present illness.   All other systems are reviewed and negative.    PHYSICAL EXAM: VS:  BP 134/88 mmHg  Pulse 63  Ht  (1.803 m)  Wt 282 lb 6.4 oz (128.096 kg)  BMI 39.40 kg/m2  LMP 03/08/2015 , BMI Body mass index is 39.4 kg/(m^2). GEN: overweight, in no acute distress HEENT: normal Neck: no JVD, carotid bruits, or masses Cardiac: RRR; no murmurs, rubs, or gallops,no edema  Respiratory:  clear to auscultation bilaterally, normal work of breathing GI: soft, nontender, nondistended, + BS MS: no deformity or atrophy Skin: warm and dry  Neuro:  Strength and sensation are intact Psych: euthymic mood, full affect  EKG:  EKG 03/26/15 is reviewed and reveals short RP SVT   Recent Labs:  03/21/2015: TSH 2.606 03/26/2015: BUN 9; Creatinine, Ser 0.70; Hemoglobin 13.8; Platelets 444*; Potassium 3.4*; Sodium 136    Lipid Panel  No results found for: CHOL, TRIG, HDL, CHOLHDL, VLDL, LDLCALC, LDLDIRECT   Wt Readings from Last 3 Encounters:  03/30/15 282 lb 6.4 oz (128.096 kg)  03/26/15 282 lb (127.914 kg)  03/21/15 297 lb (134.718 kg)      Other studies Reviewed: Additional studies/ records that were reviewed today include: hospital records, Dr Princess Perna records   ASSESSMENT AND PLAN:  1.  SVT The patient has  documented recurrent adenosine sensitive short RP SVT. Therapeutic strategies for supraventricular tachycardia including medicine and ablation were discussed in detail with the patient today. Risk, benefits, and alternatives to EP study and radiofrequency ablation were also discussed in detail today. These risks include but are not limited to stroke, bleeding, vascular damage, tamponade, perforation, damage to the heart and other structures, AV block requiring pacemaker, worsening renal function, and death. The patient understands these risk and wishes to proceed.  We will therefore proceed with catheter ablation at the next available time.  2. Obesity Weight reduction advised  3. HTN Stable No change required today  Current medicines are reviewed at length with the patient today.   The patient does not have concerns regarding her medicines.  The following changes were made today:  none  Signed, [name], MD  03/31/2015 10:09 PM     [facility] [address] [place] [address] [phone] (office) [phone] (fax)

## 2015-04-02 ENCOUNTER — Telehealth: Payer: Self-pay | Admitting: *Deleted

## 2015-04-02 ENCOUNTER — Encounter: Payer: Self-pay | Admitting: Adult Health

## 2015-04-02 MED ORDER — METOPROLOL TARTRATE 25 MG PO TABS: 25.0000 mg | ORAL_TABLET | Freq: Every day | ORAL | Status: DC | PRN

## 2015-04-02 NOTE — Care Management (Signed)
CM dept contacted by pt stating her Rx's were not sent to a pharm nor were they given to her upon DC on 03/26/2015. Pt has since been to her cardiologist. Cardiologist NP contacted and asked to write Rx for pt. Rx given and faxed to [facility] per pt's choice. When pt contacted to update on potassium Rx, she also had questions about her metoprolol. Chart reviewed and CM not confident from documentation to clarify pt's metoprolol needs. Request sent to cardiologist NP to review pt's chart and call her to clarify medication needs.

## 2015-04-02 NOTE — Telephone Encounter (Signed)
Received direct call from operator from [facility] in [place].  I spoke with pharmacy tech who is asking about metoprolol dosing.  I told pharmacy our medication list indicates pt is to take Toprol XL 25 mg by mouth daily and metoprolol tartrate 25 mg by mouth daily as needed for palpitations.  They have prescription for Toprol XL but the prescription they have on file is for metoprolol tartrate 25 daily.  I told them to discontinue the daily dose for metoprolol tartrate and a new prescription would be sent in for metoprolol tartrate 25 daily as needed for palpitations.

## 2015-04-02 NOTE — Progress Notes (Signed)
Requested by Care Manager to write RX for potassium 10 mEq 4 tablets daily as she was not given Rx on discharge. This was provided and given to Care Manager, Kathyrn Sheriff RN-who will give to patient. She is to call our office for questions or more refills.

## 2015-04-03 ENCOUNTER — Encounter: Payer: Self-pay | Admitting: Cardiology

## 2015-05-08 ENCOUNTER — Encounter (HOSPITAL_COMMUNITY): Payer: Self-pay

## 2015-05-08 ENCOUNTER — Emergency Department (HOSPITAL_COMMUNITY)
Admission: EM | Admit: 2015-05-08 | Discharge: 2015-05-08 | Disposition: A | Discharge status: Home / Self Care | Payer: Self-pay | Attending: Emergency Medicine | Admitting: Emergency Medicine

## 2015-05-08 DIAGNOSIS — E119 Type 2 diabetes mellitus without complications: Secondary | ICD-10-CM | POA: Insufficient documentation

## 2015-05-08 DIAGNOSIS — I10 Essential (primary) hypertension: Secondary | ICD-10-CM | POA: Insufficient documentation

## 2015-05-08 DIAGNOSIS — I471 Supraventricular tachycardia: Secondary | ICD-10-CM | POA: Insufficient documentation

## 2015-05-08 DIAGNOSIS — D649 Anemia, unspecified: Secondary | ICD-10-CM | POA: Insufficient documentation

## 2015-05-08 DIAGNOSIS — Z79899 Other long term (current) drug therapy: Secondary | ICD-10-CM | POA: Insufficient documentation

## 2015-05-08 DIAGNOSIS — Z794 Long term (current) use of insulin: Secondary | ICD-10-CM | POA: Insufficient documentation

## 2015-05-08 MED ORDER — SODIUM CHLORIDE 0.9 % IV BOLUS (SEPSIS)
1000.0000 mL | Freq: Once | INTRAVENOUS | Status: AC
  Administered 2015-05-08: 1000 mL via INTRAVENOUS

## 2015-05-08 MED ORDER — METOPROLOL SUCCINATE ER 25 MG PO TB24: 12.5000 mg | ORAL_TABLET | Freq: Every day | ORAL | Status: DC

## 2015-05-08 MED ORDER — ADENOSINE 6 MG/2ML IV SOLN
12.0000 mg | Freq: Once | INTRAVENOUS | Status: AC
  Administered 2015-05-08: 12 mg via INTRAVENOUS
  Filled 2015-05-08: qty 4

## 2015-05-08 MED ORDER — LORAZEPAM 2 MG/ML IJ SOLN
1.0000 mg | Freq: Once | INTRAMUSCULAR | Status: AC
  Administered 2015-05-08: 1 mg via INTRAVENOUS
  Filled 2015-05-08: qty 1

## 2015-05-08 NOTE — ED Notes (Signed)
Pt reports history of SVT, reports at 1330 today was at work and started feeling like heart was racing and was having sob.

## 2015-05-08 NOTE — ED Provider Notes (Signed)
CSN: [id number]     Arrival date & time 05/08/15  1619 History   First MD Initiated Contact with Patient 05/08/15 1629     Chief Complaint  Patient presents with  . Tachycardia     Patient is a 39 y.o. female presenting with palpitations. The history is provided by the patient.  Palpitations Palpitations quality:  Fast Onset quality:  Sudden Duration:  3 minutes Timing:  Constant Progression:  Unchanged Chronicity:  Recurrent Relieved by:  Nothing Worsened by:  Nothing Associated symptoms: shortness of breath   Associated symptoms: no chest pain, no syncope, no vomiting and no weakness   pt with h/o SVT She takes metoprolol daily This started about 3 hrs ago and did not resolve with vagal maneuvers No CP reported She has had multiple episodes previously. She has not had an ablation as of yet but has seen cardiology previously  Past Medical History  Diagnosis Date  . Diabetes mellitus without complication (HCC)   . Hypertension   . Anemia   . SVT (supraventricular tachycardia) (HCC)    History reviewed. No pertinent past surgical history. Family History  Problem Relation Age of Onset  . Diabetes Mother   . Heart failure Mother   . Diabetes Other    Social History  Substance Use Topics  . Smoking status: Never Smoker   . Smokeless tobacco: Never Used  . Alcohol Use: No   OB History    Gravida Para Term Preterm AB TAB SAB Ectopic Multiple Living   1    1  1    0     Review of Systems  Constitutional: Negative for fever.  Respiratory: Positive for shortness of breath.   Cardiovascular: Positive for palpitations. Negative for chest pain and syncope.  Gastrointestinal: Negative for vomiting and abdominal pain.  Neurological: Negative for weakness.  All other systems reviewed and are negative.     Allergies  Lisinopril  Home Medications   Prior to Admission medications   Medication Sig Start Date End Date Taking? Authorizing Provider  ferrous sulfate 325  (65 FE) MG tablet Take 325 mg by mouth daily with breakfast.    Historical Provider, MD  insulin glargine (LANTUS) 100 UNIT/ML injection Inject 0.1 mLs (10 Units total) into the skin at bedtime. 03/26/15   Erick BlinksJehanzeb Memon, MD  insulin glargine (LANTUS) 100 UNIT/ML injection Inject 0.5 mLs (50 Units total) into the skin daily. 03/26/15   Erick BlinksJehanzeb Memon, MD  losartan (COZAAR) 50 MG tablet Take 50 mg by mouth daily.    Historical Provider, MD  metFORMIN (GLUCOPHAGE) 1000 MG tablet Take 1,000 mg by mouth 2 (two) times daily with a meal.    Historical Provider, MD  metoprolol succinate (TOPROL XL) 25 MG 24 hr tablet Take 1 tablet (25 mg total) by mouth daily. 03/31/15   Hillis RangeJames Allred, MD  metoprolol tartrate (LOPRESSOR) 25 MG tablet Take 1 tablet (25 mg total) by mouth daily as needed (palpitations). 04/02/15   Hillis RangeJames Allred, MD  potassium chloride SA (K-DUR,KLOR-CON) 10 MEQ tablet Take 4 tablets (40 mEq total) by mouth daily. 03/26/15   Erick BlinksJehanzeb Memon, MD   LMP 05/04/2015 Physical Exam CONSTITUTIONAL: Well developed/well nourished HEAD: Normocephalic/atraumatic EYES: EOMI/PERRL ENMT: Mucous membranes moist NECK: supple no meningeal signs SPINE/BACK:entire spine nontender CV: tachycardic, no murmurs/rubs/gallops noted LUNGS: Lungs are clear to auscultation bilaterally, no apparent distress ABDOMEN: soft, nontender, no rebound or guarding, bowel sounds noted throughout abdomen GU:no cva tenderness NEURO: Pt is awake/alert/appropriate, moves all extremitiesx4.  No  facial droop.   EXTREMITIES: pulses normal/equal, full ROM SKIN: warm, color normal PSYCH: no abnormalities of mood noted, alert and oriented to situation  ED Course  .Cardioversion Date/Time: 05/08/2015 5:06 PM Performed by: Zadie Rhine Authorized by: Zadie Rhine Consent: Verbal consent obtained. Consent given by: patient Cardioversion basis: emergent Pre-procedure rhythm: supraventricular tachycardia Patient position:  patient was placed in a supine position Number of attempts: 1 Attempt 1 outcome: conversion to normal sinus rhythm Post-procedure rhythm: normal sinus rhythm Complications: no complications Patient tolerance: Patient tolerated the procedure well with no immediate complications Comments: Pt with SVT and required one dose of adenosine  and she was chemically cardioverted in the ER    CRITICAL CARE Performed by: Joya Gaskins Total critical care time: 33 Critical care time was exclusive of separately billable procedures and treating other patients. Critical care was necessary to treat or prevent imminent or life-threatening deterioration. Critical care was time spent personally by me on the following activities: development of treatment plan with patient and/or surrogate as well as nursing, discussions with consultants, evaluation of patient's response to treatment, examination of patient, obtaining history from patient or surrogate, ordering and performing treatments and interventions, ordering and review of laboratory studies, ordering and review of radiographic studies, pulse oximetry and re-evaluation of patient's condition.    EKG Interpretation   Date/Time:  Tuesday May 08 2015 16:23:59 EDT Ventricular Rate:  165 PR Interval:    QRS Duration: 98 QT Interval:  280 QTC Calculation: 463 R Axis:   -9 Text Interpretation:  Supraventricular tachycardia Left ventricular  hypertrophy with repolarization abnormality Abnormal ECG Confirmed by  Bebe Shaggy  MD, Camielle Sizer ([id number]) on 05/08/2015 4:33:00 PM     Medications  adenosine (ADENOCARD) 6 MG/2ML injection 12 mg (12 mg Intravenous Given 05/08/15 1649)  LORazepam (ATIVAN) injection 1 mg (1 mg Intravenous Given 05/08/15 1656)  sodium chloride 0.9 % bolus 1,000 mL (1,000 mLs Intravenous New Bag/Given 05/08/15 1653)   5:08 PM Pt improved after adenosine Will monitor in ED She may need home med adjustment 5:37 PM Pt  improved BP 152/90 mmHg  Pulse 93  Resp 20  SpO2 100%  LMP 05/04/2015  7:04 PM Pt well appearing No further SVT She would like to go home Plan to add on toprol xl 12.5 in addition to  daily.  Total of 37.5mg  daily Referred to cardiology    EKG Interpretation  Date/Time:  Tuesday May 08 2015 17:19:35 EDT Ventricular Rate:  92 PR Interval:  185 QRS Duration: 87 QT Interval:  362 QTC Calculation: 448 R Axis:   11 Text Interpretation:  Sinus rhythm Left ventricular hypertrophy Nonspecific T abnormalities, lateral leads Confirmed by Bebe Shaggy  MD, Dorinda Hill ([id number]) on 05/08/2015 5:29:42 PM        MDM   Final diagnoses:  SVT (supraventricular tachycardia) (HCC)    Nursing notes including past medical history and social history reviewed and considered in documentation Previous records reviewed and considered     Zadie Rhine, MD 05/08/15 1904

## 2015-06-15 ENCOUNTER — Encounter (HOSPITAL_COMMUNITY): Payer: Self-pay | Admitting: Emergency Medicine

## 2015-06-15 ENCOUNTER — Emergency Department (HOSPITAL_COMMUNITY): Payer: Self-pay

## 2015-06-15 ENCOUNTER — Emergency Department (HOSPITAL_COMMUNITY)
Admission: EM | Admit: 2015-06-15 | Discharge: 2015-06-15 | Disposition: A | Discharge status: Home / Self Care | Payer: Self-pay | Attending: Emergency Medicine | Admitting: Emergency Medicine

## 2015-06-15 DIAGNOSIS — E119 Type 2 diabetes mellitus without complications: Secondary | ICD-10-CM | POA: Insufficient documentation

## 2015-06-15 DIAGNOSIS — Z79899 Other long term (current) drug therapy: Secondary | ICD-10-CM | POA: Insufficient documentation

## 2015-06-15 DIAGNOSIS — Z7982 Long term (current) use of aspirin: Secondary | ICD-10-CM | POA: Insufficient documentation

## 2015-06-15 DIAGNOSIS — Z794 Long term (current) use of insulin: Secondary | ICD-10-CM | POA: Insufficient documentation

## 2015-06-15 DIAGNOSIS — Z7984 Long term (current) use of oral hypoglycemic drugs: Secondary | ICD-10-CM | POA: Insufficient documentation

## 2015-06-15 DIAGNOSIS — I471 Supraventricular tachycardia: Secondary | ICD-10-CM | POA: Insufficient documentation

## 2015-06-15 DIAGNOSIS — I10 Essential (primary) hypertension: Secondary | ICD-10-CM | POA: Insufficient documentation

## 2015-06-15 DIAGNOSIS — D649 Anemia, unspecified: Secondary | ICD-10-CM | POA: Insufficient documentation

## 2015-06-15 LAB — CBC WITH DIFFERENTIAL/PLATELET
Basophils Absolute: 0 10*3/uL (ref 0.0–0.1)
Basophils Relative: 0 %
Eosinophils Absolute: 0.1 10*3/uL (ref 0.0–0.7)
Eosinophils Relative: 2 %
HCT: 38 % (ref 36.0–46.0)
Hemoglobin: 12.3 g/dL (ref 12.0–15.0)
Lymphocytes Relative: 38 %
Lymphs Abs: 3.3 10*3/uL (ref 0.7–4.0)
MCH: 30 pg (ref 26.0–34.0)
MCHC: 32.4 g/dL (ref 30.0–36.0)
MCV: 92.7 fL (ref 78.0–100.0)
Monocytes Absolute: 0.3 10*3/uL (ref 0.1–1.0)
Monocytes Relative: 4 %
Neutro Abs: 5 10*3/uL (ref 1.7–7.7)
Neutrophils Relative %: 56 %
Platelets: 471 10*3/uL — ABNORMAL HIGH (ref 150–400)
RBC: 4.1 MIL/uL (ref 3.87–5.11)
RDW: 13.3 % (ref 11.5–15.5)
WBC: 8.8 10*3/uL (ref 4.0–10.5)

## 2015-06-15 LAB — BASIC METABOLIC PANEL
Anion gap: 12 (ref 5–15)
BUN: 12 mg/dL (ref 6–20)
CO2: 27 mmol/L (ref 22–32)
Calcium: 9.5 mg/dL (ref 8.9–10.3)
Chloride: 101 mmol/L (ref 101–111)
Creatinine, Ser: 0.75 mg/dL (ref 0.44–1.00)
GFR calc Af Amer: >60 mL/min (ref >60)
GFR calc non Af Amer: >60 mL/min (ref >60)
Glucose, Bld: 150 mg/dL — ABNORMAL HIGH (ref 65–99)
Potassium: 3.5 mmol/L (ref 3.5–5.1)
Sodium: 140 mmol/L (ref 135–145)

## 2015-06-15 LAB — TROPONIN I: Troponin I: <0.03 ng/mL (ref <0.031)

## 2015-06-15 IMAGING — DX DG CHEST 2V
2 series · 2 of 2 positions shown · non-contrast
Comparison: [DATE]

CLINICAL DATA: History of SVT, pneumonia, diabetes, productive
cough

EXAM:
CHEST  2 VIEW

[chest pa]
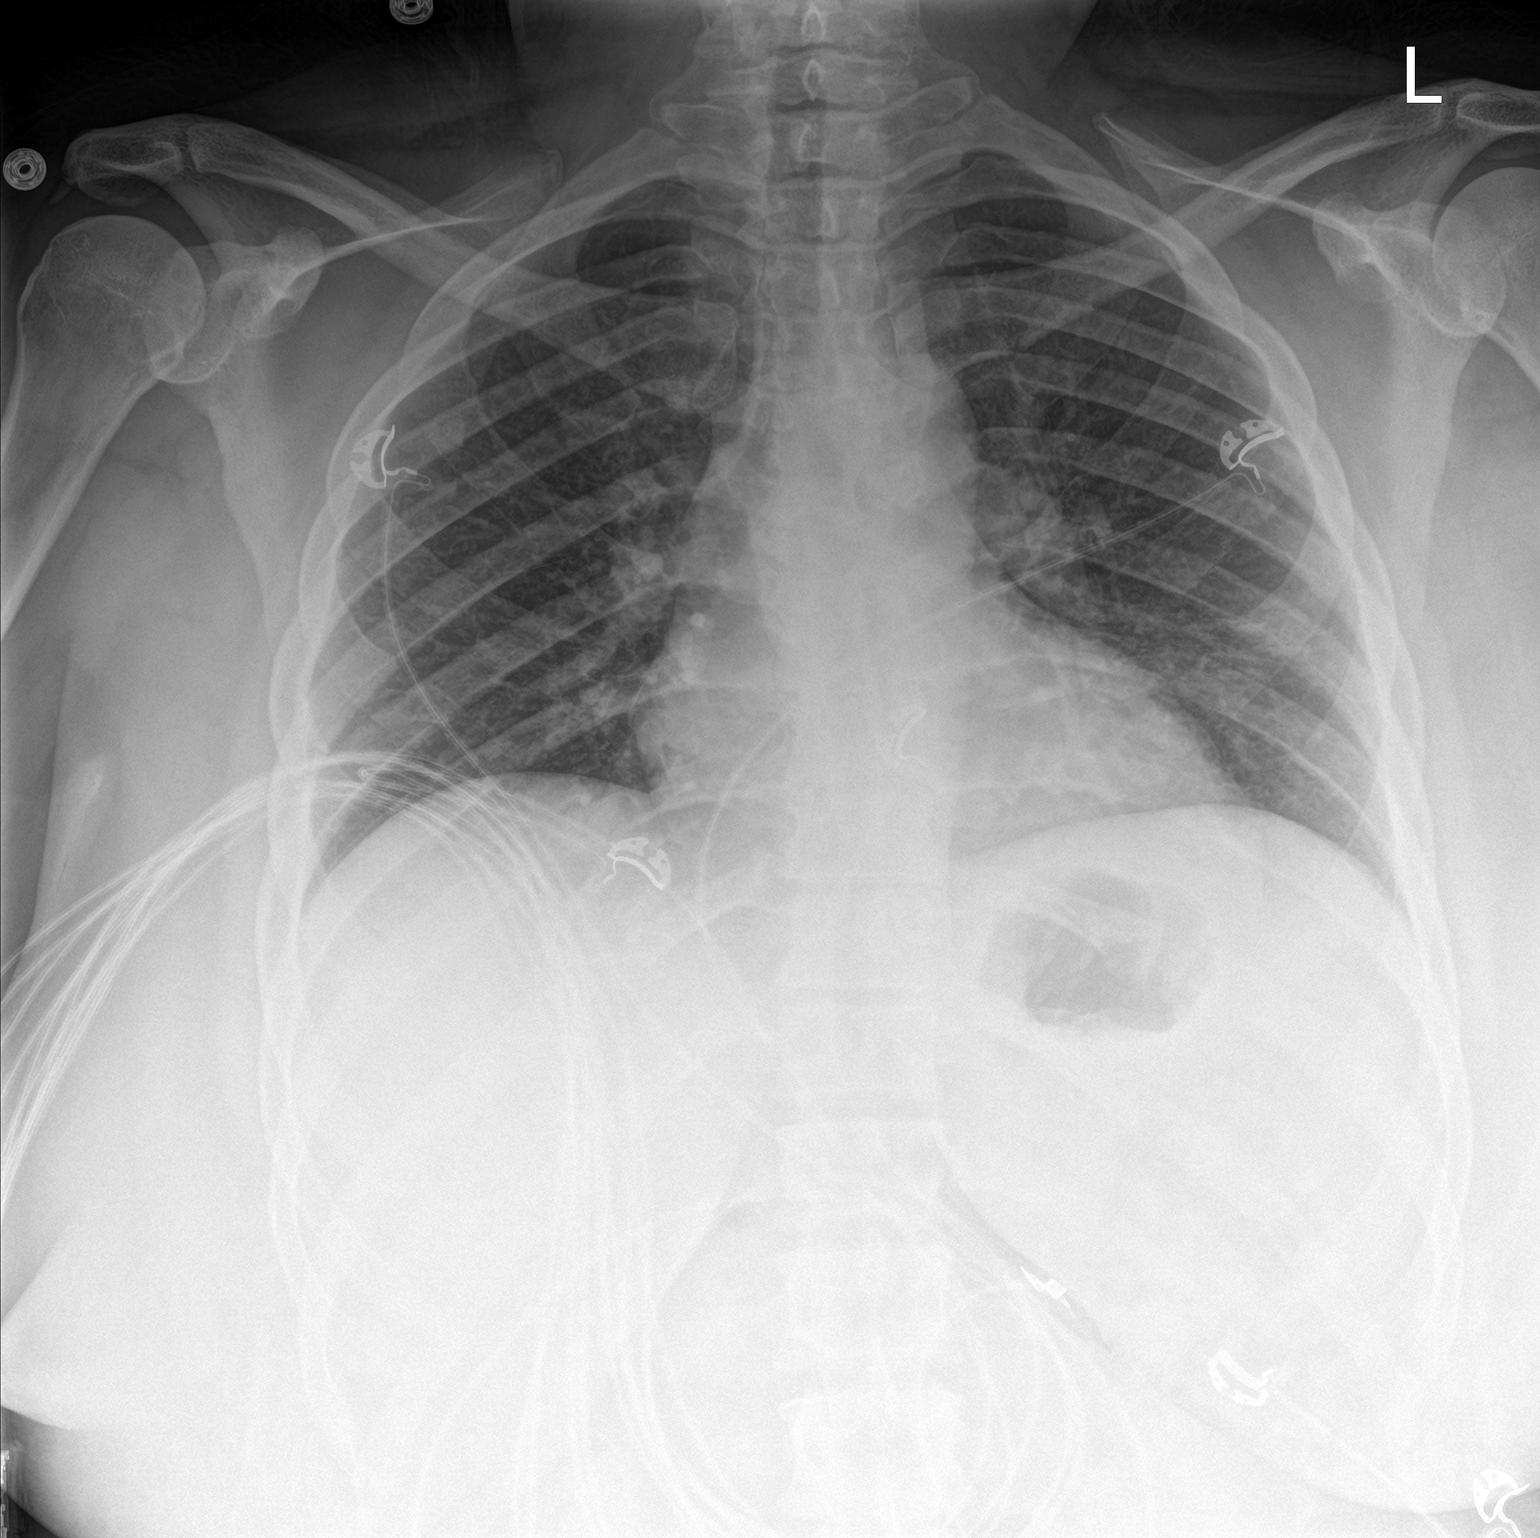

[chest lat]
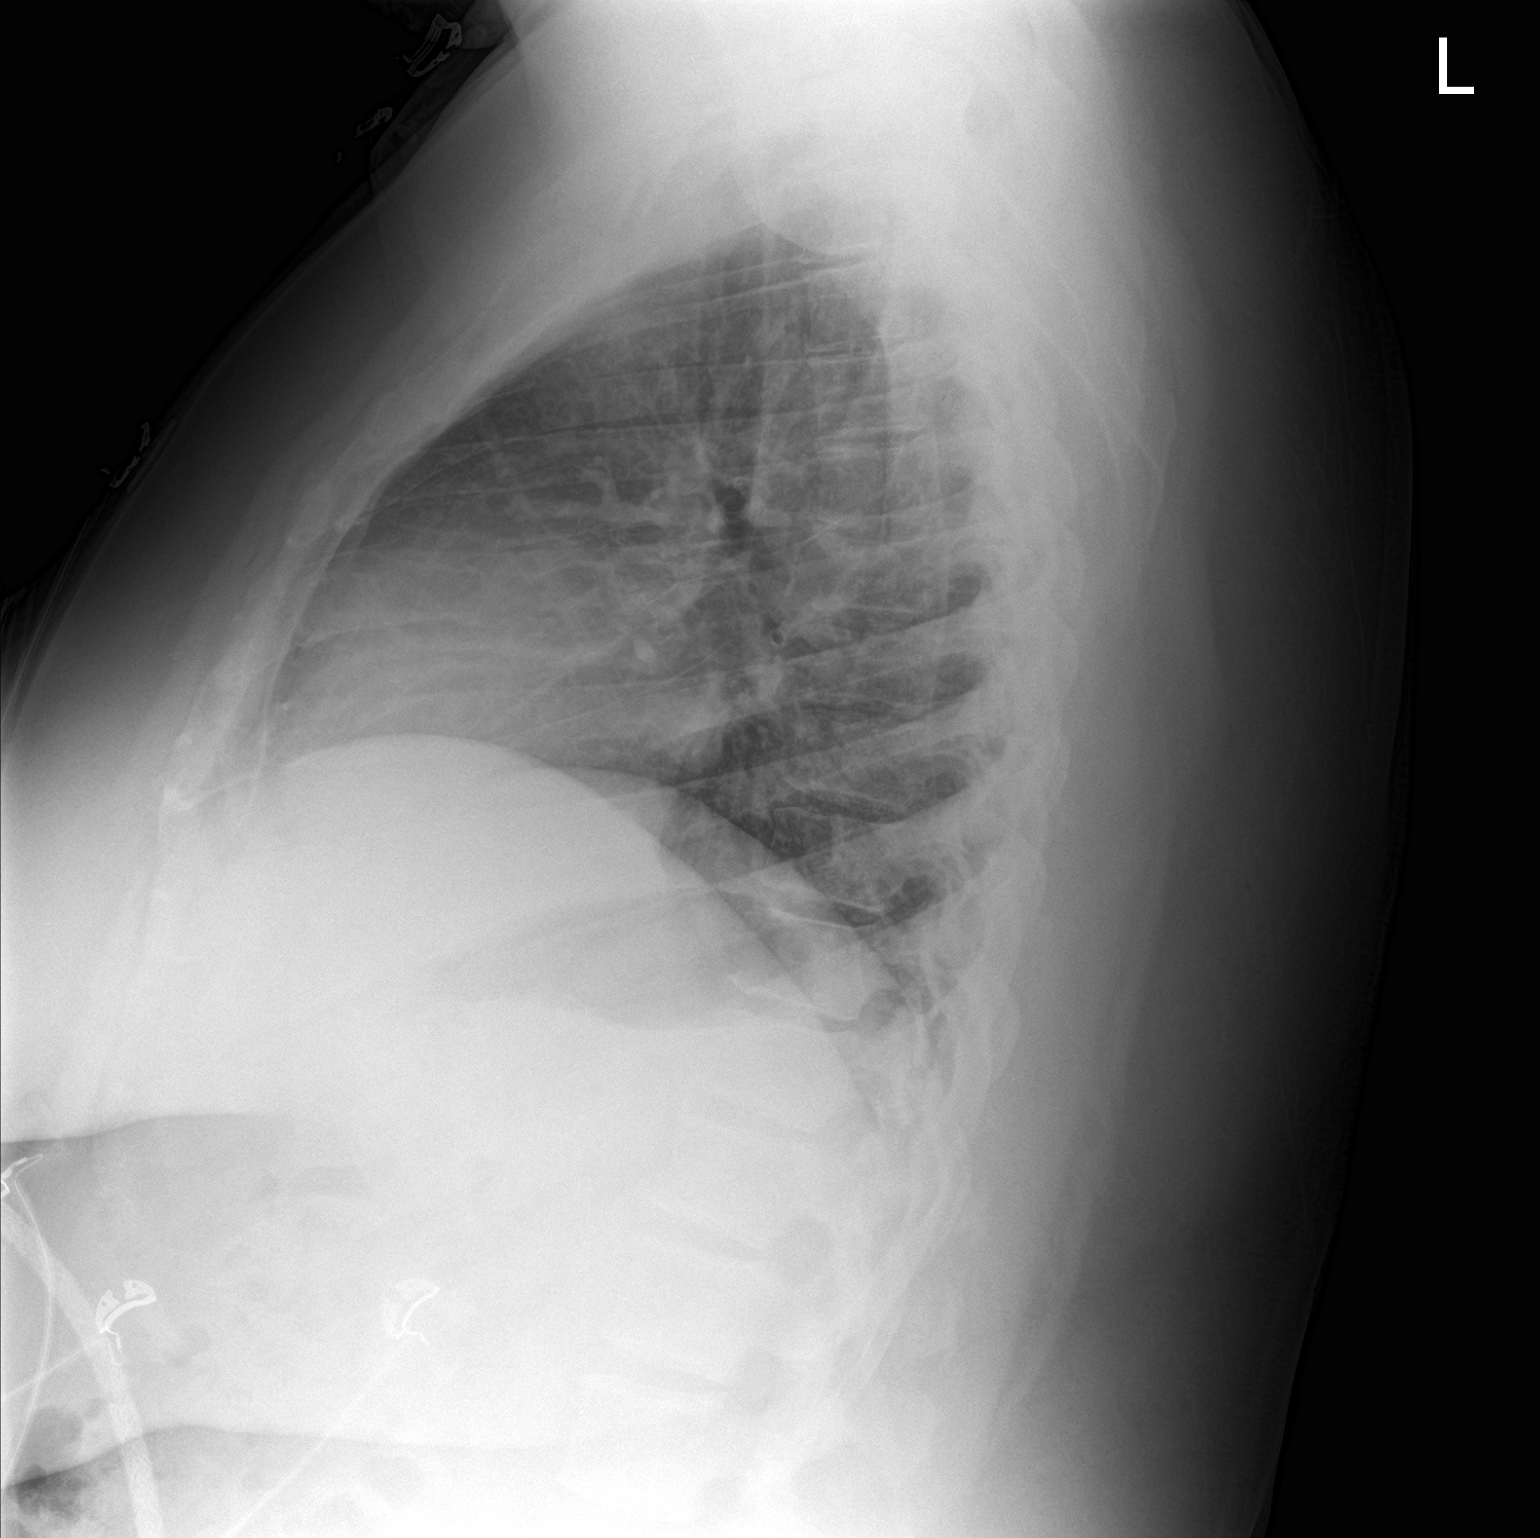

[2 of 2 positions shown; findings below may reference images not displayed]

FINDINGS: Lower lung volumes accentuating the basilar vascular markings. Minor
streaky basilar atelectasis. No definite focal pneumonia, collapse
or consolidation. Negative for edema, effusion or pneumothorax.
Trachea midline. Slight scoliosis of the spine. No acute osseous
finding.
IMPRESSION: Low lung volumes with vascular congestion and basilar atelectasis.

## 2015-06-15 MED ORDER — ADENOSINE 6 MG/2ML IV SOLN
INTRAVENOUS | Status: AC
  Filled 2015-06-15: qty 4

## 2015-06-15 MED ORDER — METOPROLOL SUCCINATE ER 25 MG PO TB24: 37.5000 mg | ORAL_TABLET | Freq: Every day | ORAL | Status: DC

## 2015-06-15 MED ORDER — ADENOSINE 6 MG/2ML IV SOLN
6.0000 mg | Freq: Once | INTRAVENOUS | Status: AC
  Administered 2015-06-15: 6 mg via INTRAVENOUS

## 2015-06-15 NOTE — ED Notes (Signed)
Pt states she is having no pain just shortness of breath.

## 2015-06-15 NOTE — Discharge Instructions (Signed)
Paroxysmal Supraventricular Tachycardia  Paroxysmal supraventricular tachycardia (PSVT) is a type of abnormal heart rhythm. It causes your heart to beat very quickly and then suddenly stop beating so quickly. A normal heart rate is 60-100 beats per minute. During an episode of PSVT, your heart rate may be 150-250 beats per minute. This can make you feel light-headed and short of breath. An episode of PSVT can be frightening. It is usually not dangerous.  The heart has four chambers. All chambers need to work together for the heart to beat effectively. A normal heartbeat usually starts in the right upper chamber of the heart (atrium) when an area (sinoatrial node) puts out an electrical signal that spreads to the other chambers. People with PSVT may have abnormal electrical pathways, or they may have other areas in the upper chambers that send out electrical signals. The result is a very rapid heartbeat.  When your heart beats very quickly, it does not have time to fill completely with blood. When PSVT happens often or it lasts for long periods, it can lead to heart weakness and failure. Most people with PSVT do not have any other heart disease.  CAUSES  Abnormal electrical activity in the heart causes PSVT. It is not known why some people get PSVT and others do not.  RISK FACTORS  You may be more likely to have PSVT if:   You are 20-30 years old.   You are a woman.  Other factors that may increase your chances of an attack include:   Stress.   Being tired.   Smoking.   Stimulant drugs.   Alcoholic drinks.   Caffeine.   Pregnancy.  SIGNS AND SYMPTOMS  A mild episode of PSVT may cause no symptoms. If you do have signs and symptoms, they may include:   A pounding heart.   Feeling of skipped heartbeats (palpitations).   Weakness.   Shortness of breath.   Tightness or pain in your chest.   Light-headedness.   Anxiety.   Dizziness.   Sweating.   Nausea.   A fainting spell.  DIAGNOSIS  Your health care  provider may suspect PSVT if you have symptoms that come and go. The health care provider will do a physical exam. If you are having an episode during the exam, the health care provider may be able to diagnose PSVT by listening to your heart and feeling your pulse. Tests may also be done, including:   An electrical study of your heart (electrocardiogram, or ECG).   A test in which you wear a portable ECG monitor all day (Holter monitor) or for several days (event monitor).   A test that involves taking an image of your heart using sound waves (echocardiogram) to rule out other causes of a fast heart rate.  TREATMENT  You may not need treatment if episodes of PSVT do not happen often or if they do not cause symptoms. If PSVT episodes do cause symptoms, your health care provider may first suggest trying a self-treatment called vagus nerve stimulation. The vagus nerve extends down from the brain. It regulates certain body functions. Stimulating this nerve can slow down the heart. Your health care provider can teach you ways to do this. You may need to try a few ways to find what works best for you. Options include:   Holding your breath and pushing, as though you are having a bowel movement.   Massaging an area on one side of your neck below your jaw.     Medicines to prevent an attack.  Being treated in the hospital with medicine or electric shock to stop an attack (cardioversion). This treatment can include:  Getting medicine through an IV line.  Having a small electric shock delivered to your heart. You will be given medicine to make you sleep through this procedure.  If you have frequent episodes with symptoms, you may need a procedure to get rid of the faulty  areas of your heart (radiofrequency ablation) and end the episodes of PSVT. In this procedure:  A long, thin tube (catheter) is passed through one of your veins into your heart.  Energy directed through the catheter eliminates the areas of your heart that are causing abnormal electric stimulation. HOME CARE INSTRUCTIONS  Take medicines only as directed by your health care provider.  Do not use caffeine in any form if caffeine triggers episodes of PSVT. Otherwise, consume caffeine in moderation. This means no more than a few cups of coffee or the equivalent each day.  Do not drink alcohol if alcohol triggers episodes of PSVT. Otherwise, limit alcohol intake to no more than 1 drink per day for nonpregnant women and 2 drinks per day for men. One drink equals 12 ounces of beer, 5 ounces of wine, or 1 ounces of hard liquor.  Do not use any tobacco products, including cigarettes, chewing tobacco, or electronic cigarettes. If you need help quitting, ask your health care provider.  Try to get at least 7 hours of sleep each night.  Find healthy ways to manage stress.  Perform vagus nerve stimulation as directed by your health care provider.  Maintain a healthy weight.  Get some exercise on most days. Ask your health care provider to suggest some good activities for you. SEEK MEDICAL CARE IF:  You are having episodes of PSVT more often, or they are lasting longer.  Vagus nerve stimulation is no longer helping.  You have new symptoms during an episode. SEEK IMMEDIATE MEDICAL CARE IF:  You have chest pain or trouble breathing.  You have an episode of PSVT that has lasted longer than 20 minutes.  You have passed out from an episode of PSVT. These symptoms may represent a serious problem that is an emergency. Do not wait to see if the symptoms will go away. Get medical help right away. Call your local emergency services (911 in the [place] Do not drive yourself to the hospital.   This  information is not intended to replace advice given to you by your health care provider. Make sure you discuss any questions you have with your health care provider.   Document Released: 06/30/2005 Document Revised: 07/21/2014 Document Reviewed: 12/08/2013 Elsevier Interactive Patient Education 2016 [employer].  Pharmaceutical Cardioversion Pharmaceutical cardioversion is the use of medicine to convert an abnormal heart rhythm back to normal. This treatment may be used when the heart rhythm is not dangerous and vital signs, such as blood pressure, stay in the normal range. Medicine may be used first to treat a fast and irregular heart rhythm (atrial fibrillation). You may have noticed it only because you had mild shortness of breath, were tired, felt dizzy, or felt your heart beating fast.  WHAT CAN I EXPECT?  You may have to stay in the hospital for a few days so your heart rhythm can be monitored all the time.  There are several medicines that may be used depending on the rhythm of your heart.  It may take a combination of medicines before the rhythm  changes.  If medicine does not work, Lobbyistelectrical cardioversion may be used. This involves putting you briefly to sleep and applying two paddles to the front of your chest to deliver a quick electrical current. This adjusts your heart rhythm back to normal.  Once your heart rhythm is normal again, you may still need to take medicine to increase your chances of keeping a regular heart rhythm.   This information is not intended to replace advice given to you by your health care provider. Make sure you discuss any questions you have with your health care provider.   Document Released: 04/21/2006 Document Revised: 04/20/2013 Document Reviewed: 01/05/2013 Elsevier Interactive Patient Education [employer]2016 [employer] Inc.

## 2015-06-15 NOTE — ED Notes (Signed)
MD at bedside. 

## 2015-06-15 NOTE — ED Provider Notes (Signed)
CSN: [id number]     Arrival date & time 06/15/15  [id number] History   First MD Initiated Contact with Patient 06/15/15 0747     Chief Complaint  Patient presents with  . Tachycardia     (Consider location/radiation/quality/duration/timing/severity/associated sxs/prior Treatment) HPI   39 year old female presenting with palpitations. Abrupt onset around 0645 this morning while getting for work. Patient has a past history of SVT. She was evaluated in the emergency room and required administration of adenosine in July, August, September and October. She has followed up with cardiology and had discussion about possible ablation. She is on metoprolol. She reports compliance with her medications and did have a dose already this morning. Denies any significant caffeine intake. Denies any pain. No shortness of breath. Does feel mildly anxious. Tried vagal maneuvers without success.  Past Medical History  Diagnosis Date  . Diabetes mellitus without complication (HCC)   . Hypertension   . Anemia   . SVT (supraventricular tachycardia) (HCC)    History reviewed. No pertinent past surgical history. Family History  Problem Relation Age of Onset  . Diabetes Mother   . Heart failure Mother   . Diabetes Other    Social History  Substance Use Topics  . Smoking status: Never Smoker   . Smokeless tobacco: Never Used  . Alcohol Use: No   OB History    Gravida Para Term Preterm AB TAB SAB Ectopic Multiple Living   1    1  1    0     Review of Systems  All systems reviewed and negative, other than as noted in HPI.   Allergies  Lisinopril  Home Medications   Prior to Admission medications   Medication Sig Start Date End Date Taking? Authorizing Provider  aspirin EC 81 MG tablet Take 81 mg by mouth daily as needed for mild pain (/palpitations).    Historical Provider, MD  ferrous sulfate 325 (65 FE) MG tablet Take 325 mg by mouth daily with breakfast.    Historical Provider, MD  insulin  glargine (LANTUS) 100 UNIT/ML injection Inject 0.1 mLs (10 Units total) into the skin at bedtime. Patient taking differently: Inject 15 Units into the skin at bedtime.  03/26/15   Erick BlinksJehanzeb Memon, MD  insulin glargine (LANTUS) 100 UNIT/ML injection Inject 0.5 mLs (50 Units total) into the skin daily. Patient taking differently: Inject 50 Units into the skin every morning.  03/26/15   Erick BlinksJehanzeb Memon, MD  losartan (COZAAR) 50 MG tablet Take 50 mg by mouth daily.    Historical Provider, MD  metFORMIN (GLUCOPHAGE) 1000 MG tablet Take 1,000 mg by mouth 2 (two) times daily with a meal.    Historical Provider, MD  metoprolol succinate (TOPROL XL) 25 MG 24 hr tablet Take 1 tablet (25 mg total) by mouth daily. 03/31/15   Hillis RangeJames Allred, MD  metoprolol succinate (TOPROL-XL) 25 MG 24 hr tablet Take 0.5 tablets (12.5 mg total) by mouth daily. 05/08/15   Zadie Rhineonald Wickline, MD  metoprolol tartrate (LOPRESSOR) 25 MG tablet Take 1 tablet (25 mg total) by mouth daily as needed (palpitations). 04/02/15   Hillis RangeJames Allred, MD  potassium chloride SA (K-DUR,KLOR-CON) 10 MEQ tablet Take 4 tablets (40 mEq total) by mouth daily. 03/26/15   Erick BlinksJehanzeb Memon, MD   BP 183/95 mmHg  Pulse 94  Temp(Src) 97.7 F (36.5 C) (Oral)  Resp 22  Ht 5\' 11"  (1.803 m)  Wt 280 lb (127.007 kg)  BMI 39.07 kg/m2  SpO2 100%  LMP 06/03/2015 Physical Exam  Constitutional: She appears well-developed and well-nourished. No distress.  HENT:  Head: Normocephalic and atraumatic.  Eyes: Conjunctivae are normal. Right eye exhibits no discharge. Left eye exhibits no discharge.  Neck: Neck supple.  Cardiovascular: Normal rate, regular rhythm and normal heart sounds.  Exam reveals no gallop and no friction rub.   No murmur heard. Marked tachycardia. Seems regular.  Pulmonary/Chest: Effort normal and breath sounds normal. No respiratory distress.  Speaking in complete sentences  Abdominal: Soft. She exhibits no distension. There is no tenderness.   Musculoskeletal: She exhibits no edema or tenderness.  Neurological: She is alert.  Skin: Skin is warm and dry.  Psychiatric: She has a normal mood and affect. Her behavior is normal. Thought content normal.  Nursing note and vitals reviewed.   ED Course  .Cardioversion Date/Time: 06/15/2015 7:45 AM Performed by: Raeford Razor Authorized by: Raeford Razor Consent: Verbal consent obtained. Risks and benefits: risks, benefits and alternatives were discussed Consent given by: patient Patient identity confirmed: verbally with patient (the patient in SVT) Patient sedated: no Cardioversion basis: emergent Pre-procedure rhythm: supraventricular tachycardia Patient position: patient was placed in a supine position Number of attempts: 1 Post-procedure rhythm: normal sinus rhythm Complications: no complications Patient tolerance: Patient tolerated the procedure well with no immediate complications Comments: Chemical cardioversion. Adenosine  x1.    (including critical care time)  CRITICAL CARE Performed by: Raeford Razor Total critical care time: 30 minutes Critical care time was exclusive of separately billable procedures and treating other patients. Critical care was necessary to treat or prevent imminent or life-threatening deterioration. Critical care was time spent personally by me on the following activities: development of treatment plan with patient and/or surrogate as well as nursing, discussions with consultants, evaluation of patient's response to treatment, examination of patient, obtaining history from patient or surrogate, ordering and performing treatments and interventions, ordering and review of laboratory studies, ordering and review of radiographic studies, pulse oximetry and re-evaluation of patient's condition.   Labs Review Labs Reviewed  CBC WITH DIFFERENTIAL/PLATELET - Abnormal; Notable for the following:    Platelets 471 (*)    All other components within  normal limits  BASIC METABOLIC PANEL - Abnormal; Notable for the following:    Glucose, Bld 150 (*)    All other components within normal limits  TROPONIN I    Imaging Review No results found. I have personally reviewed and evaluated these images and lab results as part of my medical decision-making.   EKG Interpretation   Date/Time:  Friday June 15 2015 07:46:27 EST Ventricular Rate:  98 PR Interval:  193 QRS Duration: 85 QT Interval:  345 QTC Calculation: 440 R Axis:   7 Text Interpretation:  Sinus rhythm Consider left atrial enlargement Left  ventricular hypertrophy ED PHYSICIAN INTERPRETATION AVAILABLE IN [facility] Confirmed by TEST, Record ([id number]) on 06/16/2015 9:56:48 AM      MDM   Final diagnoses:  SVT (supraventricular tachycardia) (HCC)    39 year old female with SVT. History of the same. Attempted vagal maneuvers without conversion. She was given 6 mg of adenosine 1 with conversion to sinus rhythm. Symptoms resolved. Patient is on a beta blocker and reports compliance. Review of vitals over past year show persistent HTN and no significant bradycardia. Will have increase metoprolol XL from  to 37.5mg .    Raeford Razor, MD 06/17/15 [phone]

## 2015-06-15 NOTE — ED Notes (Signed)
Pt heart rate 115.

## 2015-06-15 NOTE — ED Notes (Signed)
Started with rapid heart rate at 0645.  C/o SOB and denies any pain.  History of SVT.

## 2015-06-25 ENCOUNTER — Emergency Department (HOSPITAL_COMMUNITY)
Admission: EM | Admit: 2015-06-25 | Discharge: 2015-06-25 | Disposition: A | Discharge status: Home / Self Care | Payer: Self-pay | Attending: Emergency Medicine | Admitting: Emergency Medicine

## 2015-06-25 ENCOUNTER — Encounter (HOSPITAL_COMMUNITY): Payer: Self-pay | Admitting: *Deleted

## 2015-06-25 DIAGNOSIS — Z7982 Long term (current) use of aspirin: Secondary | ICD-10-CM | POA: Insufficient documentation

## 2015-06-25 DIAGNOSIS — E119 Type 2 diabetes mellitus without complications: Secondary | ICD-10-CM | POA: Insufficient documentation

## 2015-06-25 DIAGNOSIS — I10 Essential (primary) hypertension: Secondary | ICD-10-CM | POA: Insufficient documentation

## 2015-06-25 DIAGNOSIS — D649 Anemia, unspecified: Secondary | ICD-10-CM | POA: Insufficient documentation

## 2015-06-25 DIAGNOSIS — Z79899 Other long term (current) drug therapy: Secondary | ICD-10-CM | POA: Insufficient documentation

## 2015-06-25 DIAGNOSIS — I471 Supraventricular tachycardia: Secondary | ICD-10-CM | POA: Insufficient documentation

## 2015-06-25 DIAGNOSIS — Z7984 Long term (current) use of oral hypoglycemic drugs: Secondary | ICD-10-CM | POA: Insufficient documentation

## 2015-06-25 DIAGNOSIS — Z794 Long term (current) use of insulin: Secondary | ICD-10-CM | POA: Insufficient documentation

## 2015-06-25 LAB — CBC WITH DIFFERENTIAL/PLATELET
Basophils Absolute: 0 10*3/uL (ref 0.0–0.1)
Basophils Relative: 0 %
Eosinophils Absolute: 0.2 10*3/uL (ref 0.0–0.7)
Eosinophils Relative: 2 %
HEMATOCRIT: 39.8 % (ref 36.0–46.0)
HEMOGLOBIN: 13.3 g/dL (ref 12.0–15.0)
LYMPHS PCT: 38 %
Lymphs Abs: 3.8 10*3/uL (ref 0.7–4.0)
MCH: 30.6 pg (ref 26.0–34.0)
MCHC: 33.4 g/dL (ref 30.0–36.0)
MCV: 91.5 fL (ref 78.0–100.0)
MONO ABS: 0.5 10*3/uL (ref 0.1–1.0)
Monocytes Relative: 5 %
NEUTROS ABS: 5.7 10*3/uL (ref 1.7–7.7)
Neutrophils Relative %: 55 %
Platelets: 504 10*3/uL — ABNORMAL HIGH (ref 150–400)
RBC: 4.35 MIL/uL (ref 3.87–5.11)
RDW: 13.7 % (ref 11.5–15.5)
WBC: 10.2 10*3/uL (ref 4.0–10.5)

## 2015-06-25 LAB — BASIC METABOLIC PANEL
ANION GAP: 11 (ref 5–15)
BUN: 14 mg/dL (ref 6–20)
CHLORIDE: 102 mmol/L (ref 101–111)
CO2: 26 mmol/L (ref 22–32)
Calcium: 10.1 mg/dL (ref 8.9–10.3)
Creatinine, Ser: 0.84 mg/dL (ref 0.44–1.00)
GFR calc Af Amer: >60 mL/min (ref >60)
GFR calc non Af Amer: >60 mL/min (ref >60)
GLUCOSE: 151 mg/dL — AB (ref 65–99)
POTASSIUM: 3.3 mmol/L — AB (ref 3.5–5.1)
Sodium: 139 mmol/L (ref 135–145)

## 2015-06-25 MED ORDER — ADENOSINE 6 MG/2ML IV SOLN
6.0000 mg | Freq: Once | INTRAVENOUS | Status: AC
  Administered 2015-06-25: 6 mg via INTRAVENOUS

## 2015-06-25 MED ORDER — SODIUM CHLORIDE 0.9 % IV SOLN
INTRAVENOUS | Status: DC
  Administered 2015-06-25: 1000 mL via INTRAVENOUS

## 2015-06-25 MED ORDER — SODIUM CHLORIDE 0.9 % IV BOLUS (SEPSIS)
250.0000 mL | Freq: Once | INTRAVENOUS | Status: AC
  Administered 2015-06-25: 250 mL via INTRAVENOUS

## 2015-06-25 MED ORDER — ADENOSINE 6 MG/2ML IV SOLN
INTRAVENOUS | Status: AC
  Administered 2015-06-25: 6 mg via INTRAVENOUS
  Filled 2015-06-25: qty 2

## 2015-06-25 MED ORDER — LORAZEPAM 2 MG/ML IJ SOLN
1.0000 mg | Freq: Once | INTRAMUSCULAR | Status: DC
Start: 2015-06-25 — End: 2015-06-26

## 2015-06-25 MED ORDER — LORAZEPAM 2 MG/ML IJ SOLN
1.0000 mg | Freq: Once | INTRAMUSCULAR | Status: AC
  Administered 2015-06-25: 1 mg via INTRAVENOUS
  Filled 2015-06-25: qty 1

## 2015-06-25 NOTE — ED Provider Notes (Addendum)
CSN: [id number]     Arrival date & time 06/25/15  2021 History   First MD Initiated Contact with Patient 06/25/15 2042     Chief Complaint  Patient presents with  . Tachycardia     (Consider location/radiation/quality/duration/timing/severity/associated sxs/prior Treatment) The history is provided by the patient.   patient 39 years old rapid onset heart rate occurring shortly before arrival. Patient seen a week ago with the same problem. Patient is having troubles with ventricular tachycardia on and off at times since July. Patient denies any shortness of breath or chest pain. Patient is being followed by cardiology. They're waiting further evaluation until she gets her insurance situation sorted out. Patient arrived here with a heart rate around 154. Patient is not taking any caffeine patient is not known to have any thyroid problem. Patient has had no recent illness. Patient past medical history is significant for diabetes and hypertension.  Past Medical History  Diagnosis Date  . Diabetes mellitus without complication (HCC)   . Hypertension   . Anemia   . SVT (supraventricular tachycardia) (HCC)    History reviewed. No pertinent past surgical history. Family History  Problem Relation Age of Onset  . Diabetes Mother   . Heart failure Mother   . Diabetes Other    Social History  Substance Use Topics  . Smoking status: Never Smoker   . Smokeless tobacco: Never Used  . Alcohol Use: No   OB History    Gravida Para Term Preterm AB TAB SAB Ectopic Multiple Living   0     Review of Systems  Constitutional: Negative for fever.  HENT: Negative for congestion.   Eyes: Negative for visual disturbance.  Respiratory: Negative for shortness of breath.   Cardiovascular: Positive for palpitations. Negative for chest pain.  Gastrointestinal: Negative for nausea, vomiting and abdominal pain.  Genitourinary: Negative for dysuria.  Musculoskeletal: Negative for back pain.   Skin: Negative for rash.  Neurological: Negative for headaches.  Hematological: Does not bruise/bleed easily.  Psychiatric/Behavioral: Negative for confusion.      Allergies  Lisinopril  Home Medications   Prior to Admission medications   Medication Sig Start Date End Date Taking? Authorizing Provider  aspirin EC 81 MG tablet Take 81 mg by mouth daily as needed for mild pain (/palpitations).   Yes Historical Provider, MD  ferrous sulfate 325 (65 FE) MG tablet Take 325 mg by mouth daily with breakfast.   Yes Historical Provider, MD  insulin glargine (LANTUS) 100 UNIT/ML injection Inject 0.1 mLs (10 Units total) into the skin at bedtime. Patient taking differently: Inject 10-50 Units into the skin 2 (two) times daily. Takes 50 units in the morning and 10 units at bedtime. 03/26/15  Yes Erick Blinks, MD  losartan (COZAAR) 50 MG tablet Take 50 mg by mouth daily.   Yes Historical Provider, MD  metFORMIN (GLUCOPHAGE) 1000 MG tablet Take 1,000 mg by mouth 2 (two) times daily with a meal.   Yes Historical Provider, MD  metoprolol succinate (TOPROL-XL) 25 MG 24 hr tablet Take 1.5 tablets (37.5 mg total) by mouth daily. 06/15/15  Yes Raeford Razor, MD  metoprolol tartrate (LOPRESSOR) 25 MG tablet Take 1 tablet (25 mg total) by mouth daily as needed (palpitations). 04/02/15  Yes [name], MD  Multiple Vitamin (MULTIVITAMIN WITH MINERALS) TABS tablet Take 1 tablet by mouth daily.   Yes Historical Provider, MD  metoprolol succinate (TOPROL XL) 25 MG 24 hr tablet  Take 1 tablet (25 mg total) by mouth daily. Patient not taking: Reported on 06/25/2015 03/31/15   Hillis RangeJames Allred, MD  potassium chloride SA (K-DUR,KLOR-CON) 10 MEQ tablet Take 4 tablets (40 mEq total) by mouth daily. Patient not taking: Reported on 06/15/2015 03/26/15   Erick BlinksJehanzeb Memon, MD   BP 153/112 mmHg  Pulse 100  Temp(Src) 98.3 F (36.8 C) (Oral)  Resp 15  Ht 5\' 11"  (1.803 m)  Wt 127.007 kg  BMI 39.07 kg/m2  SpO2 96%  LMP  06/03/2015 Physical Exam  Constitutional: She appears well-developed and well-nourished. No distress.  HENT:  Head: Normocephalic and atraumatic.  Mouth/Throat: Oropharynx is clear and moist.  Eyes: EOM are normal. Pupils are equal, round, and reactive to light.  Neck: Normal range of motion. Neck supple.  Cardiovascular: Regular rhythm.   No murmur heard. Tachycardic  Pulmonary/Chest: Effort normal and breath sounds normal. No respiratory distress.  Abdominal: Soft. Bowel sounds are normal. There is no tenderness.  Musculoskeletal: Normal range of motion. She exhibits no edema.  Neurological: She is alert. No cranial nerve deficit. She exhibits normal muscle tone. Coordination normal.  Skin: Skin is warm. No erythema.  Nursing note and vitals reviewed.   ED Course  Procedures (including critical care time) Labs Review Labs Reviewed  BASIC METABOLIC PANEL - Abnormal; Notable for the following:    Potassium 3.3 (*)    Glucose, Bld 151 (*)    All other components within normal limits  CBC WITH DIFFERENTIAL/PLATELET - Abnormal; Notable for the following:    Platelets 504 (*)    All other components within normal limits   Results for orders placed or performed during the hospital encounter of 06/25/15  Basic metabolic panel  Result Value Ref Range   Sodium 139 135 - 145 mmol/L   Potassium 3.3 (L) 3.5 - 5.1 mmol/L   Chloride 102 101 - 111 mmol/L   CO2 26 22 - 32 mmol/L   Glucose, Bld 151 (H) 65 - 99 mg/dL   BUN 14 6 - 20 mg/dL   Creatinine, Ser 1.610.84 0.44 - 1.00 mg/dL   Calcium 09.610.1 8.9 - 04.510.3 mg/dL   GFR calc non Af Amer >60 >60 mL/min   GFR calc Af Amer >60 >60 mL/min   Anion gap 11 5 - 15  CBC with Differential/Platelet  Result Value Ref Range   WBC 10.2 4.0 - 10.5 K/uL   RBC 4.35 3.87 - 5.11 MIL/uL   Hemoglobin 13.3 12.0 - 15.0 g/dL   HCT 40.939.8 81.136.0 - 91.446.0 %   MCV 91.5 78.0 - 100.0 fL   MCH 30.6 26.0 - 34.0 pg   MCHC 33.4 30.0 - 36.0 g/dL   RDW 78.213.7 95.611.5 - 21.315.5 %    Platelets 504 (H) 150 - 400 K/uL   Neutrophils Relative % 55 %   Neutro Abs 5.7 1.7 - 7.7 K/uL   Lymphocytes Relative 38 %   Lymphs Abs 3.8 0.7 - 4.0 K/uL   Monocytes Relative 5 %   Monocytes Absolute 0.5 0.1 - 1.0 K/uL   Eosinophils Relative 2 %   Eosinophils Absolute 0.2 0.0 - 0.7 K/uL   Basophils Relative 0 %   Basophils Absolute 0.0 0.0 - 0.1 K/uL     Imaging Review No results found. I have personally reviewed and evaluated these images and lab results as part of my medical decision-making.   EKG Interpretation   Date/Time:  Monday June 25 2015 20:41:38 EST Ventricular Rate:  119 PR Interval:  165 QRS Duration: 88 QT Interval:  275 QTC Calculation: 387 R Axis:   8 Text Interpretation:  Sinus tachycardia LVH with secondary repolarization  abnormality Anterior Q waves, possibly due to LVH Baseline wander in  lead(s) II aVF Confirmed by Modestine Scherzinger  MD, Cybill Uriegas ([id number]) on 06/25/2015  8:49:34 PM         CRITICAL CARE Performed by: Vanetta Mulders Total critical care time: 30 minutes Critical care time was exclusive of separately billable procedures and treating other patients. Critical care was necessary to treat or prevent imminent or life-threatening deterioration. Critical care was time spent personally by me on the following activities: development of treatment plan with patient and/or surrogate as well as nursing, discussions with consultants, evaluation of patient's response to treatment, examination of patient, obtaining history from patient or surrogate, ordering and performing treatments and interventions, ordering and review of laboratory studies, ordering and review of radiographic studies, pulse oximetry and re-evaluation of patient's condition.  MDM   Final diagnoses:  SVT (supraventricular tachycardia) (HCC)    Patient presenting with rapid heart rate in the 150s. Consistent with superventricular tachycardia. Patient had an episode of this a week ago  usually response to adenosine. The patient had a proximal IV started in her right antecubital area. Patient received 6 mg of adenosine now with pausing conversion back to sinus rhythm. Initially it was a sinus tach now heart rates down into the 90s. Patient without any shortness of breath or chest pain complaints. Patient without any electrolyte abnormalities. Patient is followed by cardiology. She is working on insurance issues and then cardiology will be looking into her potential etiologies for this more closely.Electrolytes do show some very mild hypokalemia at 3.3. This should not be significant enough to cause a problem.    Vanetta Mulders, MD 06/25/15 2139  Vanetta Mulders, MD 06/25/15 [phone]

## 2015-06-25 NOTE — ED Notes (Signed)
Pt c/o feeling like her heart was fluttering when she went to lay down

## 2015-06-25 NOTE — Discharge Instructions (Signed)
Return for any recurrent fast heart rate. Keep your follow-up with cardiology.

## 2015-06-25 NOTE — ED Notes (Signed)
Pt states that she felt her hr increase this evening, denies any pain, admits to sob, has hx of svt,

## 2015-06-25 NOTE — ED Notes (Signed)
Pt alert & oriented x4, stable gait. Patient given discharge instructions, paperwork & prescription(s). Patient  instructed to stop at the registration desk to finish any additional paperwork. Patient  verbalized understanding. Pt left department in wheelchair escroted by staff. Pt left ED w/ no further questions.

## 2015-08-18 ENCOUNTER — Other Ambulatory Visit: Payer: Self-pay | Admitting: Internal Medicine

## 2015-08-20 ENCOUNTER — Emergency Department (HOSPITAL_COMMUNITY): Payer: Self-pay

## 2015-08-20 ENCOUNTER — Encounter (HOSPITAL_COMMUNITY): Payer: Self-pay | Admitting: *Deleted

## 2015-08-20 ENCOUNTER — Emergency Department (HOSPITAL_COMMUNITY)
Admission: EM | Admit: 2015-08-20 | Discharge: 2015-08-20 | Disposition: A | Discharge status: Home / Self Care | Payer: Self-pay | Attending: Emergency Medicine | Admitting: Emergency Medicine

## 2015-08-20 DIAGNOSIS — Z7982 Long term (current) use of aspirin: Secondary | ICD-10-CM | POA: Insufficient documentation

## 2015-08-20 DIAGNOSIS — Z794 Long term (current) use of insulin: Secondary | ICD-10-CM | POA: Insufficient documentation

## 2015-08-20 DIAGNOSIS — Z79899 Other long term (current) drug therapy: Secondary | ICD-10-CM | POA: Insufficient documentation

## 2015-08-20 DIAGNOSIS — Z7984 Long term (current) use of oral hypoglycemic drugs: Secondary | ICD-10-CM | POA: Insufficient documentation

## 2015-08-20 DIAGNOSIS — E119 Type 2 diabetes mellitus without complications: Secondary | ICD-10-CM | POA: Insufficient documentation

## 2015-08-20 DIAGNOSIS — D649 Anemia, unspecified: Secondary | ICD-10-CM | POA: Insufficient documentation

## 2015-08-20 DIAGNOSIS — I471 Supraventricular tachycardia: Secondary | ICD-10-CM | POA: Insufficient documentation

## 2015-08-20 DIAGNOSIS — I10 Essential (primary) hypertension: Secondary | ICD-10-CM | POA: Insufficient documentation

## 2015-08-20 LAB — COMPREHENSIVE METABOLIC PANEL
ALT: 21 U/L (ref 14–54)
AST: 22 U/L (ref 15–41)
Albumin: 3.7 g/dL (ref 3.5–5.0)
Alkaline Phosphatase: 88 U/L (ref 38–126)
Anion gap: 11 (ref 5–15)
BUN: 14 mg/dL (ref 6–20)
CHLORIDE: 102 mmol/L (ref 101–111)
CO2: 25 mmol/L (ref 22–32)
Calcium: 9 mg/dL (ref 8.9–10.3)
Creatinine, Ser: 0.77 mg/dL (ref 0.44–1.00)
Glucose, Bld: 151 mg/dL — ABNORMAL HIGH (ref 65–99)
POTASSIUM: 4 mmol/L (ref 3.5–5.1)
SODIUM: 138 mmol/L (ref 135–145)
Total Bilirubin: 0.3 mg/dL (ref 0.3–1.2)
Total Protein: 8.1 g/dL (ref 6.5–8.1)

## 2015-08-20 LAB — CBC WITH DIFFERENTIAL/PLATELET
BASOS ABS: 0 10*3/uL (ref 0.0–0.1)
Basophils Relative: 0 %
Eosinophils Absolute: 0.1 10*3/uL (ref 0.0–0.7)
Eosinophils Relative: 1 %
HCT: 38.4 % (ref 36.0–46.0)
HEMOGLOBIN: 12.5 g/dL (ref 12.0–15.0)
LYMPHS ABS: 3 10*3/uL (ref 0.7–4.0)
LYMPHS PCT: 28 %
MCH: 29.9 pg (ref 26.0–34.0)
MCHC: 32.6 g/dL (ref 30.0–36.0)
MCV: 91.9 fL (ref 78.0–100.0)
Monocytes Absolute: 0.4 10*3/uL (ref 0.1–1.0)
Monocytes Relative: 4 %
NEUTROS ABS: 7.1 10*3/uL (ref 1.7–7.7)
NEUTROS PCT: 67 %
PLATELETS: 453 10*3/uL — AB (ref 150–400)
RBC: 4.18 MIL/uL (ref 3.87–5.11)
RDW: 13.7 % (ref 11.5–15.5)
WBC: 10.6 10*3/uL — AB (ref 4.0–10.5)

## 2015-08-20 LAB — TROPONIN I

## 2015-08-20 MED ORDER — ADENOSINE 6 MG/2ML IV SOLN
6.0000 mg | Freq: Once | INTRAVENOUS | Status: AC
  Administered 2015-08-20: 6 mg via INTRAVENOUS
  Filled 2015-08-20: qty 2

## 2015-08-20 MED ORDER — ADENOSINE 6 MG/2ML IV SOLN
12.0000 mg | Freq: Once | INTRAVENOUS | Status: AC
  Administered 2015-08-20: 12 mg via INTRAVENOUS

## 2015-08-20 MED ORDER — ADENOSINE 6 MG/2ML IV SOLN
INTRAVENOUS | Status: AC
  Filled 2015-08-20: qty 4

## 2015-08-20 NOTE — Discharge Instructions (Signed)
Call your cardiologist tomorrow and let the doctor know that you had SVT and needed adenosine to convert you to sinus

## 2015-08-20 NOTE — ED Notes (Addendum)
Pt had to be helped out of car, diaphoretic, and tachycardiac per friend.  Reports hx of SVT.

## 2015-08-20 NOTE — ED Notes (Signed)
MD at bedside. 

## 2015-08-20 NOTE — ED Notes (Signed)
Pt converted to sinus tach on second dose of Adenocard.  States that she feels back to normal at this time.  See rhythm strip for details.

## 2015-08-20 NOTE — ED Provider Notes (Signed)
CSN: [id number]     Arrival date & time 08/20/15  1522 History   First MD Initiated Contact with Patient 08/20/15 1526     Chief Complaint  Patient presents with  . Tachycardia     (Consider location/radiation/quality/duration/timing/severity/associated sxs/prior Treatment) Patient is a 40 y.o. female presenting with palpitations. The history is provided by the patient (The patient states that she noticed her heart beating fast around noon today. This is happened several times and she has had to get adenosine 7 different times).  Palpitations Palpitations quality:  Regular Onset quality:  Sudden Timing:  Constant Progression:  Unchanged Chronicity:  Recurrent Context: not anxiety   Relieved by:  Nothing Worsened by:  Nothing Associated symptoms: no back pain, no chest pain and no cough     Past Medical History  Diagnosis Date  . Diabetes mellitus without complication (HCC)   . Hypertension   . Anemia   . SVT (supraventricular tachycardia) (HCC)    History reviewed. No pertinent past surgical history. Family History  Problem Relation Age of Onset  . Diabetes Mother   . Heart failure Mother   . Diabetes Other    Social History  Substance Use Topics  . Smoking status: Never Smoker   . Smokeless tobacco: Never Used  . Alcohol Use: No   OB History    Gravida Para Term Preterm AB TAB SAB Ectopic Multiple Living   0     Review of Systems  Constitutional: Negative for appetite change and fatigue.  HENT: Negative for congestion, ear discharge and sinus pressure.   Eyes: Negative for discharge.  Respiratory: Negative for cough.   Cardiovascular: Positive for palpitations. Negative for chest pain.  Gastrointestinal: Negative for abdominal pain and diarrhea.  Genitourinary: Negative for frequency and hematuria.  Musculoskeletal: Negative for back pain.  Skin: Negative for rash.  Neurological: Negative for seizures and headaches.  Psychiatric/Behavioral:  Negative for hallucinations.      Allergies  Lisinopril  Home Medications   Prior to Admission medications   Medication Sig Start Date End Date Taking? Authorizing Provider  aspirin EC 81 MG tablet Take 81 mg by mouth daily as needed for mild pain (/palpitations).   Yes Historical Provider, MD  ferrous sulfate 325 (65 FE) MG tablet Take 325 mg by mouth daily with breakfast.   Yes Historical Provider, MD  insulin aspart (NOVOLOG) 100 UNIT/ML injection Inject 5 Units into the skin 3 (three) times daily before meals. Per sliding scale instructions   Yes Historical Provider, MD  insulin glargine (LANTUS) 100 UNIT/ML injection Inject 0.1 mLs (10 Units total) into the skin at bedtime. Patient taking differently: Inject 10-50 Units into the skin 2 (two) times daily. Takes 50 units in the morning and 10 units at bedtime. 03/26/15  Yes Erick Blinks, MD  losartan (COZAAR) 50 MG tablet Take 50 mg by mouth daily.   Yes Historical Provider, MD  metFORMIN (GLUCOPHAGE) 1000 MG tablet Take 1,000 mg by mouth 2 (two) times daily with a meal.   Yes Historical Provider, MD  metoprolol succinate (TOPROL-XL) 25 MG 24 hr tablet Take 1.5 tablets (37.5 mg total) by mouth daily. Patient taking differently: Take 25 mg by mouth daily.  06/15/15  Yes Raeford Razor, MD  metoprolol tartrate (LOPRESSOR) 25 MG tablet Take 1 tablet (25 mg total) by mouth daily as needed (palpitations). 04/02/15  Yes [name], MD  Multiple Vitamin (MULTIVITAMIN WITH MINERALS) TABS tablet Take 1  tablet by mouth daily.   Yes Historical Provider, MD  potassium chloride SA (K-DUR,KLOR-CON) 10 MEQ tablet Take 4 tablets (40 mEq total) by mouth daily. Patient not taking: Reported on 06/15/2015 03/26/15   Erick Blinks, MD   BP 116/67 mmHg  Pulse 88  Temp(Src) 98.4 F (36.9 C) (Oral)  Resp 18  Ht  (1.803 m)  Wt 286 lb (129.729 kg)  BMI 39.91 kg/m2  SpO2 100%  LMP 07/24/2015 Physical Exam  Constitutional: She is oriented to person,  place, and time. She appears well-developed.  HENT:  Head: Normocephalic.  Eyes: Conjunctivae and EOM are normal. No scleral icterus.  Neck: Neck supple. No thyromegaly present.  Cardiovascular: Regular rhythm.  Exam reveals no gallop and no friction rub.   No murmur heard. Rapid rate at about 180  Pulmonary/Chest: No stridor. She has no wheezes. She has no rales. She exhibits no tenderness.  Abdominal: She exhibits no distension. There is no tenderness. There is no rebound.  Musculoskeletal: Normal range of motion. She exhibits no edema.  Lymphadenopathy:    She has no cervical adenopathy.  Neurological: She is oriented to person, place, and time. She exhibits normal muscle tone. Coordination normal.  Skin: No rash noted. No erythema.  Psychiatric: She has a normal mood and affect. Her behavior is normal.    ED Course  Procedures (including critical care time) Labs Review Labs Reviewed  CBC WITH DIFFERENTIAL/PLATELET - Abnormal; Notable for the following:    WBC 10.6 (*)    Platelets 453 (*)    All other components within normal limits  COMPREHENSIVE METABOLIC PANEL - Abnormal; Notable for the following:    Glucose, Bld 151 (*)    All other components within normal limits  TROPONIN I    Imaging Review Dg Chest 2 View  08/20/2015  CLINICAL DATA:  Tachycardia. EXAM: CHEST  2 VIEW COMPARISON:  06/15/2015 FINDINGS: The heart size and mediastinal contours are within normal limits. Both lungs are clear. The visualized skeletal structures are unremarkable. IMPRESSION: Normal chest. Electronically Signed   By: Francene Boyers M.D.   On: 08/20/2015 16:43   I have personally reviewed and evaluated these images and lab results as part of my medical decision-making.   EKG Interpretation   Date/Time:  Monday August 20 2015 15:27:53 EST Ventricular Rate:  171 PR Interval:    QRS Duration: 96 QT Interval:  272 QTC Calculation: 459 R Axis:   9 Text Interpretation:  Atrial  fibrillation LVH with secondary  repolarization abnormality Confirmed by Pauletta Pickney  MD, Alexza Norbeck [phone]) on  08/20/2015 6:28:14 PM     CRITICAL CARE Performed by: Richad Ramsay L Total critical care time: 35 minutes Critical care time was exclusive of separately billable procedures and treating other patients. Critical care was necessary to treat or prevent imminent or life-threatening deterioration. Critical care was time spent personally by me on the following activities: development of treatment plan with patient and/or surrogate as well as nursing, discussions with consultants, evaluation of patient's response to treatment, examination of patient, obtaining history from patient or surrogate, ordering and performing treatments and interventions, ordering and review of laboratory studies, ordering and review of radiographic studies, pulse oximetry and re-evaluation of patient's condition.   MDM   Final diagnoses:  SVT (supraventricular tachycardia) (HCC)    Patient was given adenosine 6 mg. She did not convert she was still in the SVT. She was then given 12 mg of adenosine and converted. Patient has a cardiologist and she  plans to get an ablation done when her insurance comes in the next few months. She is sent home on her metoprolol and she is to notify her cardiologist that she needed adenosine once again      Bethann Berkshire, MD 08/20/15 [phone]

## 2015-08-31 ENCOUNTER — Encounter (HOSPITAL_COMMUNITY): Payer: Self-pay | Admitting: *Deleted

## 2015-08-31 ENCOUNTER — Emergency Department (HOSPITAL_COMMUNITY)
Admission: EM | Admit: 2015-08-31 | Discharge: 2015-08-31 | Disposition: A | Discharge status: Home / Self Care | Payer: MEDICAID | Attending: Emergency Medicine | Admitting: Emergency Medicine

## 2015-08-31 DIAGNOSIS — Z794 Long term (current) use of insulin: Secondary | ICD-10-CM | POA: Insufficient documentation

## 2015-08-31 DIAGNOSIS — Z79899 Other long term (current) drug therapy: Secondary | ICD-10-CM | POA: Insufficient documentation

## 2015-08-31 DIAGNOSIS — D649 Anemia, unspecified: Secondary | ICD-10-CM | POA: Insufficient documentation

## 2015-08-31 DIAGNOSIS — Z7984 Long term (current) use of oral hypoglycemic drugs: Secondary | ICD-10-CM | POA: Insufficient documentation

## 2015-08-31 DIAGNOSIS — I471 Supraventricular tachycardia: Secondary | ICD-10-CM | POA: Insufficient documentation

## 2015-08-31 DIAGNOSIS — E119 Type 2 diabetes mellitus without complications: Secondary | ICD-10-CM | POA: Insufficient documentation

## 2015-08-31 DIAGNOSIS — I10 Essential (primary) hypertension: Secondary | ICD-10-CM | POA: Insufficient documentation

## 2015-08-31 DIAGNOSIS — Z7982 Long term (current) use of aspirin: Secondary | ICD-10-CM | POA: Insufficient documentation

## 2015-08-31 LAB — CBC WITH DIFFERENTIAL/PLATELET
Basophils Absolute: 0 10*3/uL (ref 0.0–0.1)
Basophils Relative: 0 %
EOS ABS: 0.2 10*3/uL (ref 0.0–0.7)
Eosinophils Relative: 2 %
HEMATOCRIT: 37.6 % (ref 36.0–46.0)
HEMOGLOBIN: 12.3 g/dL (ref 12.0–15.0)
LYMPHS ABS: 3.4 10*3/uL (ref 0.7–4.0)
LYMPHS PCT: 35 %
MCH: 30 pg (ref 26.0–34.0)
MCHC: 32.7 g/dL (ref 30.0–36.0)
MCV: 91.7 fL (ref 78.0–100.0)
MONOS PCT: 6 %
Monocytes Absolute: 0.6 10*3/uL (ref 0.1–1.0)
Neutro Abs: 5.4 10*3/uL (ref 1.7–7.7)
Neutrophils Relative %: 57 %
Platelets: 448 10*3/uL — ABNORMAL HIGH (ref 150–400)
RBC: 4.1 MIL/uL (ref 3.87–5.11)
RDW: 14 % (ref 11.5–15.5)
WBC: 9.6 10*3/uL (ref 4.0–10.5)

## 2015-08-31 LAB — I-STAT CHEM 8, ED
BUN: 12 mg/dL (ref 6–20)
CALCIUM ION: 1.21 mmol/L (ref 1.12–1.23)
CHLORIDE: 103 mmol/L (ref 101–111)
CREATININE: 0.8 mg/dL (ref 0.44–1.00)
GLUCOSE: 122 mg/dL — AB (ref 65–99)
HCT: 43 % (ref 36.0–46.0)
Hemoglobin: 14.6 g/dL (ref 12.0–15.0)
POTASSIUM: 3.6 mmol/L (ref 3.5–5.1)
Sodium: 143 mmol/L (ref 135–145)
TCO2: 27 mmol/L (ref 0–100)

## 2015-08-31 MED ORDER — METOPROLOL SUCCINATE ER 25 MG PO TB24: 50.0000 mg | ORAL_TABLET | Freq: Every day | ORAL | Status: DC

## 2015-08-31 MED ORDER — ADENOSINE 6 MG/2ML IV SOLN
6.0000 mg | Freq: Once | INTRAVENOUS | Status: AC
  Administered 2015-08-31: 6 mg via INTRAVENOUS

## 2015-08-31 MED ORDER — ADENOSINE 6 MG/2ML IV SOLN
INTRAVENOUS | Status: AC
  Filled 2015-08-31: qty 2

## 2015-08-31 NOTE — ED Notes (Signed)
Pt alert & oriented x4, stable gait. Patient given discharge instructions, paperwork & prescription(s). Patient  instructed to stop at the registration desk to finish any additional paperwork. Patient verbalized understanding. Pt left department w/ no further questions. 

## 2015-08-31 NOTE — ED Notes (Signed)
Pt states that she was at work when she felt her heart racing, c/o sob as well, denies any pain,

## 2015-08-31 NOTE — ED Notes (Signed)
Pt reports onset of SVT today while at work. Pt has hx of same.

## 2015-08-31 NOTE — Discharge Instructions (Signed)
Paroxysmal Supraventricular Tachycardia Paroxysmal supraventricular tachycardia (PSVT) is a type of abnormal heart rhythm. It causes your heart to beat very quickly and then suddenly stop beating so quickly. A normal heart rate is 60-100 beats per minute. During an episode of PSVT, your heart rate may be 150-250 beats per minute. This can make you feel light-headed and short of breath. An episode of PSVT can be frightening. It is usually not dangerous. The heart has four chambers. All chambers need to work together for the heart to beat effectively. A normal heartbeat usually starts in the right upper chamber of the heart (atrium) when an area (sinoatrial node) puts out an electrical signal that spreads to the other chambers. People with PSVT may have abnormal electrical pathways, or they may have other areas in the upper chambers that send out electrical signals. The result is a very rapid heartbeat. When your heart beats very quickly, it does not have time to fill completely with blood. When PSVT happens often or it lasts for long periods, it can lead to heart weakness and failure. Most people with PSVT do not have any other heart disease. CAUSES Abnormal electrical activity in the heart causes PSVT. It is not known why some people get PSVT and others do not. RISK FACTORS You may be more likely to have PSVT if:  You are 20-30 years old.  You are a woman. Other factors that may increase your chances of an attack include:  Stress.  Being tired.  Smoking.  Stimulant drugs.  Alcoholic drinks.  Caffeine.  Pregnancy. SIGNS AND SYMPTOMS A mild episode of PSVT may cause no symptoms. If you do have signs and symptoms, they may include:  A pounding heart.  Feeling of skipped heartbeats (palpitations).  Weakness.  Shortness of breath.  Tightness or pain in your chest.  Light-headedness.  Anxiety.  Dizziness.  Sweating.  Nausea.  A fainting spell. DIAGNOSIS Your health care  provider may suspect PSVT if you have symptoms that come and go. The health care provider will do a physical exam. If you are having an episode during the exam, the health care provider may be able to diagnose PSVT by listening to your heart and feeling your pulse. Tests may also be done, including:  An electrical study of your heart (electrocardiogram, or ECG).  A test in which you wear a portable ECG monitor all day (Holter monitor) or for several days (event monitor).  A test that involves taking an image of your heart using sound waves (echocardiogram) to rule out other causes of a fast heart rate. TREATMENT You may not need treatment if episodes of PSVT do not happen often or if they do not cause symptoms. If PSVT episodes do cause symptoms, your health care provider may first suggest trying a self-treatment called vagus nerve stimulation. The vagus nerve extends down from the brain. It regulates certain body functions. Stimulating this nerve can slow down the heart. Your health care provider can teach you ways to do this. You may need to try a few ways to find what works best for you. Options include:  Holding your breath and pushing, as though you are having a bowel movement.  Massaging an area on one side of your neck below your jaw.  Bending forward with your head between your legs.  Bending forward with your head between your legs and coughing.  Massaging your eyeballs with your eyes closed. If vagus nerve stimulation does not work, other treatment options include:    Medicines to prevent an attack.  Being treated in the hospital with medicine or electric shock to stop an attack (cardioversion). This treatment can include:  Getting medicine through an IV line.  Having a small electric shock delivered to your heart. You will be given medicine to make you sleep through this procedure.  If you have frequent episodes with symptoms, you may need a procedure to get rid of the faulty  areas of your heart (radiofrequency ablation) and end the episodes of PSVT. In this procedure:  A long, thin tube (catheter) is passed through one of your veins into your heart.  Energy directed through the catheter eliminates the areas of your heart that are causing abnormal electric stimulation. HOME CARE INSTRUCTIONS  Take medicines only as directed by your health care provider.  Do not use caffeine in any form if caffeine triggers episodes of PSVT. Otherwise, consume caffeine in moderation. This means no more than a few cups of coffee or the equivalent each day.  Do not drink alcohol if alcohol triggers episodes of PSVT. Otherwise, limit alcohol intake to no more than 1 drink per day for nonpregnant women and 2 drinks per day for men. One drink equals 12 ounces of beer, 5 ounces of wine, or 1 ounces of hard liquor.  Do not use any tobacco products, including cigarettes, chewing tobacco, or electronic cigarettes. If you need help quitting, ask your health care provider.  Try to get at least 7 hours of sleep each night.  Find healthy ways to manage stress.  Perform vagus nerve stimulation as directed by your health care provider.  Maintain a healthy weight.  Get some exercise on most days. Ask your health care provider to suggest some good activities for you. SEEK MEDICAL CARE IF:  You are having episodes of PSVT more often, or they are lasting longer.  Vagus nerve stimulation is no longer helping.  You have new symptoms during an episode. SEEK IMMEDIATE MEDICAL CARE IF:  You have chest pain or trouble breathing.  You have an episode of PSVT that has lasted longer than 20 minutes.  You have passed out from an episode of PSVT. These symptoms may represent a serious problem that is an emergency. Do not wait to see if the symptoms will go away. Get medical help right away. Call your local emergency services (911 in the [place] Do not drive yourself to the hospital.   This  information is not intended to replace advice given to you by your health care provider. Make sure you discuss any questions you have with your health care provider.   Document Released: 06/30/2005 Document Revised: 07/21/2014 Document Reviewed: 12/08/2013 [employer] Interactive Patient Education 2016 [employer]  

## 2015-08-31 NOTE — ED Provider Notes (Signed)
CSN: [id number]     Arrival date & time 08/31/15  1912 History   First MD Initiated Contact with Patient 08/31/15 1939     Chief Complaint  Patient presents with  . Tachycardia     The history is provided by the patient.   patient is a history of paroxysmal SVT. She was planned for an ablation, however she did not have insurance that time. She states her insurance will start beginning of March. States she will get back in with her electrophysiologist for that time. She is on 25 mg metoprolol. States she took an extra short-acting dose today after she began to feel her heart go fast. Mild shortness of breath with it. This is like her previous SVT episodes. No fevers or chills. No cough. No swelling or legs. No recent medication changes.  Past Medical History  Diagnosis Date  . Diabetes mellitus without complication (HCC)   . Hypertension   . Anemia   . SVT (supraventricular tachycardia) (HCC)    History reviewed. No pertinent past surgical history. Family History  Problem Relation Age of Onset  . Diabetes Mother   . Heart failure Mother   . Diabetes Other    Social History  Substance Use Topics  . Smoking status: Never Smoker   . Smokeless tobacco: Never Used  . Alcohol Use: No   OB History    Gravida Para Term Preterm AB TAB SAB Ectopic Multiple Living   0     Review of Systems  Constitutional: Negative for appetite change.  Respiratory: Positive for shortness of breath. Negative for cough.   Cardiovascular: Positive for palpitations. Negative for chest pain and leg swelling.  Gastrointestinal: Negative for abdominal pain.  Endocrine: Negative for polyphagia and polyuria.  Genitourinary: Negative for dysuria.  Musculoskeletal: Negative for back pain.  Skin: Negative for wound.  Neurological: Negative for syncope.      Allergies  Lisinopril  Home Medications   Prior to Admission medications   Medication Sig Start Date End Date Taking? Authorizing  Provider  aspirin EC 81 MG tablet Take 81 mg by mouth daily as needed for mild pain (/palpitations).    Historical Provider, MD  ferrous sulfate 325 (65 FE) MG tablet Take 325 mg by mouth daily with breakfast.    Historical Provider, MD  insulin aspart (NOVOLOG) 100 UNIT/ML injection Inject 5 Units into the skin 3 (three) times daily before meals. Per sliding scale instructions    Historical Provider, MD  insulin glargine (LANTUS) 100 UNIT/ML injection Inject 0.1 mLs (10 Units total) into the skin at bedtime. Patient taking differently: Inject 10-50 Units into the skin 2 (two) times daily. Takes 50 units in the morning and 10 units at bedtime. 03/26/15   Erick Blinks, MD  losartan (COZAAR) 50 MG tablet Take 50 mg by mouth daily.    Historical Provider, MD  metFORMIN (GLUCOPHAGE) 1000 MG tablet Take 1,000 mg by mouth 2 (two) times daily with a meal.    Historical Provider, MD  metoprolol succinate (TOPROL-XL) 25 MG 24 hr tablet Take 1.5 tablets (37.5 mg total) by mouth daily. Patient taking differently: Take 25 mg by mouth daily.  06/15/15   Raeford Razor, MD  metoprolol tartrate (LOPRESSOR) 25 MG tablet TAKE ONE TABLET BY MOUTH ONCE DAILY AS NEEDED FOR  HEART  PALPITATIONS 08/21/15   [name], MD  Multiple Vitamin (MULTIVITAMIN WITH MINERALS) TABS tablet Take 1 tablet by mouth daily.  Historical Provider, MD  potassium chloride SA (K-DUR,KLOR-CON) 10 MEQ tablet Take 4 tablets (40 mEq total) by mouth daily. Patient not taking: Reported on 06/15/2015 03/26/15   Erick Blinks, MD   BP 133/81 mmHg  Pulse 86  Temp(Src) 98.2 F (36.8 C) (Oral)  Resp 20  Ht  (1.803 m)  Wt 281 lb (127.461 kg)  BMI 39.21 kg/m2  SpO2 100%  LMP 07/24/2015 Physical Exam  Constitutional: She appears well-developed.  Patient morbidly obese.  Eyes: EOM are normal.  Neck: Neck supple.  Cardiovascular:  Tachycardia  Pulmonary/Chest: Effort normal.  Abdominal: Soft. There is no tenderness.  Musculoskeletal:  Normal range of motion.  Neurological: She is alert.  Skin: Skin is warm.    ED Course  Procedures (including critical care time) Labs Review Labs Reviewed  CBC WITH DIFFERENTIAL/PLATELET - Abnormal; Notable for the following:    Platelets 448 (*)    All other components within normal limits  I-STAT CHEM 8, ED - Abnormal; Notable for the following:    Glucose, Bld 122 (*)    All other components within normal limits    Imaging Review No results found. I have personally reviewed and evaluated these images and lab results as part of my medical decision-making.   EKG Interpretation   Date/Time:  Friday August 31 2015 19:20:20 EST Ventricular Rate:  144 PR Interval:    QRS Duration: 98 QT Interval:  286 QTC Calculation: 442 R Axis:   10 Text Interpretation:  Supraventricular tachycardia Nonspecific ST and T  wave abnormality Abnormal ECG Confirmed by Rubin Payor  MD, Harrold Donath [phone])  on 08/31/2015 7:40:05 PM      MDM   Final diagnoses:  SVT (supraventricular tachycardia) (HCC)    Patient with SVT. History of same. Converted with 6 mg of adenosine. Will increase patient's metoprolol. She'll follow with her electrophysiologist. Has history of hypokalemia however potassium is normal at this time.     Benjiman Core, MD 08/31/15 2107

## 2015-09-09 ENCOUNTER — Emergency Department (HOSPITAL_COMMUNITY)
Admission: EM | Admit: 2015-09-09 | Discharge: 2015-09-10 | Disposition: A | Discharge status: Home / Self Care | Payer: MEDICAID | Attending: Emergency Medicine | Admitting: Emergency Medicine

## 2015-09-09 ENCOUNTER — Encounter (HOSPITAL_COMMUNITY): Payer: Self-pay | Admitting: Emergency Medicine

## 2015-09-09 DIAGNOSIS — Z79899 Other long term (current) drug therapy: Secondary | ICD-10-CM | POA: Insufficient documentation

## 2015-09-09 DIAGNOSIS — I471 Supraventricular tachycardia: Secondary | ICD-10-CM | POA: Insufficient documentation

## 2015-09-09 DIAGNOSIS — I10 Essential (primary) hypertension: Secondary | ICD-10-CM | POA: Insufficient documentation

## 2015-09-09 DIAGNOSIS — D649 Anemia, unspecified: Secondary | ICD-10-CM | POA: Insufficient documentation

## 2015-09-09 DIAGNOSIS — E119 Type 2 diabetes mellitus without complications: Secondary | ICD-10-CM | POA: Insufficient documentation

## 2015-09-09 DIAGNOSIS — Z794 Long term (current) use of insulin: Secondary | ICD-10-CM | POA: Insufficient documentation

## 2015-09-09 DIAGNOSIS — Z7984 Long term (current) use of oral hypoglycemic drugs: Secondary | ICD-10-CM | POA: Insufficient documentation

## 2015-09-09 LAB — BASIC METABOLIC PANEL
Anion gap: 9 (ref 5–15)
BUN: 15 mg/dL (ref 6–20)
CHLORIDE: 104 mmol/L (ref 101–111)
CO2: 27 mmol/L (ref 22–32)
CREATININE: 0.91 mg/dL (ref 0.44–1.00)
Calcium: 8.9 mg/dL (ref 8.9–10.3)
GFR calc non Af Amer: >60 mL/min (ref >60)
Glucose, Bld: 106 mg/dL — ABNORMAL HIGH (ref 65–99)
POTASSIUM: 3.6 mmol/L (ref 3.5–5.1)
SODIUM: 140 mmol/L (ref 135–145)

## 2015-09-09 LAB — CBC WITH DIFFERENTIAL/PLATELET
BASOS ABS: 0 10*3/uL (ref 0.0–0.1)
BASOS PCT: 0 %
Eosinophils Absolute: 0.2 10*3/uL (ref 0.0–0.7)
Eosinophils Relative: 3 %
HEMATOCRIT: 40.1 % (ref 36.0–46.0)
HEMOGLOBIN: 12.9 g/dL (ref 12.0–15.0)
Lymphocytes Relative: 32 %
Lymphs Abs: 2.7 10*3/uL (ref 0.7–4.0)
MCH: 29.7 pg (ref 26.0–34.0)
MCHC: 32.2 g/dL (ref 30.0–36.0)
MCV: 92.2 fL (ref 78.0–100.0)
Monocytes Absolute: 0.3 10*3/uL (ref 0.1–1.0)
Monocytes Relative: 3 %
NEUTROS ABS: 5.2 10*3/uL (ref 1.7–7.7)
NEUTROS PCT: 62 %
Platelets: 433 10*3/uL — ABNORMAL HIGH (ref 150–400)
RBC: 4.35 MIL/uL (ref 3.87–5.11)
RDW: 14.2 % (ref 11.5–15.5)
WBC: 8.5 10*3/uL (ref 4.0–10.5)

## 2015-09-09 LAB — TROPONIN I: Troponin I: <0.03 ng/mL (ref <0.031)

## 2015-09-09 LAB — MAGNESIUM: MAGNESIUM: 1.8 mg/dL (ref 1.7–2.4)

## 2015-09-09 MED ORDER — ADENOSINE 6 MG/2ML IV SOLN
6.0000 mg | Freq: Once | INTRAVENOUS | Status: AC
  Administered 2015-09-09: 6 mg via INTRAVENOUS
  Filled 2015-09-09: qty 2

## 2015-09-09 MED ORDER — LORAZEPAM 1 MG PO TABS
1.0000 mg | ORAL_TABLET | Freq: Once | ORAL | Status: AC
  Administered 2015-09-09: 1 mg via ORAL
  Filled 2015-09-09: qty 1

## 2015-09-09 MED ORDER — SODIUM CHLORIDE 0.9 % IV SOLN
INTRAVENOUS | Status: DC
  Administered 2015-09-09: 22:00:00 via INTRAVENOUS

## 2015-09-09 NOTE — ED Notes (Signed)
Pt states that she has frequent bouts of SVT and that she takes metoprolol for it. States that her MD recently had her "double-up" on the metoprolol as a result of this. Pt states she has had both doses of Metoprolol today.

## 2015-09-09 NOTE — ED Provider Notes (Signed)
CSN: [id number]     Arrival date & time 09/09/15  2135 History   First MD Initiated Contact with Patient 09/09/15 2157     Chief Complaint  Patient presents with  . Tachycardia      HPI  Pt was seen at 2155. Per pt, c/o sudden onset and persistence of constant palpitations that began approximately 2000 PTA. Pt describes her symptoms as "my SVT."  Pt tried her usual vagal maneuvers at home without improvement. Pt states she takes metoprolol BID for her symptoms (this was increased last week after her 08/31/15 ED visit for same). Denies CP, no abd pain, no N/V/D, no back pain, no fevers. The symptoms have been associated with no other complaints. The patient has a significant history of similar symptoms previously, recently being evaluated for this complaint and multiple prior evals for same. This is pt's 3rd ED visit this month for this complaint.     Cards: Dr. Johney Frame Past Medical History  Diagnosis Date  . Diabetes mellitus without complication (HCC)   . Hypertension   . Anemia   . SVT (supraventricular tachycardia) (HCC)    History reviewed. No pertinent past surgical history.   Family History  Problem Relation Age of Onset  . Diabetes Mother   . Heart failure Mother   . Diabetes Other    Social History  Substance Use Topics  . Smoking status: Never Smoker   . Smokeless tobacco: Never Used  . Alcohol Use: No   OB History    Gravida Para Term Preterm AB TAB SAB Ectopic Multiple Living   0     Review of Systems ROS: Statement: All systems negative except as marked or noted in the HPI; Constitutional: Negative for fever and chills. ; ; Eyes: Negative for eye pain, redness and discharge. ; ; ENMT: Negative for ear pain, hoarseness, nasal congestion, sinus pressure and sore throat. ; ; Cardiovascular: +palpitations. Negative for chest pain, diaphoresis, dyspnea and peripheral edema. ; ; Respiratory: Negative for cough, wheezing and stridor. ; ; Gastrointestinal:  Negative for nausea, vomiting, diarrhea, abdominal pain, blood in stool, hematemesis, jaundice and rectal bleeding. . ; ; Genitourinary: Negative for dysuria, flank pain and hematuria. ; ; Musculoskeletal: Negative for back pain and neck pain. Negative for swelling and trauma.; ; Skin: Negative for pruritus, rash, abrasions, blisters, bruising and skin lesion.; ; Neuro: Negative for headache, lightheadedness and neck stiffness. Negative for weakness, altered level of consciousness , altered mental status, extremity weakness, paresthesias, involuntary movement, seizure and syncope.      Allergies  Lisinopril  Home Medications   Prior to Admission medications   Medication Sig Start Date End Date Taking? Authorizing Provider  insulin glargine (LANTUS) 100 UNIT/ML injection Inject 0.1 mLs (10 Units total) into the skin at bedtime. Patient taking differently: Inject 10-50 Units into the skin 2 (two) times daily. Takes 50 units in the morning and 10 units at bedtime. 03/26/15  Yes Erick Blinks, MD  metoprolol succinate (TOPROL-XL) 25 MG 24 hr tablet Take 2 tablets (50 mg total) by mouth daily. 08/31/15  Yes Benjiman Core, MD  aspirin EC 81 MG tablet Take 81 mg by mouth daily as needed for mild pain (/palpitations).    Historical Provider, MD  ferrous sulfate 325 (65 FE) MG tablet Take 325 mg by mouth daily with breakfast.    Historical Provider, MD  insulin aspart (NOVOLOG) 100 UNIT/ML injection Inject 5 Units into the skin  3 (three) times daily before meals. Per sliding scale instructions    Historical Provider, MD  losartan (COZAAR) 50 MG tablet Take 50 mg by mouth daily.    Historical Provider, MD  metFORMIN (GLUCOPHAGE) 1000 MG tablet Take 1,000 mg by mouth 2 (two) times daily with a meal.    Historical Provider, MD  metoprolol tartrate (LOPRESSOR) 25 MG tablet TAKE ONE TABLET BY MOUTH ONCE DAILY AS NEEDED FOR  HEART  PALPITATIONS 08/21/15   [name], MD  Multiple Vitamin (MULTIVITAMIN WITH  MINERALS) TABS tablet Take 1 tablet by mouth daily.    Historical Provider, MD  potassium chloride SA (K-DUR,KLOR-CON) 10 MEQ tablet Take 4 tablets (40 mEq total) by mouth daily. Patient not taking: Reported on 06/15/2015 03/26/15   Erick Blinks, MD   BP 158/101 mmHg  Pulse 155  Temp(Src) 97.7 F (36.5 C) (Oral)  Resp 22  Ht  (1.803 m)  Wt 281 lb (127.461 kg)  BMI 39.21 kg/m2  SpO2 100%  LMP 08/20/2015   Patient Vitals for the past 24 hrs:  BP Temp Temp src Pulse Resp SpO2 Height Weight  09/09/15 2230 152/88 mmHg - - 96 12 98 % - -  09/09/15 2200 164/99 mmHg - - (!) 162 17 100 % - -  09/09/15 2147 (!) 158/101 mmHg 97.7 F (36.5 C) Oral (!) 155 22 100 %  (1.803 m) 281 lb (127.461 kg)      Physical Exam  2200: Physical examination:  Nursing notes reviewed; Vital signs and O2 SAT reviewed;  Constitutional: Well developed, Well nourished, Well hydrated, In no acute distress; Head:  Normocephalic, atraumatic; Eyes: EOMI, PERRL, No scleral icterus; ENMT: Mouth and pharynx normal, Mucous membranes moist; Neck: Supple, Full range of motion, No lymphadenopathy; Cardiovascular: Tachycardic rate and rhythm, No gallop; Respiratory: Breath sounds clear & equal bilaterally, No wheezes.  Speaking full sentences with ease, Normal respiratory effort/excursion; Chest: Nontender, Movement normal; Abdomen: Soft, Nontender, Nondistended, Normal bowel sounds; Genitourinary: No CVA tenderness; Extremities: Pulses normal, No tenderness, No edema, No calf edema or asymmetry.; Neuro: AA&Ox3, Major CN grossly intact.  Speech clear. No gross focal motor or sensory deficits in extremities.; Skin: Color normal, Warm, Dry.   ED Course  Procedures (including critical care time) Labs Review  Imaging Review  I have personally reviewed and evaluated these images and lab results as part of my medical decision-making.   EKG Interpretation   Date/Time:  Sunday September 09 2015 21:45:52 EST  On  arrival Ventricular Rate:  157 PR Interval:  111 QRS Duration: 97 QT Interval:  296 QTC Calculation: 478 R Axis:   4 Text Interpretation:  Supraventricular tachycardia Abnormal R-wave  progression, early transition LVH with secondary repolarization  abnormality When compared with ECG of 08/31/2015 No significant change was  found Confirmed by [facility]  MD, Nicholos Johns [phone]) on 09/09/2015 10:04:04 PM      EKG Interpretation  Date/Time:  Sunday September 09 2015 22:10:27 EST  Repeat after IV adenosine Ventricular Rate:  96 PR Interval:  176 QRS Duration: 80 QT Interval:  358 QTC Calculation: 452 R Axis:   1 Text Interpretation:  Sinus rhythm LVH with secondary repolarization abnormality anterior  Leads misplaced When compared with ECG of 08/20/2015 anterior  Leads misplaced Otherwise no significant change Confirmed by [facility]  MD, Nicholos Johns [phone]) on 09/09/2015 10:16:20 PM        MDM  MDM Reviewed: previous chart, vitals and nursing note Reviewed previous: labs and ECG Interpretation: labs  and ECG   Results for orders placed or performed during the hospital encounter of 09/09/15  CBC with Differential  Result Value Ref Range   WBC 8.5 4.0 - 10.5 K/uL   RBC 4.35 3.87 - 5.11 MIL/uL   Hemoglobin 12.9 12.0 - 15.0 g/dL   HCT 81.1 91.4 - 78.2 %   MCV 92.2 78.0 - 100.0 fL   MCH 29.7 26.0 - 34.0 pg   MCHC 32.2 30.0 - 36.0 g/dL   RDW 95.6 21.3 - 08.6 %   Platelets 433 (H) 150 - 400 K/uL   Neutrophils Relative % 62 %   Neutro Abs 5.2 1.7 - 7.7 K/uL   Lymphocytes Relative 32 %   Lymphs Abs 2.7 0.7 - 4.0 K/uL   Monocytes Relative 3 %   Monocytes Absolute 0.3 0.1 - 1.0 K/uL   Eosinophils Relative 3 %   Eosinophils Absolute 0.2 0.0 - 0.7 K/uL   Basophils Relative 0 %   Basophils Absolute 0.0 0.0 - 0.1 K/uL  Troponin I  Result Value Ref Range   Troponin I <0.03 <0.031 ng/mL  Basic metabolic panel  Result Value Ref Range   Sodium 140 135 - 145 mmol/L   Potassium 3.6 3.5 - 5.1  mmol/L   Chloride 104 101 - 111 mmol/L   CO2 27 22 - 32 mmol/L   Glucose, Bld 106 (H) 65 - 99 mg/dL   BUN 15 6 - 20 mg/dL   Creatinine, Ser 5.78 0.44 - 1.00 mg/dL   Calcium 8.9 8.9 - 46.9 mg/dL   GFR calc non Af Amer >60 >60 mL/min   GFR calc Af Amer >60 >60 mL/min   Anion gap 9 5 - 15  Magnesium  Result Value Ref Range   Magnesium 1.8 1.7 - 2.4 mg/dL     6295:  Pt has received IV adenosine multiple times previously; aware of risks/benefits of same and agreeable to proceed. IV adenosine 6mg  given with good effect. Pt states she feels "much better now." Monitor NSR, rate 80's. Labs in progress.   2320:  Labs reassuring. Pt requested ativan "for anxiety." VS remain stable, HR 80-90's, NSR on monitor.  T/C to [facility] Dr. Donnie Aho, case discussed, including:  HPI, pertinent PM/SHx, VS/PE, dx testing, ED course and treatment:  Agrees with ED treatment, requests to have pt increase her Toprol XL to 50mg  PO BID and strongly encourage pt to call Cards office in the morning to f/u this week (pt has not been seen in the office since 03/2015 and ablation was recommended at that time). Dx and testing, as well as d/w Cards MD, d/w pt and family.  Questions answered.  Verb understanding, agreeable to d/c home with outpt f/u with her Cards MD this week.   Samuel Jester, DO 09/12/15 2100

## 2015-09-09 NOTE — ED Notes (Signed)
Pt c/o feeling anxious, informed Dr. Richrd Prime

## 2015-09-09 NOTE — ED Notes (Addendum)
Pt stated took 2 baby aspirin 

## 2015-09-09 NOTE — Discharge Instructions (Signed)
°Emergency Department Resource Guide °1) Find a Doctor and Pay Out of Pocket °Although you won't have to find out who is covered by your insurance plan, it is a good idea to ask around and get recommendations. You will then need to call the office and see if the doctor you have chosen will accept you as a new patient and what types of options they offer for patients who are self-pay. Some doctors offer discounts or will set up payment plans for their patients who do not have insurance, but you will need to ask so you aren't surprised when you get to your appointment. ° °2) Contact Your Local Health Department °Not all health departments have doctors that can see patients for sick visits, but many do, so it is worth a call to see if yours does. If you don't know where your local health department is, you can check in your phone book. The CDC also has a tool to help you locate your state's health department, and many state websites also have listings of all of their local health departments. ° °3) Find a Walk-in Clinic °If your illness is not likely to be very severe or complicated, you may want to try a walk in clinic. These are popping up all over the country in pharmacies, drugstores, and shopping centers. They're usually staffed by nurse practitioners or physician assistants that have been trained to treat common illnesses and complaints. They're usually fairly quick and inexpensive. However, if you have serious medical issues or chronic medical problems, these are probably not your best option. ° °No Primary Care Doctor: °- Call [facility] at  832-8000 - they can help you locate a primary care doctor that  accepts your insurance, provides certain services, etc. °- Physician Referral Service- 1-800-533-3463 ° °Chronic Pain Problems: °Organization         Address  Phone   Notes  °[facility]  (336) 297-2271 Patients need to be referred by their primary care doctor.  ° °Medication  Assistance: °Organization         Address  Phone   Notes  °[place] [facility] [address] °[place], [place] [address] (336) 641-8030 --Must be a resident of [place] °-- Must have NO insurance coverage whatsoever (no Medicaid/ Medicare, etc.) °-- The pt. MUST have a primary care doctor that directs their care regularly and follows them in the community °  °[facility]  (866) 331-1348   °[employer]  (888) 892-1162   ° °Agencies that provide inexpensive medical care: °Organization         Address  Phone   Notes  °[facility]  (336) 832-8035   °[facility]    (336) 832-7272   °[facility] [address] °[place], [place] [address] (336) 832-4777   °[facility] [address], °[place] (336) 271-4999   °[facility]    (336) 373-0678   °[facility]    (336) 272-1050   °[facility] ° [address], [place] Phone:  (336) 832-4444, Fax:  (336) 832-4440 Hours of Operation:  9 am - 6 pm, M-F.  Also accepts Medicaid/Medicare and self-pay.  °[facility] ° [address], [place] Phone: (336) 832-3150, Fax: (336) 832-3151. Hours of Operation:  8:30 am - 5:30 pm, M-F.  Also accepts Medicaid and self-pay.  °[facility] [place] [address], [place] Phone: (336) 878-6027   °[facility] [address], [place], [place] (336)723-1848, Ext. 123 Mondays & Thursdays: 7-9 AM.  First 15 patients are seen on a first come, first serve basis. °  ° °Medicaid-accepting [place] Providers: ° °Organization         Address  Phone   Notes  °[facility] [address], [place] (336) 641-2100 Also accepts self-pay patients.  °[facility] [address], [place] ° (336) 856-9996   °[facility] [address], [place]  (336) 288-8857   °[facility] [address] [place] [address], [place] (336) 299-7000   °Veita Bland [address], [place]  ° (336) 373-1557 Only accepts [place] Access Medicaid patients after they have their name applied to their card.  ° °Self-Pay (no insurance) in [place]: ° °Organization         Address  Phone   Notes  °Sickle Cell Patients, [facility] [address], [place] (336) 832-1970   °[facility] [address], [place] (336) 832-4400   °[facility] ° [address], [place] (336) 992-4800   °[facility]/Dr. Osei-Bonsu ° [address] [place] [address], [place] or [address], [place] (336) 841-8500 Phone number for both [place] and [place] locations is the same.  °[facility] [address], [place] (336) 299-0000   °[facility] [address] [place] [address], [place] or [address] (336) 852-7530 °(336) 878-2260   °[facility] [address], [place] (336) 350-1642, phone; (336) 294-5005, fax Sees patients 1st and 3rd Saturday of every month.  Must not qualify for public or private insurance (i.e. Medicaid, Medicare, [place] Health Choice, Veterans' Benefits) • Household income should be no more than 200% of the poverty level •The clinic cannot treat you if you are pregnant or think you are pregnant • Sexually transmitted diseases are not treated at the clinic.  ° ° °Dental Care: °Organization         Address  Phone  Notes  °[place] [facility] [address], [place] (336) 641-6152 Accepts children up to age 21 who are enrolled in Medicaid or [place] Health Choice; pregnant women with a Medicaid card; and children who have applied for Medicaid or [place] Health Choice, but were declined, whose parents can pay a reduced fee at time of service.  °[place]  [facility] [place]  [address], [place] (336) 641-7733 Accepts children up to age 21 who are enrolled in Medicaid or [place] Health Choice; pregnant women with a Medicaid card; and children who have applied for Medicaid or [place] Health Choice, but were declined, whose parents can pay a reduced fee at time of service.  °[facility] ° [address], [place] (336) 641-4533 Patients are seen by appointment only. Walk-ins are not accepted. [facility] will see patients 18 years of age and older. °Monday - Tuesday (8am-5pm) °Most Wednesdays (8:30-5pm) °$30 per visit, cash only  °[facility] ° [address], [place] (336) 641-4533 Patients are seen by appointment only. Walk-ins are not accepted. [facility] will see patients 18 years of age and older. °One   Wednesday Evening (Monthly: Volunteer Based).  $30 per visit, cash only  °[facility]  (919) 537-3737 for adults; Children under age 4, call Graduate Pediatric Dentistry at (919) [phone]. Children aged 4-14, please call (919) [phone] to request a pediatric application. ° Dental services are provided in all areas of dental care including fillings, crowns and bridges, complete and partial dentures, implants, gum treatment, root canals, and extractions. Preventive care is also provided. Treatment is provided to both adults and children. °Patients are selected via a lottery and there is often a waiting list. °  °[facility] [address], °[place] ° (336) 763-8833 [email or web address] °  °[facility] [address], [place], [place] (336)723-1848, Ext. 123 Second and Fourth Thursday of each month, opens at 6:30 AM; Clinic ends at 9 AM.  Patients are seen on a first-come first-served basis, and a limited number are seen during each clinic.  ° °[facility] ° [address], [place], [place] (336) 723-7904    Eligibility Requirements °You must have lived in [place], [place], or [place] counties for at least the last three months. °  You cannot be eligible for state or federal sponsored healthcare insurance, including Veterans Administration, Medicaid, or Medicare. °  You generally cannot be eligible for healthcare insurance through your employer.  °  How to apply: °Eligibility screenings are held every Tuesday and Wednesday afternoon from 1:00 pm until 4:00 pm. You do not need an appointment for the interview!  °[facility] [address], [place], [place] 336-631-2330   °[facility]  336-342-8273   °[facility]  336-703-3100   °[facility]  336-570-6415   ° °Behavioral Health Resources in the Community: °Intensive Outpatient Programs °Organization         Address  Phone  Notes  °[facility] [address], [place], [place] 336-878-6098   °[facility] [address], [place], [place] 336-832-9800   °[facility] [address], [place], [place] ° 336-882-2125   °[facility] [address],  °[place], [place] 1-800-853-5163 or 336-641-4981   °Substance Abuse Resources °Organization         Address  Phone  Notes  °[facility]  336-882-2125   °[facility]  336-784-9470   °[facility]  336-285-9073   °[facility]  336-845-3988   °Residential & Outpatient Substance Abuse Program  1-800-659-3381   °Psychological Services °Organization         Address  Phone  Notes  °[facility]  336- 832-9600   °[facility]  336- 378-7881   °[facility] [address], [place] 1-800-853-5163 or 336-641-4981   ° °Mobile Crisis Teams °Organization         Address  Phone  Notes  °[facility], Mobile Crisis Care Unit  1-877-626-1772   °Assertive °[facility] ° [address]  [place], [place] 336-834-9664   °Sharon DeEsch [address] °[place] 336-554-5454   ° °Self-Help/Support Groups °Organization         Address  Phone             Notes  °[facility]  - variety of support groups  336- 373-1402 Call for more information  °Narcotics Anonymous (NA), [facility] [address], °[place]  2 meetings at this location  ° °  Residential Treatment Programs Organization         Address  Phone  Notes  [facility] [address],    [place]  1-610-960-4540   [facility]  [address] [place] [address], [place], [place] 478-295-6213   [facility] [address] [place], [place] 086-578-4696 Admissions: 8am-3pm M-F  [facility] [address],    [place], [place] 295-284-1324   [facility] [address] [place], [place], [place] 401-027-2536   [facility] [address]  [place], [place] 644-034-7425   [facility] Intensive Outpatient [address], [place], [place] 956-387-5643   [facility] ([facility] [address] [place]  [place], [place] 3-295-188-4166 or 253-116-6641   [facility] ([facility] [address], [place], [place] 323-557-3220 Accepts Medicaid  [facility] [address]  [place] 2-542-706-2376 Substance Abuse/Addiction Treatment   [facility]         Address  Phone  Notes  [facility]  430-035-1054   Angie Fava, PhD [address] [place], [place]   414-307-3692 or 4016115496   [facility]   [address] [place], [place] 850-339-0329   [facility] [address] [place], [place] 825-581-0186 Insurance/Medicaid/sponsorship through [facility] [address]                                    [place], [place] [phone] Therapy/tele-psych/case    [facility] [address], [place] [phone]    Dr. Lolly Mustache  [phone]   [facility]  [employer] [facility] 1) [address] [place] 2) [address] [place] 3)  [address], [place] (641)048-9023 608-029-6001  706-511-1172   [facility] Child Abuse Hotline 832-474-7511 or 628-064-9092 (After Hours)      Take your Toprol XL (  tabs): 2 tablets ( ) by mouth twice a day. Avoid avoid caffinated products, such as teas, colas, coffee, chocolate. Avoid over the counter cold medicines, herbal or "natural vitamin" products, and illicit drugs because they can contain stimulants. Call your regular Cardiologist tomorrow morning to schedule a follow up appointment within the next 2 days.  Return to the Emergency Department immediately sooner if worsening.

## 2015-09-10 NOTE — ED Notes (Signed)
Pt states understanding of care given and follow up instructions.  Pt states that her insurance should begin March 1, then she will be able get ablation

## 2015-09-12 ENCOUNTER — Other Ambulatory Visit: Payer: Self-pay | Admitting: Internal Medicine

## 2015-09-20 ENCOUNTER — Encounter (HOSPITAL_COMMUNITY): Payer: Self-pay | Admitting: Emergency Medicine

## 2015-09-20 ENCOUNTER — Emergency Department (HOSPITAL_COMMUNITY)
Admission: EM | Admit: 2015-09-20 | Discharge: 2015-09-20 | Disposition: A | Discharge status: Home / Self Care | Payer: 59 | Attending: Emergency Medicine | Admitting: Emergency Medicine

## 2015-09-20 DIAGNOSIS — Z7984 Long term (current) use of oral hypoglycemic drugs: Secondary | ICD-10-CM | POA: Diagnosis not present

## 2015-09-20 DIAGNOSIS — I10 Essential (primary) hypertension: Secondary | ICD-10-CM | POA: Diagnosis not present

## 2015-09-20 DIAGNOSIS — Z794 Long term (current) use of insulin: Secondary | ICD-10-CM | POA: Diagnosis not present

## 2015-09-20 DIAGNOSIS — I471 Supraventricular tachycardia: Secondary | ICD-10-CM | POA: Diagnosis not present

## 2015-09-20 DIAGNOSIS — R002 Palpitations: Secondary | ICD-10-CM | POA: Diagnosis present

## 2015-09-20 DIAGNOSIS — Z79899 Other long term (current) drug therapy: Secondary | ICD-10-CM | POA: Diagnosis not present

## 2015-09-20 DIAGNOSIS — E119 Type 2 diabetes mellitus without complications: Secondary | ICD-10-CM | POA: Diagnosis not present

## 2015-09-20 DIAGNOSIS — Z7982 Long term (current) use of aspirin: Secondary | ICD-10-CM | POA: Diagnosis not present

## 2015-09-20 LAB — CBG MONITORING, ED: GLUCOSE-CAPILLARY: 109 mg/dL — AB (ref 65–99)

## 2015-09-20 MED ORDER — ADENOSINE 6 MG/2ML IV SOLN
6.0000 mg | Freq: Once | INTRAVENOUS | Status: AC
  Administered 2015-09-20: 6 mg via INTRAVENOUS

## 2015-09-20 MED ORDER — ADENOSINE 6 MG/2ML IV SOLN
INTRAVENOUS | Status: AC
  Administered 2015-09-20: 6 mg via INTRAVENOUS
  Filled 2015-09-20: qty 2

## 2015-09-20 MED ORDER — METOPROLOL SUCCINATE ER 50 MG PO TB24: 25.0000 mg | ORAL_TABLET | Freq: Two times a day (BID) | ORAL | Status: DC

## 2015-09-20 NOTE — Discharge Instructions (Signed)
Follow up as planned with your cardiologist.

## 2015-09-20 NOTE — ED Provider Notes (Signed)
CSN: [id number]     Arrival date & time 09/20/15  1320 History   First MD Initiated Contact with Patient 09/20/15 1342     Chief Complaint  Patient presents with  . Tachycardia     (Consider location/radiation/quality/duration/timing/severity/associated sxs/prior Treatment) Patient is a 40 y.o. female presenting with palpitations. The history is provided by the patient (Patient complains palpitations. Patient has history of SVT and has an appointment for cardiology to consider ablation 7).  Palpitations Palpitations quality:  Regular Onset quality:  Sudden Timing:  Constant Progression:  Unchanged Chronicity:  Recurrent Context: not anxiety   Associated symptoms: no back pain, no chest pain and no cough     Past Medical History  Diagnosis Date  . Diabetes mellitus without complication (HCC)   . Hypertension   . Anemia   . SVT (supraventricular tachycardia) (HCC)    History reviewed. No pertinent past surgical history. Family History  Problem Relation Age of Onset  . Diabetes Mother   . Heart failure Mother   . Diabetes Other    Social History  Substance Use Topics  . Smoking status: Never Smoker   . Smokeless tobacco: Never Used  . Alcohol Use: No   OB History    Gravida Para Term Preterm AB TAB SAB Ectopic Multiple Living   0     Review of Systems  Constitutional: Negative for appetite change and fatigue.  HENT: Negative for congestion, ear discharge and sinus pressure.   Eyes: Negative for discharge.  Respiratory: Negative for cough.   Cardiovascular: Positive for palpitations. Negative for chest pain.  Gastrointestinal: Negative for abdominal pain and diarrhea.  Genitourinary: Negative for frequency and hematuria.  Musculoskeletal: Negative for back pain.  Skin: Negative for rash.  Neurological: Negative for seizures and headaches.  Psychiatric/Behavioral: Negative for hallucinations.      Allergies  Lisinopril  Home Medications   Prior  to Admission medications   Medication Sig Start Date End Date Taking? Authorizing Provider  aspirin EC 81 MG tablet Take 81-162 mg by mouth daily as needed for mild pain (palpitations).    Yes Historical Provider, MD  ferrous sulfate 325 (65 FE) MG tablet Take 325 mg by mouth daily with breakfast.   Yes Historical Provider, MD  insulin aspart (NOVOLOG) 100 UNIT/ML injection Inject 5 Units into the skin 3 (three) times daily before meals. Per sliding scale instructions   Yes Historical Provider, MD  insulin glargine (LANTUS) 100 UNIT/ML injection Inject 0.1 mLs (10 Units total) into the skin at bedtime. Patient taking differently: Inject 10-50 Units into the skin 2 (two) times daily. Takes 50 units in the morning and 10 units at bedtime. 03/26/15  Yes Erick Blinks, MD  losartan (COZAAR) 50 MG tablet Take 50 mg by mouth daily.   Yes Historical Provider, MD  metFORMIN (GLUCOPHAGE) 1000 MG tablet Take 1,000 mg by mouth 2 (two) times daily with a meal.   Yes Historical Provider, MD  metoprolol tartrate (LOPRESSOR) 25 MG tablet TAKE ONE TABLET BY MOUTH ONCE DAILY AS NEEDED HEART  PALPITATIONS 09/12/15  Yes [name], MD  Multiple Vitamin (MULTIVITAMIN WITH MINERALS) TABS tablet Take 1 tablet by mouth daily.   Yes Historical Provider, MD  naproxen sodium (PAMPRIN ALL DAY RELIEF MAX ST) 220 MG tablet Take 440 mg by mouth 2 (two) times daily as needed (Pain).   Yes Historical Provider, MD  metoprolol succinate (TOPROL-XL) 50 MG 24 hr tablet Take 1  tablet (50 mg total) by mouth 2 (two) times daily. 09/20/15   Bethann BerkshireJoseph Abdulrahman Bracey, MD   BP 155/83 mmHg  Pulse 80  Temp(Src) 98 F (36.7 C) (Oral)  Resp 20  Ht 5\' 11"  (1.803 m)  Wt 280 lb (127.007 kg)  BMI 39.07 kg/m2  SpO2 100%  LMP 08/20/2015 Physical Exam  Constitutional: She is oriented to person, place, and time. She appears well-developed.  HENT:  Head: Normocephalic.  Eyes: Conjunctivae and EOM are normal. No scleral icterus.  Neck: Neck supple. No  thyromegaly present.  Cardiovascular: Regular rhythm.  Exam reveals no gallop and no friction rub.   No murmur heard. Rapid rate  Pulmonary/Chest: No stridor. She has no wheezes. She has no rales. She exhibits no tenderness.  Abdominal: She exhibits no distension. There is no tenderness. There is no rebound.  Musculoskeletal: Normal range of motion. She exhibits no edema.  Lymphadenopathy:    She has no cervical adenopathy.  Neurological: She is oriented to person, place, and time. She exhibits normal muscle tone. Coordination normal.  Skin: No rash noted. No erythema.  Psychiatric: She has a normal mood and affect. Her behavior is normal.    ED Course  Procedures (including critical care time) Labs Review Labs Reviewed  CBG MONITORING, ED - Abnormal; Notable for the following:    Glucose-Capillary 109 (*)    All other components within normal limits    Imaging Review No results found. I have personally reviewed and evaluated these images and lab results as part of my medical decision-making.   EKG Interpretation None     patient in SVT once again. Patient was given adenosine seen and converted to normal sinus rhythm. I spoke cardiology who suggested increasing her Toprol to 50 mg twice a day.   CRITICAL CARE Performed by: Joniah Bednarski L Total critical care time: 40 minutes Critical care time was exclusive of separately billable procedures and treating other patients. Critical care was necessary to treat or prevent imminent or life-threatening deterioration. Critical care was time spent personally by me on the following activities: development of treatment plan with patient and/or surrogate as well as nursing, discussions with consultants, evaluation of patient's response to treatment, examination of patient, obtaining history from patient or surrogate, ordering and performing treatments and interventions, ordering and review of laboratory studies, ordering and review of  radiographic studies, pulse oximetry and re-evaluation of patient's condition.   MDM   Final diagnoses:  SVT (supraventricular tachycardia) (HCC)    Patient with recurrent SVT is scheduled to see cardiology for possible ablation her Toprol will be increased to 50 mg twice a day    Bethann BerkshireJoseph Andersen Mckiver, MD 09/20/15 1627

## 2015-09-20 NOTE — ED Notes (Signed)
Racing heart since 1230.  Denies any pain.  C/o SOB.

## 2015-09-23 ENCOUNTER — Emergency Department (HOSPITAL_COMMUNITY)
Admission: EM | Admit: 2015-09-23 | Discharge: 2015-09-23 | Disposition: A | Discharge status: Home / Self Care | Payer: 59 | Attending: Emergency Medicine | Admitting: Emergency Medicine

## 2015-09-23 ENCOUNTER — Encounter (HOSPITAL_COMMUNITY): Payer: Self-pay

## 2015-09-23 ENCOUNTER — Telehealth: Payer: Self-pay | Admitting: Cardiology

## 2015-09-23 DIAGNOSIS — I471 Supraventricular tachycardia: Secondary | ICD-10-CM | POA: Diagnosis not present

## 2015-09-23 DIAGNOSIS — Z7984 Long term (current) use of oral hypoglycemic drugs: Secondary | ICD-10-CM | POA: Diagnosis not present

## 2015-09-23 DIAGNOSIS — E119 Type 2 diabetes mellitus without complications: Secondary | ICD-10-CM | POA: Diagnosis not present

## 2015-09-23 DIAGNOSIS — I10 Essential (primary) hypertension: Secondary | ICD-10-CM | POA: Insufficient documentation

## 2015-09-23 DIAGNOSIS — R0602 Shortness of breath: Secondary | ICD-10-CM | POA: Diagnosis not present

## 2015-09-23 DIAGNOSIS — R002 Palpitations: Secondary | ICD-10-CM | POA: Diagnosis present

## 2015-09-23 DIAGNOSIS — Z7982 Long term (current) use of aspirin: Secondary | ICD-10-CM | POA: Diagnosis not present

## 2015-09-23 DIAGNOSIS — Z79899 Other long term (current) drug therapy: Secondary | ICD-10-CM | POA: Insufficient documentation

## 2015-09-23 MED ORDER — ADENOSINE 6 MG/2ML IV SOLN
6.0000 mg | Freq: Once | INTRAVENOUS | Status: AC
  Administered 2015-09-23: 6 mg via INTRAVENOUS

## 2015-09-23 MED ORDER — LORAZEPAM 1 MG PO TABS
1.0000 mg | ORAL_TABLET | Freq: Once | ORAL | Status: AC
  Administered 2015-09-23: 1 mg via ORAL
  Filled 2015-09-23: qty 1

## 2015-09-23 MED ORDER — ADENOSINE 6 MG/2ML IV SOLN
INTRAVENOUS | Status: AC
  Filled 2015-09-23: qty 2

## 2015-09-23 NOTE — ED Notes (Signed)
Patient placed on zoll monitor 

## 2015-09-23 NOTE — Telephone Encounter (Signed)
[facility] - having more frequent episodes and has an appt with Cards in late March - please get in with Dr. Elberta Fortisamnitz this week ASAP to see

## 2015-09-23 NOTE — ED Provider Notes (Signed)
CSN: [id number]     Arrival date & time 09/23/15  1333 History   First MD Initiated Contact with Patient 09/23/15 1341     Chief Complaint  Patient presents with  . Tachycardia     (Consider location/radiation/quality/duration/timing/severity/associated sxs/prior Treatment) Patient is a 40 y.o. female presenting with palpitations.  Palpitations Palpitations quality:  Regular Onset quality:  Sudden Duration:  2 hours Timing:  Constant Chronicity:  Recurrent Context: not anxiety, not exercise, not illicit drugs and not nicotine   Ineffective treatments:  Beta blockers, deep relaxation, Valsalva and breathing exercises Associated symptoms: shortness of breath   Associated symptoms: no back pain and no chest pain   Risk factors: no diabetes mellitus and no hx of PE     Past Medical History  Diagnosis Date  . Diabetes mellitus without complication (HCC)   . Hypertension   . Anemia   . SVT (supraventricular tachycardia) (HCC)    History reviewed. No pertinent past surgical history. Family History  Problem Relation Age of Onset  . Diabetes Mother   . Heart failure Mother   . Diabetes Other    Social History  Substance Use Topics  . Smoking status: Never Smoker   . Smokeless tobacco: Never Used  . Alcohol Use: No   OB History    Gravida Para Term Preterm AB TAB SAB Ectopic Multiple Living   0     Review of Systems  Constitutional: Negative for fever, chills and fatigue.  HENT: Negative for congestion, ear pain and facial swelling.   Eyes: Negative for pain.  Respiratory: Positive for shortness of breath. Negative for chest tightness.   Cardiovascular: Positive for palpitations. Negative for chest pain.  Genitourinary: Negative for flank pain.  Musculoskeletal: Negative for back pain.  All other systems reviewed and are negative.     Allergies  Lisinopril  Home Medications   Prior to Admission medications   Medication Sig Start Date End Date  Taking? Authorizing Provider  aspirin EC 81 MG tablet Take 81-162 mg by mouth daily.    Yes Historical Provider, MD  ferrous sulfate 325 (65 FE) MG tablet Take 325 mg by mouth daily with breakfast.   Yes Historical Provider, MD  insulin aspart (NOVOLOG) 100 UNIT/ML injection Inject 5 Units into the skin 3 (three) times daily before meals. Per sliding scale instructions   Yes Historical Provider, MD  insulin glargine (LANTUS) 100 UNIT/ML injection Inject 0.1 mLs (10 Units total) into the skin at bedtime. Patient taking differently: Inject 10-50 Units into the skin 2 (two) times daily. Takes 50 units in the morning and 10 units at bedtime. 03/26/15  Yes Erick Blinks, MD  losartan (COZAAR) 50 MG tablet Take 50 mg by mouth daily.   Yes Historical Provider, MD  metFORMIN (GLUCOPHAGE) 1000 MG tablet Take 1,000 mg by mouth 2 (two) times daily with a meal.   Yes Historical Provider, MD  metoprolol succinate (TOPROL-XL) 50 MG 24 hr tablet Take 1 tablet (50 mg total) by mouth 2 (two) times daily. 09/20/15  Yes Bethann Berkshire, MD  metoprolol tartrate (LOPRESSOR) 25 MG tablet TAKE ONE TABLET BY MOUTH ONCE DAILY AS NEEDED HEART  PALPITATIONS 09/12/15  Yes [name], MD  Multiple Vitamin (MULTIVITAMIN WITH MINERALS) TABS tablet Take 1 tablet by mouth daily.   Yes Historical Provider, MD  naproxen sodium (PAMPRIN ALL DAY RELIEF MAX ST) 220 MG tablet Take 440 mg by mouth 2 (two) times daily  as needed (Pain).   Yes Historical Provider, MD   BP 173/76 mmHg  Pulse 78  Temp(Src) 98.6 F (37 C) (Oral)  Resp 21  SpO2 100%  LMP 08/20/2015 Physical Exam  Constitutional: She is oriented to person, place, and time. She appears well-developed and well-nourished.  HENT:  Head: Normocephalic and atraumatic.  Neck: Normal range of motion.  Cardiovascular: Regular rhythm.  Tachycardia present.   Pulmonary/Chest: Effort normal and breath sounds normal. No stridor. No respiratory distress. She has no wheezes. She has no  rales.  Abdominal: Soft. She exhibits no distension. There is no tenderness. There is no rebound.  Musculoskeletal: Normal range of motion. She exhibits no edema or tenderness.  Neurological: She is alert and oriented to person, place, and time. No cranial nerve deficit.  Skin: Skin is warm and dry.  Nursing note and vitals reviewed.   ED Course  Procedures (including critical care time) Labs Review Labs Reviewed - No data to display  Imaging Review No results found. I have personally reviewed and evaluated these images and lab results as part of my medical decision-making.   EKG Interpretation   Date/Time:  Sunday September 23 2015 14:00:00 EDT Ventricular Rate:  95 PR Interval:  168 QRS Duration: 83 QT Interval:  325 QTC Calculation: 408 R Axis:   17 Text Interpretation:  Sinus rhythm LVH by voltage Confirmed by Kashten Gowin MD,  Barbara CowerJASON [phone]([id number] on 09/23/2015 3:00:59 PM      MDM   Final diagnoses:  SVT (supraventricular tachycardia) (HCC)   SVT with some SOB, no other e/o instability. Adenosine given, returned to sinus rhythm. Cards follow up on 3/20. Will discuss if any other changes needed/earlier follow up. Patient consistently in a sinus rhythm with improvement in her tachypnea and felt ready for discharge.. No evidence of instability.  Repeat ECG in NSR. Discussed case with cardiology Dr. Mayford Knifeurner, who will attempt to get patient quicker follow-up as in this week. She will return here for any new or worsening symptoms.    Marily MemosJason Adalaide Jaskolski, MD 09/23/15 [id number]

## 2015-09-23 NOTE — ED Notes (Signed)
Complaining of fast heart rare that started about 30 minutes go. States she was here two days ago for same

## 2015-10-01 ENCOUNTER — Encounter: Payer: Self-pay | Admitting: Internal Medicine

## 2015-10-01 ENCOUNTER — Ambulatory Visit (INDEPENDENT_AMBULATORY_CARE_PROVIDER_SITE_OTHER): Payer: 59 | Admitting: Internal Medicine

## 2015-10-01 VITALS — BP 148/72 | HR 72 | Ht 71.0 in | Wt 290.6 lb

## 2015-10-01 DIAGNOSIS — I471 Supraventricular tachycardia: Secondary | ICD-10-CM

## 2015-10-01 DIAGNOSIS — I10 Essential (primary) hypertension: Secondary | ICD-10-CM

## 2015-10-01 NOTE — Patient Instructions (Signed)
Medication Instructions:  Your physician recommends that you continue on your current medications as directed. Please refer to the Current Medication list given to you today.    Labwork: None ordered   Testing/Procedures: Your physician has recommended that you have an ablation. Catheter ablation is a medical procedure used to treat some cardiac arrhythmias (irregular heartbeats). During catheter ablation, a long, thin, flexible tube is put into a blood vessel in your groin (upper thigh), or neck. This tube is called an ablation catheter. It is then guided to your heart through the blood vessel. Radio frequency waves destroy small areas of heart tissue where abnormal heartbeats may cause an arrhythmia to start. Please see the instruction sheet given to you today.   Scheduled for 01/01/16.  Will need to be at the hospital at 5:30am  Follow-Up: Your physician recommends that you schedule a follow-up appointment in: as scheduled   Any Other Special Instructions Will Be Listed Below (If Applicable).     If you need a refill on your cardiac medications before your next appointment, please call your pharmacy.

## 2015-10-01 NOTE — Progress Notes (Signed)
Electrophysiology Office Note   Date:  10/01/2015   ID:  Kathryn Morris, DOB 06/23/1976, MRN [id number]  PCP:  Kathryn Morris, Kathryn Morris  Cardiologist:  Dr Wyline MoodBranch Primary Electrophysiologist: Kathryn Morris, Kathryn Morris    Chief Complaint  Patient presents with  . Tachycardia     History of Present Illness: Kathryn Morris is a 40 y.o. female who presents today for electrophysiology evaluation.   The patient has recurrent symptomatic SVT.  She reports having abrupt onset/ offset of tachypalpitations.  She is unaware of triggers or precipitants.  She reports symptoms of "heart racing" and anxiety.  She has tried vagal maneuvers without improved.  Her first episode was several years ago.  She has had tachycardia requiring adenosine administration multiple times.  She saw me 9/16 and was offered ablation.  She did not proceed due to lack of insurance at the time.  She states that she now has insurance and is ready to have the procedure.  She cannot take off work however until after June 9th.  Today, she denies symptoms of palpitations, chest pain, shortness of breath, orthopnea, PND, lower extremity edema, claudication, dizziness, presyncope, syncope, bleeding, or neurologic sequela. The patient is tolerating medications without difficulties and is otherwise without complaint today.    Past Medical History  Diagnosis Date  . Diabetes mellitus without complication (HCC)   . Hypertension   . Anemia   . SVT (supraventricular tachycardia) (HCC)    No past surgical history on file.   Current Outpatient Prescriptions  Medication Sig Dispense Refill  . aspirin EC 81 MG tablet Take 81-162 mg by mouth daily.     . ferrous sulfate 325 (65 FE) MG tablet Take 325 mg by mouth daily with breakfast.    . insulin aspart (NOVOLOG) 100 UNIT/ML injection Inject 5 Units into the skin 3 (three) times daily before meals. Per sliding scale instructions    . insulin glargine (LANTUS) 100 UNIT/ML injection Inject 50 Units  into the skin daily. Pt also taking 10 units at bedtime    . losartan (COZAAR) 50 MG tablet Take 50 mg by mouth daily.    . metFORMIN (GLUCOPHAGE) 1000 MG tablet Take 1,000 mg by mouth 2 (two) times daily with a meal.    . metoprolol succinate (TOPROL-XL) 50 MG 24 hr tablet Take 50 mg by mouth 2 (two) times daily. Take with or immediately following a meal.    . Multiple Vitamin (MULTIVITAMIN WITH MINERALS) TABS tablet Take 1 tablet by mouth daily.    . naproxen sodium (PAMPRIN ALL DAY RELIEF MAX ST) 220 MG tablet Take 440 mg by mouth 2 (two) times daily as needed (Pain).     No current facility-administered medications for this visit.    Allergies:   Lisinopril   Social History:  The patient  reports that she has never smoked. She has never used smokeless tobacco. She reports that she does not drink alcohol or use illicit drugs.   Family History:  The patient's  family history includes Diabetes in her mother and other; Heart failure in her mother.    ROS:  Please see the history of present illness.   All other systems are reviewed and negative.    PHYSICAL EXAM: VS:  BP 148/72 mmHg  Pulse 72  Ht 5\' 11"  (1.803 m)  Wt 290 lb 9.6 oz (131.815 kg)  BMI 40.55 kg/m2  LMP 08/20/2015 , BMI Body mass index is 40.55 kg/(m^2). GEN: overweight, in no acute distress  HEENT: normal Neck: no JVD, carotid bruits, or masses Cardiac: RRR; no murmurs, rubs, or gallops,no edema  Respiratory:  clear to auscultation bilaterally, normal work of breathing GI: soft, nontender, nondistended, + BS MS: no deformity or atrophy Skin: warm and dry  Neuro:  Strength and sensation are intact Psych: euthymic mood, full affect  EKG:  EKG 03/26/15 is reviewed and reveals short RP SVT   Recent Labs: 03/21/2015: TSH 2.606 08/20/2015: ALT 21 09/09/2015: BUN 15; Creatinine, Ser 0.91; Hemoglobin 12.9; Magnesium 1.8; Platelets 433*; Potassium 3.6; Sodium 140    Lipid Panel  No results found for: CHOL, TRIG, HDL,  CHOLHDL, VLDL, LDLCALC, LDLDIRECT   Wt Readings from Last 3 Encounters:  10/01/15 290 lb 9.6 oz (131.815 kg)  09/20/15 280 lb (127.007 kg)  09/09/15 281 lb (127.461 kg)      Other studies Reviewed: Additional studies/ records that were reviewed today include: ED records, Dr Princess Perna records   ASSESSMENT AND PLAN:  1.  SVT The patient has documented recurrent adenosine sensitive short RP SVT. Therapeutic strategies for supraventricular tachycardia including medicine and ablation were discussed in detail with the patient today. Risk, benefits, and alternatives to EP study and radiofrequency ablation were also discussed in detail today. These risks include but are not limited to stroke, bleeding, vascular damage, tamponade, perforation, damage to the heart and other structures, AV block requiring pacemaker, worsening renal function, and death. The patient understands these risk and wishes to proceed.  We will therefore proceed with catheter ablation after June 9th (per her request).  She will need to hold metoprolol for 48 hours prior to the procedure.  2. Obesity Weight reduction advised  3. HTN Stable No change required today  Current medicines are reviewed at length with the patient today.   The patient does not have concerns regarding her medicines.  The following changes were made today:  none  Signed, Kathryn Range, Kathryn Morris  10/01/2015 2:21 PM     [facility] [address] [place] [address] [phone] (office) [phone] (fax)

## 2015-10-22 ENCOUNTER — Other Ambulatory Visit: Payer: Self-pay | Admitting: *Deleted

## 2015-10-22 MED ORDER — METOPROLOL SUCCINATE ER 50 MG PO TB24: 50.0000 mg | ORAL_TABLET | Freq: Two times a day (BID) | ORAL | Status: DC

## 2015-10-30 ENCOUNTER — Encounter (HOSPITAL_COMMUNITY): Payer: Self-pay | Admitting: *Deleted

## 2015-10-30 ENCOUNTER — Emergency Department (HOSPITAL_COMMUNITY)
Admission: EM | Admit: 2015-10-30 | Discharge: 2015-10-30 | Disposition: A | Discharge status: Home / Self Care | Payer: 59 | Attending: Emergency Medicine | Admitting: Emergency Medicine

## 2015-10-30 DIAGNOSIS — Z7982 Long term (current) use of aspirin: Secondary | ICD-10-CM | POA: Insufficient documentation

## 2015-10-30 DIAGNOSIS — E119 Type 2 diabetes mellitus without complications: Secondary | ICD-10-CM | POA: Diagnosis not present

## 2015-10-30 DIAGNOSIS — I10 Essential (primary) hypertension: Secondary | ICD-10-CM | POA: Insufficient documentation

## 2015-10-30 DIAGNOSIS — Z79899 Other long term (current) drug therapy: Secondary | ICD-10-CM | POA: Insufficient documentation

## 2015-10-30 DIAGNOSIS — R000 Tachycardia, unspecified: Secondary | ICD-10-CM | POA: Diagnosis present

## 2015-10-30 DIAGNOSIS — Z7984 Long term (current) use of oral hypoglycemic drugs: Secondary | ICD-10-CM | POA: Diagnosis not present

## 2015-10-30 DIAGNOSIS — Z794 Long term (current) use of insulin: Secondary | ICD-10-CM | POA: Diagnosis not present

## 2015-10-30 DIAGNOSIS — I471 Supraventricular tachycardia: Secondary | ICD-10-CM | POA: Diagnosis not present

## 2015-10-30 MED ORDER — ADENOSINE 6 MG/2ML IV SOLN
INTRAVENOUS | Status: AC
  Administered 2015-10-30: 6 mg via INTRAVENOUS
  Filled 2015-10-30: qty 8

## 2015-10-30 MED ORDER — METOPROLOL TARTRATE 1 MG/ML IV SOLN
5.0000 mg | Freq: Once | INTRAVENOUS | Status: AC
  Administered 2015-10-30: 5 mg via INTRAVENOUS
  Filled 2015-10-30: qty 5

## 2015-10-30 MED ORDER — ADENOSINE 6 MG/2ML IV SOLN
6.0000 mg | Freq: Once | INTRAVENOUS | Status: AC
  Administered 2015-10-30: 6 mg via INTRAVENOUS

## 2015-10-30 NOTE — ED Notes (Signed)
Pt states that she was in a meeting this evening when she started having a fast heartbeat with sob, has hx of svt,

## 2015-10-30 NOTE — ED Notes (Signed)
Dr Miller at bedside,  

## 2015-10-30 NOTE — Discharge Instructions (Signed)

## 2015-10-30 NOTE — ED Notes (Signed)
Pt hr decreased from 165 to 114, pt in sinus tach at present,

## 2015-10-31 NOTE — ED Provider Notes (Signed)
CSN: [id number]     Arrival date & time 10/30/15  2015 History   First MD Initiated Contact with Patient 10/30/15 2027     Chief Complaint  Patient presents with  . Tachycardia     (Consider location/radiation/quality/duration/timing/severity/associated sxs/prior Treatment) HPI Comments: 40 y/o female with hx of frequent SVT symptoms.  Has had cardiology evaluation and has had scheduled cardiology procedure for June, she had acute onset of palpitations within last hour - sx are persistent, nothing makes better or worse  - no prodromal symptoms, no assocaited cough, fever, swelling or CP.  Has needed adenosine multiple times in the past.  The history is provided by the patient.    Past Medical History  Diagnosis Date  . Diabetes mellitus without complication (HCC)   . Hypertension   . Anemia   . SVT (supraventricular tachycardia) (HCC)    History reviewed. No pertinent past surgical history. Family History  Problem Relation Age of Onset  . Diabetes Mother   . Heart failure Mother   . Diabetes Other    Social History  Substance Use Topics  . Smoking status: Never Smoker   . Smokeless tobacco: Never Used  . Alcohol Use: No   OB History    Gravida Para Term Preterm AB TAB SAB Ectopic Multiple Living   1    1  1    0     Review of Systems  All other systems reviewed and are negative.     Allergies  Lisinopril  Home Medications   Prior to Admission medications   Medication Sig Start Date End Date Taking? Authorizing Provider  aspirin EC 81 MG tablet Take 81-162 mg by mouth daily.    Yes Historical Provider, MD  ferrous sulfate 325 (65 FE) MG tablet Take 325 mg by mouth daily with breakfast.   Yes Historical Provider, MD  insulin aspart (NOVOLOG) 100 UNIT/ML injection Inject 5 Units into the skin 3 (three) times daily before meals. Per sliding scale instructions   Yes Historical Provider, MD  insulin glargine (LANTUS) 100 UNIT/ML injection Inject 50 Units into the skin  daily. Pt also taking 10 units at bedtime   Yes Historical Provider, MD  losartan (COZAAR) 50 MG tablet Take 50 mg by mouth daily.   Yes Historical Provider, MD  metFORMIN (GLUCOPHAGE) 1000 MG tablet Take 1,000 mg by mouth 2 (two) times daily with a meal.   Yes Historical Provider, MD  metoprolol succinate (TOPROL-XL) 50 MG 24 hr tablet Take 1 tablet (50 mg total) by mouth 2 (two) times daily. Take with or immediately following a meal. 10/22/15  Yes Hillis RangeJames Allred, MD  Multiple Vitamin (MULTIVITAMIN WITH MINERALS) TABS tablet Take 1 tablet by mouth daily.   Yes Historical Provider, MD  naproxen sodium (PAMPRIN ALL DAY RELIEF MAX ST) 220 MG tablet Take 440 mg by mouth 2 (two) times daily as needed (Pain).    Historical Provider, MD   BP 161/94 mmHg  Pulse 94  Temp(Src) 98.5 F (36.9 C) (Oral)  Resp 33  Ht 5\' 11"  (1.803 m)  Wt 290 lb (131.543 kg)  BMI 40.46 kg/m2  SpO2 97% Physical Exam  Constitutional: She appears well-developed and well-nourished. No distress.  HENT:  Head: Normocephalic and atraumatic.  Mouth/Throat: Oropharynx is clear and moist. No oropharyngeal exudate.  Eyes: Conjunctivae and EOM are normal. Pupils are equal, round, and reactive to light. Right eye exhibits no discharge. Left eye exhibits no discharge. No scleral icterus.  Neck: Normal range of  motion. Neck supple. No JVD present. No thyromegaly present.  Cardiovascular: Regular rhythm, normal heart sounds and intact distal pulses.  Exam reveals no gallop and no friction rub.   No murmur heard. Regular narrow complex tachycardia  Pulmonary/Chest: Effort normal and breath sounds normal. No respiratory distress. She has no wheezes. She has no rales.  Abdominal: Soft. Bowel sounds are normal. She exhibits no distension and no mass. There is no tenderness.  Musculoskeletal: Normal range of motion. She exhibits no edema or tenderness.  Lymphadenopathy:    She has no cervical adenopathy.  Neurological: She is alert.  Coordination normal.  Skin: Skin is warm and dry. No rash noted. No erythema.  Psychiatric: She has a normal mood and affect. Her behavior is normal.  Nursing note and vitals reviewed.   ED Course  Procedures (including critical care time) Labs Review Labs Reviewed - No data to display  Imaging Review No results found. I have personally reviewed and evaluated these images and lab results as part of my medical decision-making.   EKG Interpretation   Date/Time:  Tuesday October 30 2015 20:29:35 EDT Ventricular Rate:  141 PR Interval:    QRS Duration: 92 QT Interval:  314 QTC Calculation: 481 R Axis:   18 Text Interpretation:  Supraventricular tachycardia Nonspecific T wave  abnormality Abnormal ekg Since last tracing Supraventricular tachycardia  now present. Confirmed by Hyacinth Meeker  MD, Min Collymore ([id number]) on 10/31/2015 2:17:59  PM    EKG Interpretation  Date/Time:  Tuesday October 30 2015 20:57:03 EDT Ventricular Rate:  86 PR Interval:  172 QRS Duration: 89 QT Interval:  371 QTC Calculation: 444 R Axis:   14 Text Interpretation:  Sinus rhythm Left ventricular hypertrophy Since last tracing Supraventricular tachycardia has resolved Confirmed by Zykira Matlack  MD, Denton Derks ([id number]) on 10/31/2015 2:18:45 PM        MDM   Final diagnoses:  SVT (supraventricular tachycardia) (HCC)    Other than tachycardia - no other acute symptms -  Resolved with one dose of Adenosine and small amount of Lopressor Pt has scheduled cardiology f/u and appears stable after conversion  Expressed understanding for indication for f/u.  Meds given in ED:  Medications  adenosine (ADENOCARD) 6 MG/2ML injection 6 mg (6 mg Intravenous Given 10/30/15 2038)  metoprolol (LOPRESSOR) injection 5 mg (5 mg Intravenous Given 10/30/15 2047)    Discharge Medication List as of 10/30/2015  9:33 PM        Eber Hong, MD 10/31/15 1419

## 2015-11-08 ENCOUNTER — Emergency Department (HOSPITAL_COMMUNITY)
Admission: EM | Admit: 2015-11-08 | Discharge: 2015-11-08 | Disposition: A | Discharge status: Home / Self Care | Payer: 59 | Attending: Emergency Medicine | Admitting: Emergency Medicine

## 2015-11-08 ENCOUNTER — Encounter (HOSPITAL_COMMUNITY): Payer: Self-pay | Admitting: Emergency Medicine

## 2015-11-08 DIAGNOSIS — E119 Type 2 diabetes mellitus without complications: Secondary | ICD-10-CM | POA: Insufficient documentation

## 2015-11-08 DIAGNOSIS — Z7984 Long term (current) use of oral hypoglycemic drugs: Secondary | ICD-10-CM | POA: Diagnosis not present

## 2015-11-08 DIAGNOSIS — J189 Pneumonia, unspecified organism: Secondary | ICD-10-CM | POA: Insufficient documentation

## 2015-11-08 DIAGNOSIS — Z794 Long term (current) use of insulin: Secondary | ICD-10-CM | POA: Insufficient documentation

## 2015-11-08 DIAGNOSIS — I10 Essential (primary) hypertension: Secondary | ICD-10-CM | POA: Diagnosis not present

## 2015-11-08 DIAGNOSIS — R0602 Shortness of breath: Secondary | ICD-10-CM | POA: Diagnosis present

## 2015-11-08 DIAGNOSIS — Z7982 Long term (current) use of aspirin: Secondary | ICD-10-CM | POA: Diagnosis not present

## 2015-11-08 MED ORDER — PROMETHAZINE-CODEINE 6.25-10 MG/5ML PO SYRP: 5.0000 mL | ORAL_SOLUTION | ORAL | Status: DC | PRN

## 2015-11-08 MED ORDER — LEVOFLOXACIN 500 MG PO TABS
500.0000 mg | ORAL_TABLET | Freq: Once | ORAL | Status: AC
  Administered 2015-11-08: 500 mg via ORAL
  Filled 2015-11-08: qty 1

## 2015-11-08 NOTE — ED Notes (Signed)
Pt states she was seen at her doctor's this morning and dx with pneumonia.  States they did not want to give her albuterol due to hx of SVT and she felt she needed to come here.

## 2015-11-08 NOTE — ED Notes (Signed)
Pt verbalized understanding of no driving and to use caution within 4 hours of taking cough meds due to meds cause drowsiness 

## 2015-11-08 NOTE — ED Provider Notes (Signed)
CSN: [id number]     Arrival date & time 11/08/15  1109 History   First MD Initiated Contact with Patient 11/08/15 1122     Chief Complaint  Patient presents with  . Shortness of Breath     (Consider location/radiation/quality/duration/timing/severity/associated sxs/prior Treatment) The history is provided by the patient.   Kathryn Morris is a 40 y.o. female presenting for evaluation of pneumonia.  She was seen at her doctor's office this morning and chest x-ray confirmed pneumonia in her left lower lung.  She has a history of SVT and her physician did not feel comfortable prescribing her albuterol to help her with her shortness of breath.  She does not recognize specifically having wheezing but with the shortness of breath felt she needed this inhaler.  Other symptoms include low-grade fever along with coughing and production of yellow to green sputum.  Her symptoms started as an upper respiratory infection this past week which "settled in her lungs".  She denies orthopnea, chest pain or pressure and has had no peripheral edema.  She is a diabetic who hasn't been well controlled, states her CBG this morning was 234.  She was prescribed Levaquin which hasn't been started.   Past Medical History  Diagnosis Date  . Diabetes mellitus without complication (HCC)   . Hypertension   . Anemia   . SVT (supraventricular tachycardia) (HCC)    History reviewed. No pertinent past surgical history. Family History  Problem Relation Age of Onset  . Diabetes Mother   . Heart failure Mother   . Diabetes Other    Social History  Substance Use Topics  . Smoking status: Never Smoker   . Smokeless tobacco: Never Used  . Alcohol Use: No   OB History    Gravida Para Term Preterm AB TAB SAB Ectopic Multiple Living   1    1  1    0     Review of Systems  Constitutional: Positive for fever.  HENT: Positive for congestion and rhinorrhea. Negative for sore throat.   Eyes: Negative.   Respiratory:  Positive for cough and shortness of breath. Negative for chest tightness.   Cardiovascular: Negative for chest pain.  Gastrointestinal: Negative for nausea and abdominal pain.  Genitourinary: Negative.   Musculoskeletal: Negative for joint swelling, arthralgias and neck pain.  Skin: Negative.  Negative for rash and wound.  Neurological: Negative for dizziness, weakness, light-headedness, numbness and headaches.  Psychiatric/Behavioral: Negative.       Allergies  Lisinopril  Home Medications   Prior to Admission medications   Medication Sig Start Date End Date Taking? Authorizing Provider  aspirin EC 81 MG tablet Take 81-162 mg by mouth daily.    Yes Historical Provider, MD  ferrous sulfate 325 (65 FE) MG tablet Take 325 mg by mouth daily with breakfast.   Yes Historical Provider, MD  insulin aspart (NOVOLOG) 100 UNIT/ML injection Inject 5 Units into the skin 3 (three) times daily before meals. Per sliding scale instructions   Yes Historical Provider, MD  insulin glargine (LANTUS) 100 UNIT/ML injection Inject 50 Units into the skin daily. Pt also taking 10 units at bedtime   Yes Historical Provider, MD  losartan (COZAAR) 50 MG tablet Take 50 mg by mouth daily.   Yes Historical Provider, MD  metFORMIN (GLUCOPHAGE) 1000 MG tablet Take 1,000 mg by mouth 2 (two) times daily with a meal.   Yes Historical Provider, MD  metoprolol succinate (TOPROL-XL) 50 MG 24 hr tablet Take 1 tablet (50 mg  total) by mouth 2 (two) times daily. Take with or immediately following a meal. 10/22/15  Yes [name], MD  Multiple Vitamin (MULTIVITAMIN WITH MINERALS) TABS tablet Take 1 tablet by mouth daily.   Yes Historical Provider, MD  naproxen sodium (PAMPRIN ALL DAY RELIEF MAX ST) 220 MG tablet Take 440 mg by mouth 2 (two) times daily as needed (Pain).   Yes Historical Provider, MD  promethazine-codeine (PHENERGAN WITH CODEINE) 6.25-10 MG/5ML syrup Take 5 mLs by mouth every 4 (four) hours as needed for cough.  11/08/15   Burgess Amor, PA-C   BP 191/71 mmHg  Pulse 92  Temp(Src) 100.4 F (38 C) (Oral)  Resp 18  Ht  (1.803 m)  Wt 127.007 kg  BMI 39.07 kg/m2  SpO2 97%  LMP 10/29/2015 Physical Exam  Constitutional: She appears well-developed and well-nourished.  HENT:  Head: Normocephalic and atraumatic.  Eyes: Conjunctivae are normal.  Neck: Normal range of motion.  Cardiovascular: Normal rate, regular rhythm, normal heart sounds and intact distal pulses.   Pulmonary/Chest: Effort normal. No respiratory distress. She has no wheezes. She has rhonchi in the left lower field. She exhibits no tenderness.  Abdominal: Soft. Bowel sounds are normal. There is no tenderness.  Musculoskeletal: Normal range of motion. She exhibits no edema.  No ankle edema  Neurological: She is alert.  Skin: Skin is warm and dry.  Psychiatric: She has a normal mood and affect.  Nursing note and vitals reviewed.   ED Course  Procedures (including critical care time) Labs Review Labs Reviewed - No data to display  Imaging Review No results found. I have personally reviewed and evaluated these images and lab results as part of my medical decision-making.   EKG Interpretation None      MDM   Final diagnoses:  Community acquired pneumonia    No wheezing on exam with stable vital signs, low-grade fever.  Patient in no respiratory distress during this ED visit.  Discussed that albuterol would probably not be beneficial for her and may potentially exacerbate her SVT.  Discuss with Dr. Adriana Simas who also saw patient and agrees.  She was given her first dose of Levaquin while here, advised to get her prescription filled.  Rest, prescribed Phenergan with codeine for cough.  Plan follow-up with PCP as needed, returning here for any worsening symptoms.  The patient appears reasonably screened and/or stabilized for discharge and I doubt any other medical condition or other [facility] requiring further screening, evaluation,  or treatment in the ED at this time prior to discharge.     Burgess Amor, PA-C 11/08/15 1759  Donnetta Hutching, MD 11/09/15 [phone]

## 2015-11-08 NOTE — Discharge Instructions (Signed)

## 2015-11-18 ENCOUNTER — Emergency Department (HOSPITAL_COMMUNITY): Payer: 59

## 2015-11-18 ENCOUNTER — Emergency Department (HOSPITAL_COMMUNITY)
Admission: EM | Admit: 2015-11-18 | Discharge: 2015-11-18 | Disposition: A | Discharge status: Home / Self Care | Payer: 59 | Attending: Emergency Medicine | Admitting: Emergency Medicine

## 2015-11-18 ENCOUNTER — Encounter (HOSPITAL_COMMUNITY): Payer: Self-pay | Admitting: Emergency Medicine

## 2015-11-18 DIAGNOSIS — E119 Type 2 diabetes mellitus without complications: Secondary | ICD-10-CM | POA: Diagnosis not present

## 2015-11-18 DIAGNOSIS — Z791 Long term (current) use of non-steroidal anti-inflammatories (NSAID): Secondary | ICD-10-CM | POA: Insufficient documentation

## 2015-11-18 DIAGNOSIS — Z7984 Long term (current) use of oral hypoglycemic drugs: Secondary | ICD-10-CM | POA: Insufficient documentation

## 2015-11-18 DIAGNOSIS — Z7982 Long term (current) use of aspirin: Secondary | ICD-10-CM | POA: Diagnosis not present

## 2015-11-18 DIAGNOSIS — R002 Palpitations: Secondary | ICD-10-CM | POA: Insufficient documentation

## 2015-11-18 DIAGNOSIS — Z794 Long term (current) use of insulin: Secondary | ICD-10-CM | POA: Diagnosis not present

## 2015-11-18 DIAGNOSIS — I10 Essential (primary) hypertension: Secondary | ICD-10-CM | POA: Diagnosis not present

## 2015-11-18 DIAGNOSIS — R110 Nausea: Secondary | ICD-10-CM | POA: Diagnosis not present

## 2015-11-18 DIAGNOSIS — Z79899 Other long term (current) drug therapy: Secondary | ICD-10-CM | POA: Insufficient documentation

## 2015-11-18 DIAGNOSIS — R05 Cough: Secondary | ICD-10-CM | POA: Diagnosis not present

## 2015-11-18 LAB — COMPREHENSIVE METABOLIC PANEL
ALT: 18 U/L (ref 14–54)
ANION GAP: 8 (ref 5–15)
AST: 17 U/L (ref 15–41)
Albumin: 3.5 g/dL (ref 3.5–5.0)
Alkaline Phosphatase: 87 U/L (ref 38–126)
BUN: 10 mg/dL (ref 6–20)
CALCIUM: 9.3 mg/dL (ref 8.9–10.3)
CHLORIDE: 106 mmol/L (ref 101–111)
CO2: 27 mmol/L (ref 22–32)
Creatinine, Ser: 0.7 mg/dL (ref 0.44–1.00)
Glucose, Bld: 87 mg/dL (ref 65–99)
Potassium: 3.4 mmol/L — ABNORMAL LOW (ref 3.5–5.1)
SODIUM: 141 mmol/L (ref 135–145)
Total Bilirubin: 0.4 mg/dL (ref 0.3–1.2)
Total Protein: 7.6 g/dL (ref 6.5–8.1)

## 2015-11-18 LAB — CBC WITH DIFFERENTIAL/PLATELET
Basophils Absolute: 0 10*3/uL (ref 0.0–0.1)
Basophils Relative: 0 %
EOS ABS: 0.2 10*3/uL (ref 0.0–0.7)
EOS PCT: 3 %
HCT: 39 % (ref 36.0–46.0)
Hemoglobin: 12.6 g/dL (ref 12.0–15.0)
LYMPHS ABS: 3.8 10*3/uL (ref 0.7–4.0)
LYMPHS PCT: 41 %
MCH: 29.2 pg (ref 26.0–34.0)
MCHC: 32.3 g/dL (ref 30.0–36.0)
MCV: 90.5 fL (ref 78.0–100.0)
MONO ABS: 0.6 10*3/uL (ref 0.1–1.0)
MONOS PCT: 7 %
Neutro Abs: 4.7 10*3/uL (ref 1.7–7.7)
Neutrophils Relative %: 49 %
PLATELETS: 454 10*3/uL — AB (ref 150–400)
RBC: 4.31 MIL/uL (ref 3.87–5.11)
RDW: 14.4 % (ref 11.5–15.5)
WBC: 9.4 10*3/uL (ref 4.0–10.5)

## 2015-11-18 IMAGING — DX DG CHEST 2V
2 series · 2 of 2 positions shown · non-contrast
Comparison: [DATE]

CLINICAL DATA: Nausea, shortness of breath this morning, recent
pneumonia, still thickening antibiotics, history diabetes mellitus,
hypertension

EXAM:
CHEST  2 VIEW

[chest pa]
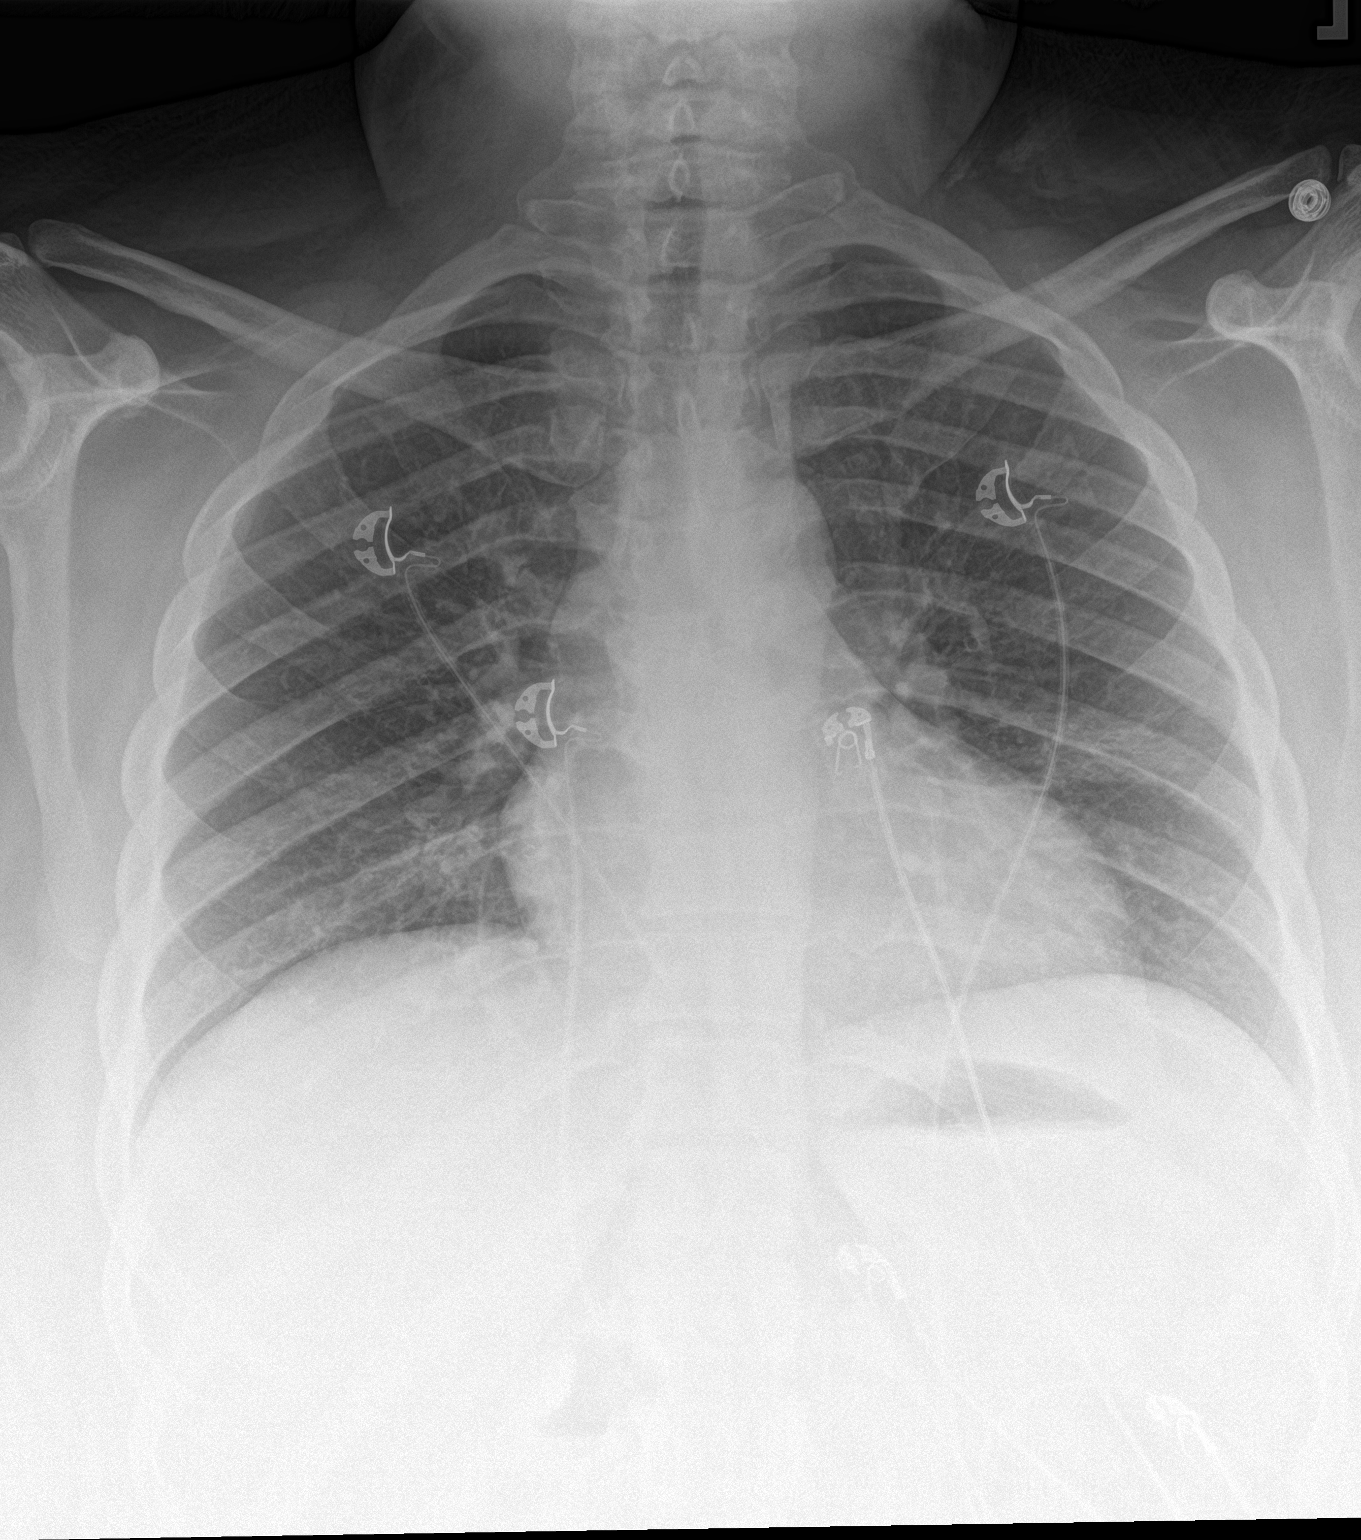

[chest lat]
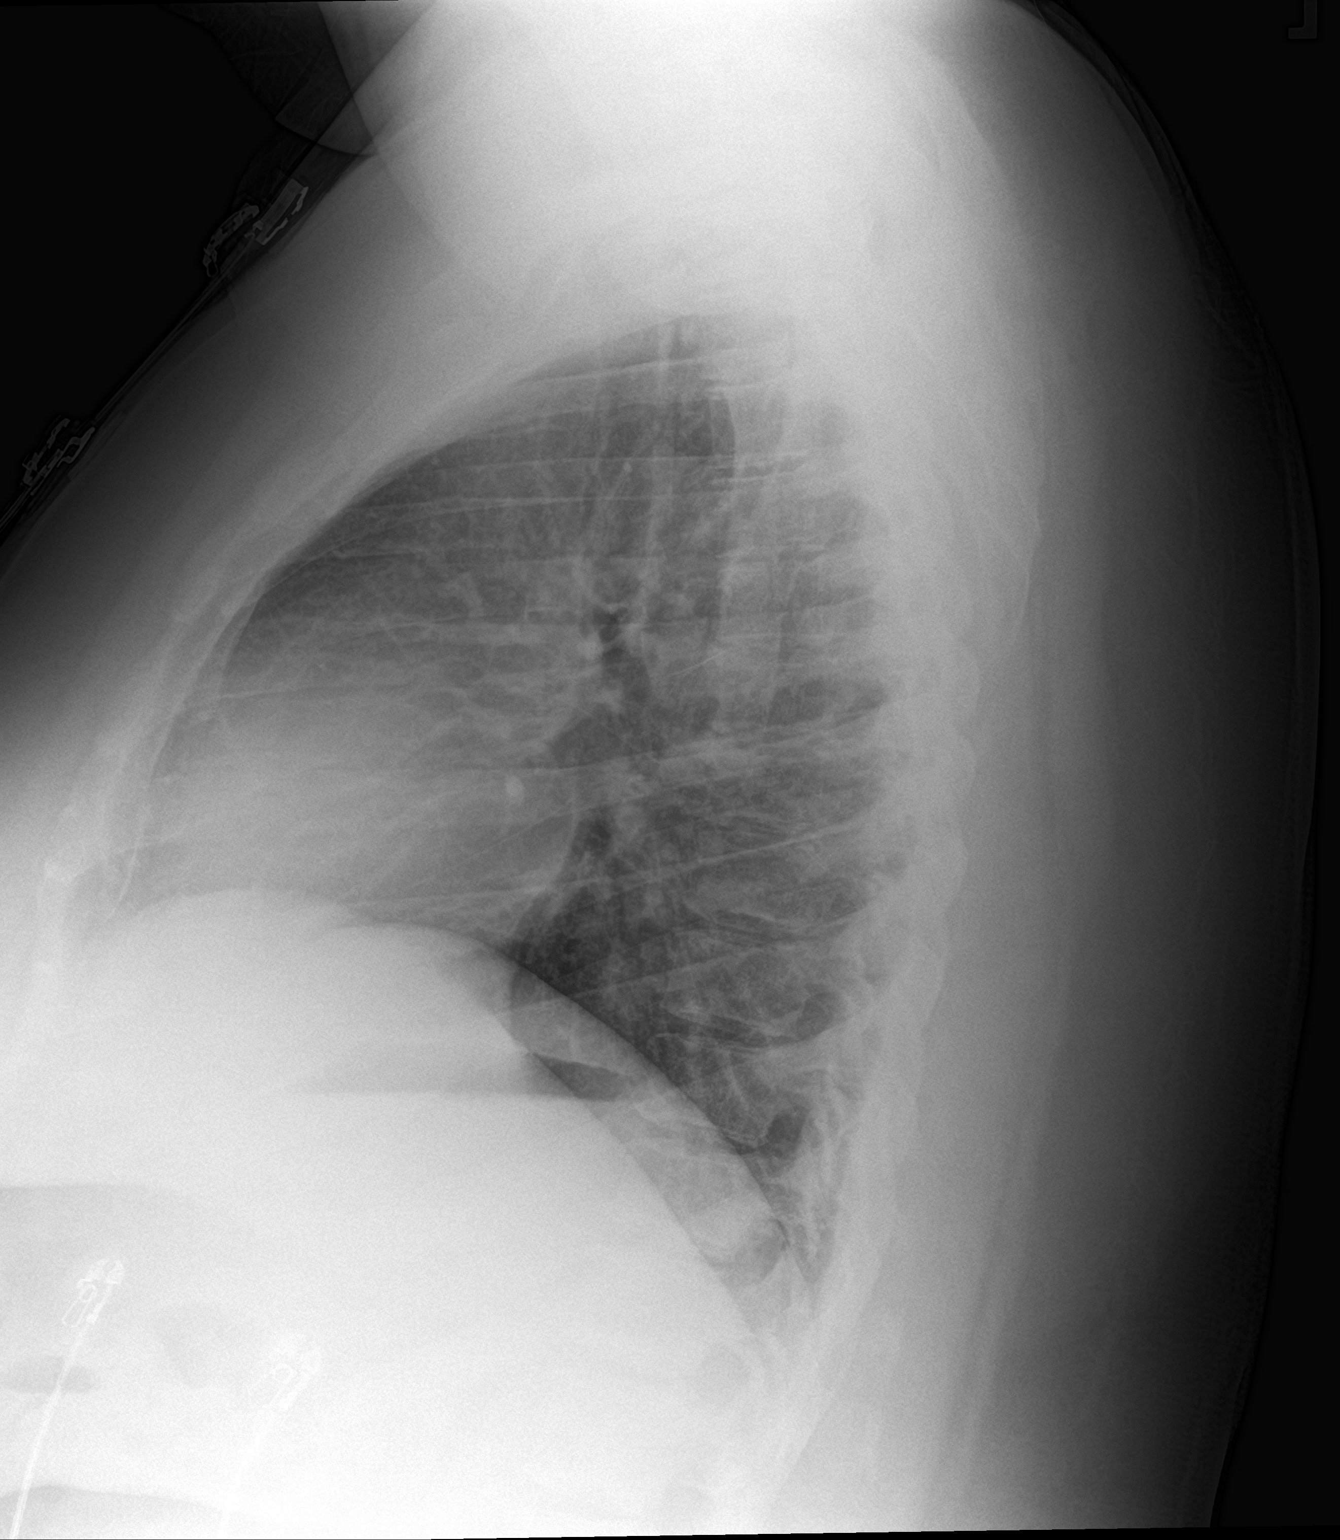

[2 of 2 positions shown; findings below may reference images not displayed]

FINDINGS: Enlargement of cardiac silhouette.

Mediastinal contours and pulmonary vascularity normal.

Minimal persistent peribronchial thickening.

Atelectasis or scarring in lingula unchanged.

No acute infiltrate, pleural effusion or pneumothorax.

Bones unremarkable.
IMPRESSION: Minimal chronic bronchitic changes with chronic atelectasis versus
scarring in lingula.

No acute infiltrate.

## 2015-11-18 MED ORDER — SODIUM CHLORIDE 0.9 % IV BOLUS (SEPSIS)
1000.0000 mL | Freq: Once | INTRAVENOUS | Status: AC
  Administered 2015-11-18: 1000 mL via INTRAVENOUS

## 2015-11-18 MED ORDER — ONDANSETRON HCL 4 MG/2ML IJ SOLN
4.0000 mg | Freq: Once | INTRAMUSCULAR | Status: AC
  Administered 2015-11-18: 4 mg via INTRAVENOUS
  Filled 2015-11-18: qty 2

## 2015-11-18 NOTE — ED Notes (Signed)
Pt reports palpitations, SOB, dizziness, and nausea that began today. Pt has hx of SVT. Also recently treated for pneumonia. AOx4

## 2015-11-18 NOTE — ED Notes (Signed)
Pt states she has been having intermittent episodes feeling as if her heart is speeding up as it has with her hx of SVT.  Pt states she has felt this way for the past few days progressively increasing since beginning Levaquin.  While assessing pt she stated she was "having an episode." No changes noted on monitor.  Advised pt to let me know if she felt it again so that monitor could be assessed.  Call light in reach.

## 2015-11-18 NOTE — ED Notes (Signed)
Pt states she has had "a few episodes" of palpitations.  Monitor reviewed and no abnormal rhythms found.

## 2015-11-18 NOTE — ED Notes (Signed)
No arhythmias noted on monitor.  Given po fluids.

## 2015-11-18 NOTE — ED Provider Notes (Signed)
CSN: [id number]     Arrival date & time 11/18/15  1410 History   First MD Initiated Contact with Patient 11/18/15 1413     Chief Complaint  Patient presents with  . Palpitations     (Consider location/radiation/quality/duration/timing/severity/associated sxs/prior Treatment) Patient is a 40 y.o. female presenting with palpitations.  Palpitations Palpitations quality:  Regular Onset quality:  Sudden Timing:  Intermittent Progression:  Improving Chronicity:  Recurrent Relieved by:  None tried Worsened by:  Nothing Ineffective treatments:  None tried Associated symptoms: cough and nausea     Past Medical History  Diagnosis Date  . Diabetes mellitus without complication (HCC)   . Hypertension   . Anemia   . SVT (supraventricular tachycardia) (HCC)    History reviewed. No pertinent past surgical history. Family History  Problem Relation Age of Onset  . Diabetes Mother   . Heart failure Mother   . Diabetes Other    Social History  Substance Use Topics  . Smoking status: Never Smoker   . Smokeless tobacco: Never Used  . Alcohol Use: No   OB History    Gravida Para Term Preterm AB TAB SAB Ectopic Multiple Living   0     Review of Systems  Respiratory: Positive for cough.   Cardiovascular: Positive for palpitations.  Gastrointestinal: Positive for nausea.  All other systems reviewed and are negative.     Allergies  Lisinopril  Home Medications   Prior to Admission medications   Medication Sig Start Date End Date Taking? Authorizing Provider  aspirin EC 81 MG tablet Take 81-162 mg by mouth daily.    Yes Historical Provider, MD  ferrous sulfate 325 (65 FE) MG tablet Take 325 mg by mouth daily with breakfast.   Yes Historical Provider, MD  ibuprofen (ADVIL,MOTRIN) 200 MG tablet Take 400 mg by mouth every 6 (six) hours as needed for moderate pain.   Yes Historical Provider, MD  insulin aspart (NOVOLOG) 100 UNIT/ML injection Inject 5 Units into the skin  3 (three) times daily before meals. Per sliding scale instructions   Yes Historical Provider, MD  insulin glargine (LANTUS) 100 UNIT/ML injection Inject 10-50 Units into the skin daily. 50 units in the morning and 10 units at bedtime.   Yes Historical Provider, MD  levofloxacin (LEVAQUIN) 500 MG tablet Take 500 mg by mouth daily. Starting 11/08/15.   Yes Historical Provider, MD  losartan (COZAAR) 50 MG tablet Take 50 mg by mouth daily.   Yes Historical Provider, MD  metFORMIN (GLUCOPHAGE) 1000 MG tablet Take 1,000 mg by mouth 2 (two) times daily with a meal.   Yes Historical Provider, MD  metoprolol succinate (TOPROL-XL) 50 MG 24 hr tablet Take 1 tablet (50 mg total) by mouth 2 (two) times daily. Take with or immediately following a meal. 10/22/15  Yes [name], MD  Multiple Vitamin (MULTIVITAMIN WITH MINERALS) TABS tablet Take 1 tablet by mouth daily.   Yes Historical Provider, MD  naproxen sodium (PAMPRIN ALL DAY RELIEF MAX ST) 220 MG tablet Take 440 mg by mouth 2 (two) times daily as needed (Pain).   Yes Historical Provider, MD   BP 156/78 mmHg  Pulse 62  Temp(Src) 97.8 F (36.6 C)  Resp 21  Ht  (1.803 m)  Wt 280 lb (127.007 kg)  BMI 39.07 kg/m2  SpO2 98%  LMP 10/29/2015 Physical Exam  Constitutional: She is oriented to person, place, and time. She appears well-developed and well-nourished.  HENT:  Head: Normocephalic and atraumatic.  Neck: Normal range of motion.  Cardiovascular: Normal rate and regular rhythm.   Pulmonary/Chest: No stridor. No respiratory distress.  Abdominal: Soft. She exhibits no distension. There is no tenderness.  Musculoskeletal: Normal range of motion. She exhibits no edema or tenderness.  Neurological: She is alert and oriented to person, place, and time. No cranial nerve deficit. Coordination normal.  Skin: Skin is warm and dry.  Nursing note and vitals reviewed.   ED Course  Procedures (including critical care time) Labs Review Labs Reviewed   CBC WITH DIFFERENTIAL/PLATELET - Abnormal; Notable for the following:    Platelets 454 (*)    All other components within normal limits  COMPREHENSIVE METABOLIC PANEL - Abnormal; Notable for the following:    Potassium 3.4 (*)    All other components within normal limits    Imaging Review Dg Chest 2 View  11/18/2015  CLINICAL DATA:  Nausea, shortness of breath this morning, recent pneumonia, still thickening antibiotics, history diabetes mellitus, hypertension EXAM: CHEST  2 VIEW COMPARISON:  08/20/2015 FINDINGS: Enlargement of cardiac silhouette. Mediastinal contours and pulmonary vascularity normal. Minimal persistent peribronchial thickening. Atelectasis or scarring in lingula unchanged. No acute infiltrate, pleural effusion or pneumothorax. Bones unremarkable. IMPRESSION: Minimal chronic bronchitic changes with chronic atelectasis versus scarring in lingula. No acute infiltrate. Electronically Signed   By: Ulyses SouthwardMark  Boles M.D.   On: 11/18/2015 15:46   I have personally reviewed and evaluated these images and lab results as part of my medical decision-making.   EKG Interpretation   Date/Time:  Sunday Nov 18 2015 14:19:06 EDT Ventricular Rate:  91 PR Interval:  173 QRS Duration: 85 QT Interval:  368 QTC Calculation: 453 R Axis:   35 Text Interpretation:  Sinus rhythm Borderline T wave abnormalities  Confirmed by [facility]ES[facility] MD, Barbara CowerJASON [phone]([id number]) on 11/18/2015 2:26:55 PM      MDM   Final diagnoses:  Palpitations    SVT with nause and sob prior to coming in, but resolved prior to ED visit. monitoried for >3 hours without recurrence. Doubt MI or other causes for symptoms. PERC negative, doubt PE.   New Prescriptions: Discharge Medication List as of 11/18/2015  5:31 PM       I have personally and contemperaneously reviewed labs and imaging and used in my decision making as above.   A medical screening exam was performed and I feel the patient has had an appropriate workup for their chief  complaint at this time and likelihood of emergent condition existing is low and thus workup can continue on an outpatient basis.. Their vital signs are stable. They have been counseled on decision, discharge, follow up and which symptoms necessitate immediate return to the emergency department.  They verbally stated understanding and agreement with plan and discharged in stable condition.      Marily MemosJason Jamaal Bernasconi, MD 11/18/15 [phone]

## 2015-11-25 ENCOUNTER — Encounter (HOSPITAL_COMMUNITY): Payer: Self-pay | Admitting: Emergency Medicine

## 2015-11-25 ENCOUNTER — Emergency Department (HOSPITAL_COMMUNITY): Payer: 59

## 2015-11-25 ENCOUNTER — Emergency Department (HOSPITAL_COMMUNITY)
Admission: EM | Admit: 2015-11-25 | Discharge: 2015-11-25 | Disposition: A | Discharge status: Home / Self Care | Payer: 59 | Attending: Emergency Medicine | Admitting: Emergency Medicine

## 2015-11-25 DIAGNOSIS — Z7982 Long term (current) use of aspirin: Secondary | ICD-10-CM | POA: Diagnosis not present

## 2015-11-25 DIAGNOSIS — Z79899 Other long term (current) drug therapy: Secondary | ICD-10-CM | POA: Diagnosis not present

## 2015-11-25 DIAGNOSIS — Z794 Long term (current) use of insulin: Secondary | ICD-10-CM | POA: Insufficient documentation

## 2015-11-25 DIAGNOSIS — I10 Essential (primary) hypertension: Secondary | ICD-10-CM | POA: Insufficient documentation

## 2015-11-25 DIAGNOSIS — E119 Type 2 diabetes mellitus without complications: Secondary | ICD-10-CM | POA: Insufficient documentation

## 2015-11-25 DIAGNOSIS — Z7984 Long term (current) use of oral hypoglycemic drugs: Secondary | ICD-10-CM | POA: Insufficient documentation

## 2015-11-25 DIAGNOSIS — Z791 Long term (current) use of non-steroidal anti-inflammatories (NSAID): Secondary | ICD-10-CM | POA: Insufficient documentation

## 2015-11-25 DIAGNOSIS — I471 Supraventricular tachycardia: Secondary | ICD-10-CM | POA: Diagnosis not present

## 2015-11-25 DIAGNOSIS — R002 Palpitations: Secondary | ICD-10-CM | POA: Diagnosis present

## 2015-11-25 LAB — COMPREHENSIVE METABOLIC PANEL
ALBUMIN: 3.8 g/dL (ref 3.5–5.0)
ALT: 20 U/L (ref 14–54)
ANION GAP: 6 (ref 5–15)
AST: 20 U/L (ref 15–41)
Alkaline Phosphatase: 101 U/L (ref 38–126)
BILIRUBIN TOTAL: 0.1 mg/dL — AB (ref 0.3–1.2)
BUN: 11 mg/dL (ref 6–20)
CHLORIDE: 103 mmol/L (ref 101–111)
CO2: 25 mmol/L (ref 22–32)
Calcium: 9.1 mg/dL (ref 8.9–10.3)
Creatinine, Ser: 0.7 mg/dL (ref 0.44–1.00)
GFR calc Af Amer: >60 mL/min (ref >60)
GFR calc non Af Amer: >60 mL/min (ref >60)
Glucose, Bld: 177 mg/dL — ABNORMAL HIGH (ref 65–99)
POTASSIUM: 3.5 mmol/L (ref 3.5–5.1)
SODIUM: 134 mmol/L — AB (ref 135–145)
Total Protein: 8.3 g/dL — ABNORMAL HIGH (ref 6.5–8.1)

## 2015-11-25 LAB — URINALYSIS, ROUTINE W REFLEX MICROSCOPIC
BILIRUBIN URINE: NEGATIVE
Glucose, UA: 100 mg/dL — AB
Hgb urine dipstick: NEGATIVE
Ketones, ur: NEGATIVE mg/dL
Leukocytes, UA: NEGATIVE
NITRITE: NEGATIVE
Protein, ur: NEGATIVE mg/dL
SPECIFIC GRAVITY, URINE: 1.01 (ref 1.005–1.030)
pH: 6 (ref 5.0–8.0)

## 2015-11-25 LAB — CBC
HCT: 38.9 % (ref 36.0–46.0)
Hemoglobin: 12.5 g/dL (ref 12.0–15.0)
MCH: 29.1 pg (ref 26.0–34.0)
MCHC: 32.1 g/dL (ref 30.0–36.0)
MCV: 90.7 fL (ref 78.0–100.0)
PLATELETS: 403 10*3/uL — AB (ref 150–400)
RBC: 4.29 MIL/uL (ref 3.87–5.11)
RDW: 14.6 % (ref 11.5–15.5)
WBC: 9 10*3/uL (ref 4.0–10.5)

## 2015-11-25 LAB — TSH: TSH: 2.232 u[IU]/mL (ref 0.350–4.500)

## 2015-11-25 LAB — MAGNESIUM: Magnesium: 1.6 mg/dL — ABNORMAL LOW (ref 1.7–2.4)

## 2015-11-25 LAB — TROPONIN I: Troponin I: <0.03 ng/mL (ref <0.031)

## 2015-11-25 MED ORDER — METOPROLOL TARTRATE 5 MG/5ML IV SOLN
5.0000 mg | Freq: Once | INTRAVENOUS | Status: AC
  Administered 2015-11-25: 5 mg via INTRAVENOUS

## 2015-11-25 MED ORDER — ADENOSINE 6 MG/2ML IV SOLN
INTRAVENOUS | Status: AC
  Administered 2015-11-25: 6 mg via INTRAVENOUS
  Filled 2015-11-25: qty 4

## 2015-11-25 MED ORDER — METOPROLOL SUCCINATE ER 50 MG PO TB24: ORAL_TABLET | ORAL | Status: DC

## 2015-11-25 MED ORDER — METOPROLOL TARTRATE 5 MG/5ML IV SOLN
INTRAVENOUS | Status: AC
  Administered 2015-11-25: 5 mg via INTRAVENOUS
  Filled 2015-11-25: qty 5

## 2015-11-25 MED ORDER — ADENOSINE 6 MG/2ML IV SOLN
6.0000 mg | Freq: Once | INTRAVENOUS | Status: AC
  Administered 2015-11-25: 6 mg via INTRAVENOUS

## 2015-11-25 MED ORDER — SODIUM CHLORIDE 0.9 % IV BOLUS (SEPSIS)
1000.0000 mL | Freq: Once | INTRAVENOUS | Status: AC
  Administered 2015-11-25: 1000 mL via INTRAVENOUS

## 2015-11-25 NOTE — Discharge Instructions (Signed)
Cardiac Ablation ° Cardiac ablation is a procedure to stop some heart tissue from causing problems. The heart has many electrical connections. Sometimes these connections cause the heart to beat very fast or irregularly. Removing some of the problem areas can improve heart rhythm or make it normal. Ablation is done for people who:  °· Have Wolff-Parkinson-White syndrome. °· Have other fast heart rhythms (tachycardia). °· Have taken medicines for an abnormal heart rhythm (arrhythmia) and the medicines had: °¨ No success. °¨ Side effects. °· May have a type of heartbeat that could cause death. °BEFORE THE PROCEDURE  °· Follow instructions from your doctor about eating and drinking before the procedure. °· Take your medicines as told by your doctor. Take them at regular times with water unless told differently by your doctor. °· If you are taking diabetes medicine, ask your doctor how to take it. Ask if there are any special instructions you should follow. Your doctor may change how much insulin you take the day of the procedure. °PROCEDURE  °· A special type of X-ray will be used. The X-ray helps your doctor see images of your heart during the procedure. °· A small cut (incision) will be made in your neck or groin. °· An IV tube will be started before the procedure begins. °· You will be given a numbing medicine (anesthetic) or a medicine to help you relax (sedative). °· The skin on your neck or groin will be numbed. °· A needle will be put into a large vein in your neck or groin. °· A thin, flexible tube (catheter) will be put in to reach your heart. °· A dye will be put in the tube. The dye will show up on X-rays. It will help your doctor see the area of the heart that needs treatment. °· When the heart tissue that is causing problems is found, the tip of the tube will send an electrical current to it. This will stop it from causing problems. °· The tube will be taken out. °· Pressure will be put on the area where  the tube was. This will keep it from bleeding. A bandage will be placed over the area. °AFTER THE PROCEDURE °· You will be taken to a recovery area. Your blood pressure, heart rate, and breathing will be watched. The area where the tube was will also be watched for bleeding. °· You will need to lie still for 4-6 hours. This keeps the area where the tube was from bleeding. °  °This information is not intended to replace advice given to you by your health care provider. Make sure you discuss any questions you have with your health care provider. °  °Document Released: 03/02/2013 Document Revised: 07/21/2014 Document Reviewed: 03/02/2013 °Elsevier Interactive Patient Education ©2016 [employer] ° °

## 2015-11-25 NOTE — ED Provider Notes (Signed)
CSN: [id number]     Arrival date & time 11/25/15  1659 History   First MD Initiated Contact with Patient 11/25/15 1707     Chief Complaint  Patient presents with  . Palpitations   PT IS HERE WITH SX OF SVT.  PT HAS HAD MULTIPLE VISITS FOR SVT.  PT SAID THAT IT STARTED AROUND 1630.  SHE TRIED VAGAL MANEUVERS AT HOME W/O SUCCESS.  PT IS SCHEDULED FOR AN ABLATION ON June 20.  PT WAS HERE 2 WEEKS AGO FOR THE SAME, BUT HER SX RESOLVED BY THE TIME SHE GOT TO A ROOM.  SHE HAS HAD ADENOSINE MULTIPLE TIMES.  (Consider location/radiation/quality/duration/timing/severity/associated sxs/prior Treatment) Patient is a 40 y.o. female presenting with palpitations. The history is provided by the patient.  Palpitations Palpitations quality:  Regular Onset quality:  Sudden Timing:  Constant Progression:  Unchanged Relieved by:  Nothing   Past Medical History  Diagnosis Date  . Diabetes mellitus without complication (HCC)   . Hypertension   . Anemia   . SVT (supraventricular tachycardia) (HCC)    History reviewed. No pertinent past surgical history. Family History  Problem Relation Age of Onset  . Diabetes Mother   . Heart failure Mother   . Diabetes Other    Social History  Substance Use Topics  . Smoking status: Never Smoker   . Smokeless tobacco: Never Used  . Alcohol Use: No   OB History    Gravida Para Term Preterm AB TAB SAB Ectopic Multiple Living   1    1  1    0     Review of Systems  Cardiovascular: Positive for palpitations.  All other systems reviewed and are negative.     Allergies  Lisinopril  Home Medications   Prior to Admission medications   Medication Sig Start Date End Date Taking? Authorizing Provider  aspirin EC 81 MG tablet Take 81-162 mg by mouth daily.    Yes Historical Provider, MD  ferrous sulfate 325 (65 FE) MG tablet Take 325 mg by mouth daily with breakfast.   Yes Historical Provider, MD  ibuprofen (ADVIL,MOTRIN) 200 MG tablet Take 400 mg by mouth  every 6 (six) hours as needed for moderate pain.   Yes Historical Provider, MD  insulin aspart (NOVOLOG) 100 UNIT/ML injection Inject 5 Units into the skin 3 (three) times daily before meals. Per sliding scale instructions   Yes Historical Provider, MD  insulin glargine (LANTUS) 100 UNIT/ML injection Inject 10-50 Units into the skin daily. 50 units in the morning and 10 units at bedtime.   Yes Historical Provider, MD  losartan (COZAAR) 50 MG tablet Take 50 mg by mouth daily.   Yes Historical Provider, MD  metFORMIN (GLUCOPHAGE) 1000 MG tablet Take 1,000 mg by mouth 2 (two) times daily with a meal.   Yes Historical Provider, MD  Multiple Vitamin (MULTIVITAMIN WITH MINERALS) TABS tablet Take 1 tablet by mouth daily.   Yes Historical Provider, MD  metoprolol succinate (TOPROL-XL) 50 MG 24 hr tablet TAKE 75 MG (1 AND 1/2 TABLETS) BID. Take with or immediately following a meal. 11/25/15   Jacalyn LefevreJulie Maham Quintin, MD   BP 129/71 mmHg  Pulse 85  Resp 21  SpO2 98%  LMP 11/25/2015 Physical Exam  Constitutional: She is oriented to person, place, and time. She appears well-developed and well-nourished.  HENT:  Head: Normocephalic and atraumatic.  Right Ear: External ear normal.  Left Ear: External ear normal.  Mouth/Throat: Oropharynx is clear and moist.  Eyes: Conjunctivae are  normal. Pupils are equal, round, and reactive to light.  Neck: Normal range of motion. Neck supple.  Cardiovascular: Regular rhythm.  Tachycardia present.   Pulmonary/Chest: Effort normal and breath sounds normal.  Abdominal: Soft. Bowel sounds are normal.  Musculoskeletal: Normal range of motion.  Neurological: She is alert and oriented to person, place, and time.  Skin: Skin is warm and dry.  Psychiatric: She has a normal mood and affect. Her behavior is normal. Judgment and thought content normal.  Nursing note and vitals reviewed.   ED Course  Procedures (including critical care time) Labs Review Labs Reviewed  CBC -  Abnormal; Notable for the following:    Platelets 403 (*)    All other components within normal limits  COMPREHENSIVE METABOLIC PANEL - Abnormal; Notable for the following:    Sodium 134 (*)    Glucose, Bld 177 (*)    Total Protein 8.3 (*)    Total Bilirubin 0.1 (*)    All other components within normal limits  MAGNESIUM - Abnormal; Notable for the following:    Magnesium 1.6 (*)    All other components within normal limits  TROPONIN I  URINALYSIS, ROUTINE W REFLEX MICROSCOPIC (NOT AT [facility])  TSH    Imaging Review No results found. I have personally reviewed and evaluated these images and lab results as part of my medical decision-making.   EKG Interpretation   Date/Time:  Sunday Nov 25 2015 17:13:43 EDT Ventricular Rate:  143 PR Interval:  110 QRS Duration: 77 QT Interval:  294 QTC Calculation: 453 R Axis:   24 Text Interpretation:  Sinus or ectopic atrial tachycardia Borderline  repolarization abnormality Confirmed by Martinez Boxx MD, Syrus Nakama ([id number]) on  11/25/2015 5:20:27 PM      MDM  AFTER 6 MG OF ADENOSINE AND 5 MG OF LOPRESSOR IV, PT'S HR IS NOW NSR.  PT REMAINS NSR FOR 2 HRS AND IS STABLE. PT D/W DR. Onalee Hua (ON CALL CARDS) WHO SUGGESTED INCREASING PT'S METOPROLOL TO 75 MG BID AND TO F/U WITH DR. ALLRED. Final diagnoses:  SVT (supraventricular tachycardia) (HCC)       Jacalyn Lefevre, MD 11/25/15 1909

## 2015-11-25 NOTE — ED Notes (Signed)
Patient c/o palpitations with shortness of breath. Patient reports hx of SVT in which this feels similar. Patient states she takes Metoprolol  for SVT.  Per patient started today at 4:30pm. Patient here 2 weeks ago for same. Patient to have ablation on June 20th.

## 2015-11-27 ENCOUNTER — Telehealth: Payer: Self-pay | Admitting: Internal Medicine

## 2015-11-27 NOTE — Telephone Encounter (Signed)
New Message  Pt called to discuss why she isnt scheduled for an ablation. Please call back to discuss.

## 2015-11-27 NOTE — Telephone Encounter (Signed)
Spoke with pt and she states that she called the office and someone told her that her ablation is not scheduled anymore. Advised pt that I see from her last OV visit that the AVS states that her ablation is 01/01/16 and she has to be at the hospital at 5:30AM. She said that Tresa EndoKelly did review this with her at last visit but she wanted to be sure it was still scheduled. Advised pt that Tresa EndoKelly is not in the office today but I will send her this message to follow up with her tomorrow to be sure that she is still scheduled for her ablation on 6/20. Pt verbalized understanding and was appreciative for return call. Pt states if we do not reach her on cell to call her work number [phone].

## 2015-11-27 NOTE — Telephone Encounter (Signed)
Attempted to call pt back.  No voicemail set up, unable to leave message.  Will try again later.

## 2015-11-28 NOTE — Telephone Encounter (Signed)
Returned call to patient, and left her a message that she is still scheduled for 6/20.  Keep her apt 6/12 and will give all instructions for procedure and have labs done then.

## 2015-12-21 ENCOUNTER — Encounter: Payer: Self-pay | Admitting: Obstetrics & Gynecology

## 2015-12-21 ENCOUNTER — Ambulatory Visit (INDEPENDENT_AMBULATORY_CARE_PROVIDER_SITE_OTHER): Payer: 59 | Admitting: Obstetrics & Gynecology

## 2015-12-21 VITALS — BP 174/80 | HR 66 | Ht 71.0 in | Wt 294.0 lb

## 2015-12-21 DIAGNOSIS — Z3202 Encounter for pregnancy test, result negative: Secondary | ICD-10-CM | POA: Diagnosis not present

## 2015-12-21 DIAGNOSIS — IMO0002 Reserved for concepts with insufficient information to code with codable children: Secondary | ICD-10-CM

## 2015-12-21 DIAGNOSIS — R896 Abnormal cytological findings in specimens from other organs, systems and tissues: Secondary | ICD-10-CM | POA: Diagnosis not present

## 2015-12-21 LAB — POCT URINE PREGNANCY: Preg Test, Ur: NEGATIVE

## 2015-12-21 MED ORDER — TERCONAZOLE 0.4 % VA CREA: 1.0000 | TOPICAL_CREAM | Freq: Every day | VAGINAL | Status: DC

## 2015-12-21 NOTE — Progress Notes (Signed)
Patient ID: Kathryn IsaacsLisa F Morris, female   DOB: 06/24/1976, 40 y.o.   MRN: [id number] Colposcopy Procedure Note:  Colposcopy Procedure Note  Indications: Pap smear 1 months ago showed: low-grade squamous intraepithelial neoplasia (LGSIL - encompassing HPV,mild dysplasia,CIN I). The prior pap showed no abnormalities.  Prior cervical/vaginal disease: . Prior cervical treatment: .  Smoker:  No. New sexual partner:  No.  : time frame:    History of abnormal Pap: no  Procedure Details  The risks and benefits of the procedure and Written informed consent obtained.  Speculum placed in vagina and excellent visualization of cervix achieved, cervix swabbed x 3 with acetic acid solution.  Findings: Cervix: no visible lesions, no mosaicism, no punctation and no abnormal vasculature; no biopsies taken. Vaginal inspection: normal without visible lesions. Vulvar colposcopy: vulvar colposcopy not performed.  Specimens: none  Complications: none.  Plan: Repeat Pap cytology 1 year

## 2015-12-24 ENCOUNTER — Encounter: Payer: Self-pay | Admitting: Internal Medicine

## 2015-12-24 ENCOUNTER — Ambulatory Visit (INDEPENDENT_AMBULATORY_CARE_PROVIDER_SITE_OTHER): Payer: 59 | Admitting: Internal Medicine

## 2015-12-24 VITALS — BP 168/80 | HR 61 | Ht 71.0 in | Wt 296.2 lb

## 2015-12-24 DIAGNOSIS — I10 Essential (primary) hypertension: Secondary | ICD-10-CM

## 2015-12-24 DIAGNOSIS — I471 Supraventricular tachycardia: Secondary | ICD-10-CM | POA: Diagnosis not present

## 2015-12-24 LAB — CBC WITH DIFFERENTIAL/PLATELET
BASOS ABS: 0 {cells}/uL (ref 0–200)
Basophils Relative: 0 %
EOS PCT: 2 %
Eosinophils Absolute: 144 cells/uL (ref 15–500)
HCT: 35.8 % (ref 35.0–45.0)
HEMOGLOBIN: 11.7 g/dL (ref 11.7–15.5)
LYMPHS ABS: 2376 {cells}/uL (ref 850–3900)
Lymphocytes Relative: 33 %
MCH: 29 pg (ref 27.0–33.0)
MCHC: 32.7 g/dL (ref 32.0–36.0)
MCV: 88.6 fL (ref 80.0–100.0)
MONOS PCT: 4 %
MPV: 9.9 fL (ref 7.5–12.5)
Monocytes Absolute: 288 cells/uL (ref 200–950)
NEUTROS ABS: 4392 {cells}/uL (ref 1500–7800)
NEUTROS PCT: 61 %
PLATELETS: 403 10*3/uL — AB (ref 140–400)
RBC: 4.04 MIL/uL (ref 3.80–5.10)
RDW: 14.9 % (ref 11.0–15.0)
WBC: 7.2 10*3/uL (ref 3.8–10.8)

## 2015-12-24 LAB — BASIC METABOLIC PANEL
BUN: 11 mg/dL (ref 7–25)
CALCIUM: 9.1 mg/dL (ref 8.6–10.2)
CO2: 25 mmol/L (ref 20–31)
Chloride: 104 mmol/L (ref 98–110)
Creat: 0.42 mg/dL — ABNORMAL LOW (ref 0.50–1.10)
Glucose, Bld: 173 mg/dL — ABNORMAL HIGH (ref 65–99)
POTASSIUM: 4 mmol/L (ref 3.5–5.3)
SODIUM: 138 mmol/L (ref 135–146)

## 2015-12-24 MED ORDER — LOSARTAN POTASSIUM 100 MG PO TABS: 100.0000 mg | ORAL_TABLET | Freq: Every day | ORAL | Status: DC

## 2015-12-24 NOTE — Progress Notes (Signed)
Electrophysiology Office Note   Date:  12/24/2015   ID:  Kathryn Morris, DOB 07/02/1976, MRN [id number]  PCP:  Alvina Filbertenise Hunter, MD  Cardiologist:  Dr Wyline MoodBranch Primary Electrophysiologist: Hillis RangeJames Denney Shein, MD    Chief Complaint  Patient presents with  . SVT     History of Present Illness: Kathryn Morris is a 40 y.o. female who presents today for electrophysiology evaluation.   The patient has recurrent symptomatic SVT.  She reports having abrupt onset/ offset of tachypalpitations.  She is unaware of triggers or precipitants.  She reports symptoms of "heart racing" and anxiety.  She has tried vagal maneuvers without improved.  Her first episode was several years ago.  Her last episode was at the end of April.  She has had tachycardia requiring adenosine administration multiple times.  She saw me 9/16 and was offered ablation.  She did not proceed due to lack of insurance at the time.  She states that she now has insurance and is ready to have the procedure.  Today, she denies symptoms of palpitations, chest pain, shortness of breath, orthopnea, PND, lower extremity edema, claudication, dizziness, presyncope, syncope, bleeding, or neurologic sequela. The patient is tolerating medications without difficulties and is otherwise without complaint today.    Past Medical History  Diagnosis Date  . Diabetes mellitus without complication (HCC)   . Hypertension   . Anemia   . SVT (supraventricular tachycardia) [facility](HCC)    Past Surgical History  Procedure Laterality Date  . No past surgeries       Current Outpatient Prescriptions  Medication Sig Dispense Refill  . aspirin EC 81 MG tablet Take 81-162 mg by mouth daily.     . ferrous sulfate 325 (65 FE) MG tablet Take 2 tablets by mouth in the AM and 1 tablet in the PM    . ibuprofen (ADVIL,MOTRIN) 200 MG tablet Take 400 mg by mouth every 6 (six) hours as needed for moderate pain.    Marland Kitchen. insulin aspart (NOVOLOG) 100 UNIT/ML injection Inject 5 Units into  the skin 3 (three) times daily before meals. Per sliding scale instructions    . insulin glargine (LANTUS) 100 UNIT/ML injection Inject 10-50 Units into the skin daily. 50 units in the morning and 10 units at bedtime.    Marland Kitchen. losartan (COZAAR) 50 MG tablet Take 75 mg by mouth daily.     . metFORMIN (GLUCOPHAGE) 1000 MG tablet Take 1,000 mg by mouth 2 (two) times daily with a meal.    . metoprolol succinate (TOPROL-XL) 50 MG 24 hr tablet TAKE 75 MG (1 AND 1/2 TABLETS) BY MOUTH TWICE A DAY. Take with or immediately following a meal.    . Multiple Vitamin (MULTIVITAMIN WITH MINERALS) TABS tablet Take 1 tablet by mouth daily.    Marland Kitchen. terconazole (TERAZOL 7) 0.4 % vaginal cream Place 1 applicator vaginally at bedtime. 45 g 0   No current facility-administered medications for this visit.    Allergies:   Lisinopril   Social History:  The patient  reports that she has never smoked. She has never used smokeless tobacco. She reports that she does not drink alcohol or use illicit drugs.   Family History:  The patient's  family history includes Diabetes in her mother and sister; Heart failure in her mother.    ROS:  Please see the history of present illness.   All other systems are reviewed and negative.    PHYSICAL EXAM: VS:  BP 168/80 mmHg  Pulse 61  Ht  (1.803 m)  Wt 296 lb 3.2 oz (134.355 kg)  BMI 41.33 kg/m2  LMP 11/25/2015 , BMI Body mass index is 41.33 kg/(m^2). GEN: overweight, in no acute distress HEENT: normal Neck: no JVD, carotid bruits, or masses Cardiac: RRR; no murmurs, rubs, or gallops,no edema  Respiratory:  clear to auscultation bilaterally, normal work of breathing GI: soft, nontender, nondistended, + BS MS: no deformity or atrophy Skin: warm and dry  Neuro:  Strength and sensation are intact Psych: euthymic mood, full affect  EKG:  EKG 03/26/15 is reviewed and reveals short RP SVT   Recent Labs: 11/25/2015: ALT 20; BUN 11; Creatinine, Ser 0.70; Hemoglobin 12.5;  Magnesium 1.6*; Platelets 403*; Potassium 3.5; Sodium 134*; TSH 2.232    Lipid Panel  No results found for: CHOL, TRIG, HDL, CHOLHDL, VLDL, LDLCALC, LDLDIRECT   Wt Readings from Last 3 Encounters:  12/24/15 296 lb 3.2 oz (134.355 kg)  12/21/15 294 lb (133.358 kg)  11/18/15 280 lb (127.007 kg)      Other studies Reviewed: Additional studies/ records that were reviewed today include: ED records, Dr Princess Perna records, my prior records  ASSESSMENT AND PLAN:  1.  SVT The patient has documented recurrent adenosine sensitive short RP SVT. Therapeutic strategies for supraventricular tachycardia including medicine and ablation were discussed in detail with the patient today. Risk, benefits, and alternatives to EP study and radiofrequency ablation were also discussed in detail today. These risks include but are not limited to stroke, bleeding, vascular damage, tamponade, perforation, damage to the heart and other structures, AV block requiring pacemaker, worsening renal function, and death. The patient understands these risk and wishes to proceed.  We will therefore proceed with catheter ablation at the next available time.  Hold ASA 5 days prior to the procedure.  Hold Metoprolol 36 hours prior to the procedure.  Pt aware. Will need anesthesia for this procedure.  2. Obesity Weight reduction advised  3. HTN Elevated Increase losartan to  daily  Current medicines are reviewed at length with the patient today.   The patient does not have concerns regarding her medicines.  The following changes were made today:  none  Signed, [name], MD  12/24/2015 10:39 AM     [facility] [address] [place] [address] [phone] (office) [phone] (fax)

## 2015-12-24 NOTE — Patient Instructions (Addendum)
Medication Instructions:  Your physician has recommended you make the following change in your medication:  1) Increase Losartan to 100 mg daily    Labwork: Your physician recommends that you return for lab work today": BMP/CBC   Testing/Procedures: Your physician has recommended that you have an ablation. Catheter ablation is a medical procedure used to treat some cardiac arrhythmias (irregular heartbeats). During catheter ablation, a long, thin, flexible tube is put into a blood vessel in your groin (upper thigh), or neck. This tube is called an ablation catheter. It is then guided to your heart through the blood vessel. Radio frequency waves destroy small areas of heart tissue where abnormal heartbeats may cause an arrhythmia to start. Please see the instruction sheet given to you today.---01/01/16   [facility] at [facility]:30[facility] day of procedure Do not eat or drink after midnight the night prior top the procedure Do not take any medications the morning of the procedure---Take only 1/2 Insulin dose am of procedure HOLD Aspirin 5 days prior to procedure HOLD Metoprolol 36 hours prior to procedure Plan for one night stay    Follow-Up: Your physician recommends that you schedule a follow-up appointment in: 4 weeks from 01/01/16 with Dr Johney FrameAllred   Any Other Special Instructions Will Be Listed Below (If Applicable).     If you need a refill on your cardiac medications before your next appointment, please call your pharmacy.  a

## 2015-12-31 ENCOUNTER — Telehealth: Payer: Self-pay | Admitting: Internal Medicine

## 2015-12-31 NOTE — Telephone Encounter (Signed)
New Message  Pt calling to speak w/ RN concerning surgery scheduled for tomorrow. Please call back and discuss.

## 2015-12-31 NOTE — Telephone Encounter (Signed)
Pt is calling back to discuss her SVT.

## 2015-12-31 NOTE — Telephone Encounter (Signed)
Returned call to patient, she is not at home and her VM on her cell is not set up.

## 2015-12-31 NOTE — Telephone Encounter (Signed)
Spoke with patient and she has been having SVT today and was worried about her procedure tomorrow.  She was wondering if she should go to the ER.  I told her to take it easy today and be at the hospital tomorrow at 5:30am.  She says that the episodes are short and have not sustained

## 2016-01-01 ENCOUNTER — Ambulatory Visit (HOSPITAL_COMMUNITY): Payer: 59 | Admitting: Anesthesiology

## 2016-01-01 ENCOUNTER — Encounter (HOSPITAL_COMMUNITY): Payer: Self-pay | Admitting: Anesthesiology

## 2016-01-01 ENCOUNTER — Encounter (HOSPITAL_COMMUNITY)
Admission: RE | Disposition: A | Discharge status: Home / Self Care | Payer: Self-pay | Source: Ambulatory Visit | Attending: Internal Medicine

## 2016-01-01 ENCOUNTER — Ambulatory Visit (HOSPITAL_COMMUNITY)
Admission: RE | Admit: 2016-01-01 | Discharge: 2016-01-01 | Disposition: A | Discharge status: Home / Self Care | Payer: 59 | Source: Ambulatory Visit | Attending: Internal Medicine | Admitting: Internal Medicine

## 2016-01-01 DIAGNOSIS — E669 Obesity, unspecified: Secondary | ICD-10-CM | POA: Insufficient documentation

## 2016-01-01 DIAGNOSIS — Z7982 Long term (current) use of aspirin: Secondary | ICD-10-CM | POA: Insufficient documentation

## 2016-01-01 DIAGNOSIS — D649 Anemia, unspecified: Secondary | ICD-10-CM | POA: Diagnosis not present

## 2016-01-01 DIAGNOSIS — Z79899 Other long term (current) drug therapy: Secondary | ICD-10-CM | POA: Insufficient documentation

## 2016-01-01 DIAGNOSIS — E119 Type 2 diabetes mellitus without complications: Secondary | ICD-10-CM | POA: Diagnosis not present

## 2016-01-01 DIAGNOSIS — I471 Supraventricular tachycardia: Secondary | ICD-10-CM | POA: Diagnosis not present

## 2016-01-01 DIAGNOSIS — Z6841 Body Mass Index (BMI) 40.0 and over, adult: Secondary | ICD-10-CM | POA: Diagnosis not present

## 2016-01-01 DIAGNOSIS — Z794 Long term (current) use of insulin: Secondary | ICD-10-CM | POA: Insufficient documentation

## 2016-01-01 DIAGNOSIS — I10 Essential (primary) hypertension: Secondary | ICD-10-CM | POA: Diagnosis not present

## 2016-01-01 HISTORY — PX: ELECTROPHYSIOLOGIC STUDY: SHX172A

## 2016-01-01 LAB — GLUCOSE, CAPILLARY
GLUCOSE-CAPILLARY: 124 mg/dL — AB (ref 65–99)
GLUCOSE-CAPILLARY: 123 mg/dL — AB (ref 65–99)
Glucose-Capillary: 140 mg/dL — ABNORMAL HIGH (ref 65–99)

## 2016-01-01 LAB — PREGNANCY, URINE: PREG TEST UR: NEGATIVE

## 2016-01-01 SURGERY — A-FLUTTER/A-TACH/SVT ABLATION
Anesthesia: Monitor Anesthesia Care

## 2016-01-01 MED ORDER — DEXTROSE 5 % IV SOLN
2.0000 ug/min | INTRAVENOUS | Status: DC
  Filled 2016-01-01: qty 5

## 2016-01-01 MED ORDER — MEPERIDINE HCL 25 MG/ML IJ SOLN: 6.2500 mg | INTRAMUSCULAR | Status: DC | PRN

## 2016-01-01 MED ORDER — BUPIVACAINE HCL (PF) 0.25 % IJ SOLN
INTRAMUSCULAR | Status: AC
  Filled 2016-01-01: qty 30

## 2016-01-01 MED ORDER — ACETAMINOPHEN 325 MG PO TABS: 650.0000 mg | ORAL_TABLET | ORAL | Status: DC | PRN

## 2016-01-01 MED ORDER — LOSARTAN POTASSIUM 50 MG PO TABS: 100.0000 mg | ORAL_TABLET | Freq: Every day | ORAL | Status: DC

## 2016-01-01 MED ORDER — FENTANYL CITRATE (PF) 100 MCG/2ML IJ SOLN
INTRAMUSCULAR | Status: DC | PRN
  Administered 2016-01-01: 25 ug via INTRAVENOUS
  Administered 2016-01-01: 50 ug via INTRAVENOUS
  Administered 2016-01-01: 25 ug via INTRAVENOUS

## 2016-01-01 MED ORDER — SODIUM CHLORIDE 0.9% FLUSH: 3.0000 mL | Freq: Two times a day (BID) | INTRAVENOUS | Status: DC

## 2016-01-01 MED ORDER — SODIUM CHLORIDE 0.9 % IV SOLN
2.0000 ug/min | INTRAVENOUS | Status: AC
  Administered 2016-01-01: 2 ug/min via INTRAVENOUS
  Filled 2016-01-01: qty 2

## 2016-01-01 MED ORDER — BUPIVACAINE HCL (PF) 0.25 % IJ SOLN
INTRAMUSCULAR | Status: DC | PRN
  Administered 2016-01-01: 53 mL

## 2016-01-01 MED ORDER — INSULIN ASPART 100 UNIT/ML ~~LOC~~ SOLN: 0.0000 [IU] | Freq: Three times a day (TID) | SUBCUTANEOUS | Status: DC

## 2016-01-01 MED ORDER — INSULIN GLARGINE 100 UNIT/ML ~~LOC~~ SOLN
10.0000 [IU] | Freq: Every day | SUBCUTANEOUS | Status: DC
  Filled 2016-01-01: qty 0.1

## 2016-01-01 MED ORDER — SODIUM CHLORIDE 0.9% FLUSH: 3.0000 mL | INTRAVENOUS | Status: DC | PRN

## 2016-01-01 MED ORDER — HYDROCODONE-ACETAMINOPHEN 5-325 MG PO TABS: 1.0000 | ORAL_TABLET | ORAL | Status: DC | PRN

## 2016-01-01 MED ORDER — INSULIN GLARGINE 100 UNIT/ML ~~LOC~~ SOLN: 50.0000 [IU] | Freq: Every day | SUBCUTANEOUS | Status: DC

## 2016-01-01 MED ORDER — METOPROLOL SUCCINATE ER 50 MG PO TB24
50.0000 mg | ORAL_TABLET | Freq: Every day | ORAL | Status: DC
  Administered 2016-01-01: 50 mg via ORAL
  Filled 2016-01-01: qty 1

## 2016-01-01 MED ORDER — HYDROMORPHONE HCL 1 MG/ML IJ SOLN: 0.2500 mg | INTRAMUSCULAR | Status: DC | PRN

## 2016-01-01 MED ORDER — SODIUM CHLORIDE 0.9 % IV SOLN
250.0000 mL | INTRAVENOUS | Status: DC | PRN
Start: 2016-01-01 — End: 2016-01-01

## 2016-01-01 MED ORDER — MIDAZOLAM HCL 5 MG/5ML IJ SOLN
INTRAMUSCULAR | Status: DC | PRN
  Administered 2016-01-01: 2 mg via INTRAVENOUS

## 2016-01-01 MED ORDER — LACTATED RINGERS IV SOLN: INTRAVENOUS | Status: DC

## 2016-01-01 MED ORDER — METOPROLOL SUCCINATE ER 50 MG PO TB24: 50.0000 mg | ORAL_TABLET | Freq: Every day | ORAL | Status: DC

## 2016-01-01 MED ORDER — PROMETHAZINE HCL 25 MG/ML IJ SOLN: 6.2500 mg | INTRAMUSCULAR | Status: DC | PRN

## 2016-01-01 MED ORDER — INSULIN GLARGINE 100 UNIT/ML ~~LOC~~ SOLN: 10.0000 [IU] | Freq: Every day | SUBCUTANEOUS | Status: DC

## 2016-01-01 MED ORDER — SODIUM CHLORIDE 0.9 % IV SOLN
INTRAVENOUS | Status: DC
  Administered 2016-01-01: 06:00:00 via INTRAVENOUS

## 2016-01-01 MED ORDER — ONDANSETRON HCL 4 MG/2ML IJ SOLN: 4.0000 mg | Freq: Four times a day (QID) | INTRAMUSCULAR | Status: DC | PRN

## 2016-01-01 MED ORDER — INSULIN ASPART 100 UNIT/ML ~~LOC~~ SOLN: 5.0000 [IU] | Freq: Three times a day (TID) | SUBCUTANEOUS | Status: DC

## 2016-01-01 MED ORDER — PROPOFOL 500 MG/50ML IV EMUL
INTRAVENOUS | Status: DC | PRN
  Administered 2016-01-01: 75 ug/kg/min via INTRAVENOUS

## 2016-01-01 SURGICAL SUPPLY — 12 items
BAG SNAP BAND KOVER 36X36 (MISCELLANEOUS) ×3 IMPLANT
BLANKET WARM UNDERBOD FULL ACC (MISCELLANEOUS) ×3 IMPLANT
CATH CELSIUS THERMO F CV 7FR (ABLATOR) ×3 IMPLANT
CATH JOSEPHSON QUAD-ALLRED 6FR (CATHETERS) ×6 IMPLANT
CATH WEB BIDIR CS D-F NONAUTO (CATHETERS) ×3 IMPLANT
PACK EP LATEX FREE (CUSTOM PROCEDURE TRAY) ×2
PACK EP LF (CUSTOM PROCEDURE TRAY) ×1 IMPLANT
PAD DEFIB LIFELINK (PAD) ×3 IMPLANT
SHEATH PINNACLE 6F 10CM (SHEATH) ×9 IMPLANT
SHEATH PINNACLE 7F 10CM (SHEATH) ×3 IMPLANT
SHEATH PINNACLE 8F 10CM (SHEATH) ×3 IMPLANT
SHIELD RADPAD SCOOP 12X17 (MISCELLANEOUS) ×3 IMPLANT

## 2016-01-01 NOTE — Progress Notes (Signed)
Site area: Right groin a 6, 7, 8 french venous sheath was removed  Site Prior to Removal:  Level 0  Pressure Applied For 15 MINUTES    Bedrest Beginning at 1035a  Manual:   Yes.    Patient Status During Pull:  stable  Post Pull Groin Site:  Level 0  Post Pull Instructions Given:  Yes.    Post Pull Pulses Present:  Yes.    Dressing Applied:  Yes.    Comments:  BP 146/80,   HR 74 SR,  Sat 99% RA

## 2016-01-01 NOTE — Anesthesia Postprocedure Evaluation (Signed)
Anesthesia Post Note  Patient: Demetrios IsaacsLisa F Gsell  Procedure(s) Performed: Procedure(s) (LRB): SVT Ablation (N/A)  Patient location during evaluation: PACU Anesthesia Type: MAC Level of consciousness: awake and alert Pain management: pain level controlled Vital Signs Assessment: post-procedure vital signs reviewed and stable Respiratory status: spontaneous breathing, nonlabored ventilation, respiratory function stable and patient connected to nasal cannula oxygen Cardiovascular status: stable and blood pressure returned to baseline Anesthetic complications: no    Last Vitals:  Filed Vitals:   01/01/16 1035 01/01/16 1230  BP: 171/78 176/86  Pulse: 69 93  Temp:    Resp:  16    Last Pain: There were no vitals filed for this visit.               Shelton SilvasKevin D Emani Taussig

## 2016-01-01 NOTE — Anesthesia Preprocedure Evaluation (Addendum)
Anesthesia Evaluation  Patient identified by MRN, date of birth, ID band Patient awake    Reviewed: Allergy & Precautions, NPO status , Patient's Chart, lab work & pertinent test results  Airway Mallampati: I  TM Distance: >3 FB Neck ROM: Full    Dental  (+) Teeth Intact, Dental Advisory Given   Pulmonary neg pulmonary ROS,    breath sounds clear to auscultation       Cardiovascular hypertension, Pt. on medications and Pt. on home beta blockers + dysrhythmias Supra Ventricular Tachycardia  Rhythm:Regular Rate:Normal     Neuro/Psych negative neurological ROS  negative psych ROS   GI/Hepatic negative GI ROS, Neg liver ROS,   Endo/Other  diabetes, Type 2, Oral Hypoglycemic Agents  Renal/GU negative Renal ROS  negative genitourinary   Musculoskeletal negative musculoskeletal ROS (+)   Abdominal   Peds negative pediatric ROS (+)  Hematology negative hematology ROS (+)   Anesthesia Other Findings   Reproductive/Obstetrics negative OB ROS                          Lab Results  Component Value Date   WBC 7.2 12/24/2015   HGB 11.7 12/24/2015   HCT 35.8 12/24/2015   MCV 88.6 12/24/2015   PLT 403* 12/24/2015   Lab Results  Component Value Date   CREATININE 0.42* 12/24/2015   BUN 11 12/24/2015   NA 138 12/24/2015   K 4.0 12/24/2015   CL 104 12/24/2015   CO2 25 12/24/2015   No results found for: INR, PROTIME  11/2015 EKG: sinus atrial tachycardia.   Anesthesia Physical Anesthesia Plan  ASA: III  Anesthesia Plan: MAC   Post-op Pain Management:    Induction: Intravenous  Airway Management Planned: Simple Face Mask and Natural Airway  Additional Equipment:   Intra-op Plan:   Post-operative Plan:   Informed Consent: I have reviewed the patients History and Physical, chart, labs and discussed the procedure including the risks, benefits and alternatives for the proposed anesthesia  with the patient or authorized representative who has indicated his/her understanding and acceptance.   Dental advisory given  Plan Discussed with: CRNA  Anesthesia Plan Comments:         Anesthesia Quick Evaluation

## 2016-01-01 NOTE — Anesthesia Procedure Notes (Signed)
Procedure Name: MAC Date/Time: 01/01/2016 7:52 AM Performed by: Quentin OreWALKER, Elishah Ashmore E Pre-anesthesia Checklist: Patient identified, Emergency Drugs available, Suction available, Patient being monitored and Timeout performed Patient Re-evaluated:Patient Re-evaluated prior to inductionOxygen Delivery Method: Simple face mask Intubation Type: IV induction Placement Confirmation: positive ETCO2

## 2016-01-01 NOTE — H&P (View-Only) (Signed)
Electrophysiology Office Note   Date:  12/24/2015   ID:  Kathryn Morris, DOB 07/02/1976, MRN [id number]  PCP:  Alvina Filbertenise Hunter, MD  Cardiologist:  Dr Wyline MoodBranch Primary Electrophysiologist: Hillis RangeJames Jacobs Golab, MD    Chief Complaint  Patient presents with  . SVT     History of Present Illness: Kathryn Morris is a 40 y.o. female who presents today for electrophysiology evaluation.   The patient has recurrent symptomatic SVT.  She reports having abrupt onset/ offset of tachypalpitations.  She is unaware of triggers or precipitants.  She reports symptoms of "heart racing" and anxiety.  She has tried vagal maneuvers without improved.  Her first episode was several years ago.  Her last episode was at the end of April.  She has had tachycardia requiring adenosine administration multiple times.  She saw me 9/16 and was offered ablation.  She did not proceed due to lack of insurance at the time.  She states that she now has insurance and is ready to have the procedure.  Today, she denies symptoms of palpitations, chest pain, shortness of breath, orthopnea, PND, lower extremity edema, claudication, dizziness, presyncope, syncope, bleeding, or neurologic sequela. The patient is tolerating medications without difficulties and is otherwise without complaint today.    Past Medical History  Diagnosis Date  . Diabetes mellitus without complication (HCC)   . Hypertension   . Anemia   . SVT (supraventricular tachycardia) [facility](HCC)    Past Surgical History  Procedure Laterality Date  . No past surgeries       Current Outpatient Prescriptions  Medication Sig Dispense Refill  . aspirin EC 81 MG tablet Take 81-162 mg by mouth daily.     . ferrous sulfate 325 (65 FE) MG tablet Take 2 tablets by mouth in the AM and 1 tablet in the PM    . ibuprofen (ADVIL,MOTRIN) 200 MG tablet Take 400 mg by mouth every 6 (six) hours as needed for moderate pain.    Marland Kitchen. insulin aspart (NOVOLOG) 100 UNIT/ML injection Inject 5 Units into  the skin 3 (three) times daily before meals. Per sliding scale instructions    . insulin glargine (LANTUS) 100 UNIT/ML injection Inject 10-50 Units into the skin daily. 50 units in the morning and 10 units at bedtime.    Marland Kitchen. losartan (COZAAR) 50 MG tablet Take 75 mg by mouth daily.     . metFORMIN (GLUCOPHAGE) 1000 MG tablet Take 1,000 mg by mouth 2 (two) times daily with a meal.    . metoprolol succinate (TOPROL-XL) 50 MG 24 hr tablet TAKE 75 MG (1 AND 1/2 TABLETS) BY MOUTH TWICE A DAY. Take with or immediately following a meal.    . Multiple Vitamin (MULTIVITAMIN WITH MINERALS) TABS tablet Take 1 tablet by mouth daily.    Marland Kitchen. terconazole (TERAZOL 7) 0.4 % vaginal cream Place 1 applicator vaginally at bedtime. 45 g 0   No current facility-administered medications for this visit.    Allergies:   Lisinopril   Social History:  The patient  reports that she has never smoked. She has never used smokeless tobacco. She reports that she does not drink alcohol or use illicit drugs.   Family History:  The patient's  family history includes Diabetes in her mother and sister; Heart failure in her mother.    ROS:  Please see the history of present illness.   All other systems are reviewed and negative.    PHYSICAL EXAM: VS:  BP 168/80 mmHg  Pulse 61  Ht 5' 11" (1.803 m)  Wt 296 lb 3.2 oz (134.355 kg)  BMI 41.33 kg/m2  LMP 11/25/2015 , BMI Body mass index is 41.33 kg/(m^2). GEN: overweight, in no acute distress HEENT: normal Neck: no JVD, carotid bruits, or masses Cardiac: RRR; no murmurs, rubs, or gallops,no edema  Respiratory:  clear to auscultation bilaterally, normal work of breathing GI: soft, nontender, nondistended, + BS MS: no deformity or atrophy Skin: warm and dry  Neuro:  Strength and sensation are intact Psych: euthymic mood, full affect  EKG:  EKG 03/26/15 is reviewed and reveals short RP SVT   Recent Labs: 11/25/2015: ALT 20; BUN 11; Creatinine, Ser 0.70; Hemoglobin 12.5;  Magnesium 1.6*; Platelets 403*; Potassium 3.5; Sodium 134*; TSH 2.232    Lipid Panel  No results found for: CHOL, TRIG, HDL, CHOLHDL, VLDL, LDLCALC, LDLDIRECT   Wt Readings from Last 3 Encounters:  12/24/15 296 lb 3.2 oz (134.355 kg)  12/21/15 294 lb (133.358 kg)  11/18/15 280 lb (127.007 kg)      Other studies Reviewed: Additional studies/ records that were reviewed today include: ED records, Dr Branchs records, my prior records  ASSESSMENT AND PLAN:  1.  SVT The patient has documented recurrent adenosine sensitive short RP SVT. Therapeutic strategies for supraventricular tachycardia including medicine and ablation were discussed in detail with the patient today. Risk, benefits, and alternatives to EP study and radiofrequency ablation were also discussed in detail today. These risks include but are not limited to stroke, bleeding, vascular damage, tamponade, perforation, damage to the heart and other structures, AV block requiring pacemaker, worsening renal function, and death. The patient understands these risk and wishes to proceed.  We will therefore proceed with catheter ablation at the next available time.  Hold ASA 5 days prior to the procedure.  Hold Metoprolol 36 hours prior to the procedure.  Pt aware. Will need anesthesia for this procedure.  2. Obesity Weight reduction advised  3. HTN Elevated Increase losartan to 100mg daily  Current medicines are reviewed at length with the patient today.   The patient does not have concerns regarding her medicines.  The following changes were made today:  none  Signed, Denim Start, MD  12/24/2015 10:39 AM     [facility] [address] [place] [address] [phone] (office) [phone] (fax)  

## 2016-01-01 NOTE — Transfer of Care (Signed)
Immediate Anesthesia Transfer of Care Note  Patient: Kathryn IsaacsLisa F Morris  Procedure(s) Performed: Procedure(s): SVT Ablation (N/A)  Patient Location: Cath Lab  Anesthesia Type:MAC  Level of Consciousness: awake, alert  and oriented  Airway & Oxygen Therapy: Patient Spontanous Breathing and Patient connected to nasal cannula oxygen  Post-op Assessment: Report given to RN and Post -op Vital signs reviewed and stable  Post vital signs: Reviewed and stable  Last Vitals:  Filed Vitals:   01/01/16 0542  BP: 175/88  Pulse: 83  Temp: 37.1 C  Resp: 18    Last Pain: There were no vitals filed for this visit.    Patients Stated Pain Goal: 3 (01/01/16 0615)  Complications: No apparent anesthesia complications

## 2016-01-01 NOTE — Progress Notes (Signed)
Pt being discharged. Bedrest is complete. Pt ambulated in hall, no groin site complications. Pt discharged home. Discharge instructions have been gone over with the patient. IV's removed. Pt given unit number and told to call if they have any concerns regarding their discharge instructions. Cecille Rubinhompson,Rhonda Vangieson V, RN

## 2016-01-01 NOTE — Progress Notes (Signed)
The patient is seen by Dr. Johney FrameAllred, post procedure, activity restrictions discussed.  Patient is in SR, cleared to discharge when bed rest is completed and if groin remains stable after ambulation. Her home dose of metoprolol is being decreased to 50mg  daily.  Francis Dowseenee Ursuy, PA-C

## 2016-01-01 NOTE — Progress Notes (Signed)
PHARMACIST - PHYSICIAN COMMUNICATION  CONCERNING:  Home DM regimen    DESCRIPTION: Her home insulin regimen has been resumed -- Lantus 50 units daily, 10 units HS SSI also added as per her home regimen  Further management per MD Discharge planned soon    Thank you Okey RegalLisa Tiberius Loftus, PharmD

## 2016-01-01 NOTE — Discharge Instructions (Signed)
No driving for 3 days. No lifting over 5 lbs for 1 week. No vigorous or sexual activity for 1 week. You may return to work on 01/08/16. Keep procedure site clean & dry. If you notice increased pain, swelling, bleeding or pus, call/return!  You may shower, but no soaking baths/hot tubs/pools for 1 week.

## 2016-01-01 NOTE — Interval H&P Note (Signed)
History and Physical Interval Note:  01/01/2016 7:32 AM  Demetrios IsaacsLisa F Hagerman  has presented today for surgery, with the diagnosis of svt  The various methods of treatment have been discussed with the patient and family. After consideration of risks, benefits and other options for treatment, the patient has consented to  Procedure(s): SVT Ablation (N/A) as a surgical intervention .  The patient's history has been reviewed, patient examined, no change in status, stable for surgery.  I have reviewed the patient's chart and labs.  Questions were answered to the patient's satisfaction.    Risk, benefits, and alternatives to EP study and radiofrequency ablation were also discussed in detail today. These risks include but are not limited to stroke, bleeding, vascular damage, tamponade, perforation, damage to the heart and other structures, AV block requiring pacemaker, worsening renal function, and death. The patient understands these risk and wishes to proceed.      Hillis RangeJames Arminta Gamm

## 2016-02-04 ENCOUNTER — Ambulatory Visit: Payer: 59 | Admitting: Internal Medicine

## 2016-02-27 ENCOUNTER — Ambulatory Visit: Payer: 59 | Admitting: Internal Medicine

## 2016-04-10 NOTE — Progress Notes (Signed)
This encounter was created in error - please disregard.

## 2016-05-05 ENCOUNTER — Emergency Department (HOSPITAL_COMMUNITY)
Admission: EM | Admit: 2016-05-05 | Discharge: 2016-05-05 | Disposition: A | Discharge status: Home / Self Care | Payer: 59 | Attending: Emergency Medicine | Admitting: Emergency Medicine

## 2016-05-05 ENCOUNTER — Emergency Department (HOSPITAL_COMMUNITY): Payer: 59

## 2016-05-05 ENCOUNTER — Encounter (HOSPITAL_COMMUNITY): Payer: Self-pay | Admitting: Emergency Medicine

## 2016-05-05 DIAGNOSIS — I10 Essential (primary) hypertension: Secondary | ICD-10-CM | POA: Insufficient documentation

## 2016-05-05 DIAGNOSIS — Z79899 Other long term (current) drug therapy: Secondary | ICD-10-CM | POA: Insufficient documentation

## 2016-05-05 DIAGNOSIS — Z791 Long term (current) use of non-steroidal anti-inflammatories (NSAID): Secondary | ICD-10-CM | POA: Diagnosis not present

## 2016-05-05 DIAGNOSIS — R519 Headache, unspecified: Secondary | ICD-10-CM

## 2016-05-05 DIAGNOSIS — E119 Type 2 diabetes mellitus without complications: Secondary | ICD-10-CM | POA: Insufficient documentation

## 2016-05-05 DIAGNOSIS — Z794 Long term (current) use of insulin: Secondary | ICD-10-CM | POA: Diagnosis not present

## 2016-05-05 DIAGNOSIS — R110 Nausea: Secondary | ICD-10-CM | POA: Insufficient documentation

## 2016-05-05 DIAGNOSIS — Z7982 Long term (current) use of aspirin: Secondary | ICD-10-CM | POA: Insufficient documentation

## 2016-05-05 DIAGNOSIS — R51 Headache: Secondary | ICD-10-CM | POA: Insufficient documentation

## 2016-05-05 LAB — I-STAT CHEM 8, ED
BUN: 8 mg/dL (ref 6–20)
CALCIUM ION: 1.13 mmol/L — AB (ref 1.15–1.40)
Chloride: 102 mmol/L (ref 101–111)
Creatinine, Ser: 0.9 mg/dL (ref 0.44–1.00)
Glucose, Bld: 103 mg/dL — ABNORMAL HIGH (ref 65–99)
HEMATOCRIT: 37 % (ref 36.0–46.0)
HEMOGLOBIN: 12.6 g/dL (ref 12.0–15.0)
Potassium: 3.7 mmol/L (ref 3.5–5.1)
SODIUM: 142 mmol/L (ref 135–145)
TCO2: 27 mmol/L (ref 0–100)

## 2016-05-05 LAB — CBG MONITORING, ED: GLUCOSE-CAPILLARY: 108 mg/dL — AB (ref 65–99)

## 2016-05-05 IMAGING — CT CT HEAD W/O CM
3 series · 16 of 47 positions shown, 19 images · non-contrast
Comparison: None.

CLINICAL DATA: New onset headaches for 3 days. Nausea. Initial
encounter.

EXAM:
CT HEAD WITHOUT CONTRAST
TECHNIQUE: Contiguous axial images were obtained from the base of the skull
through the vertex without intravenous contrast.

[Series 2: head wo · axial · 0.42mm/px · z∈[+83,+208]mm · 10 of 31 slices shown, 13 images]
[im 3/31  brain]
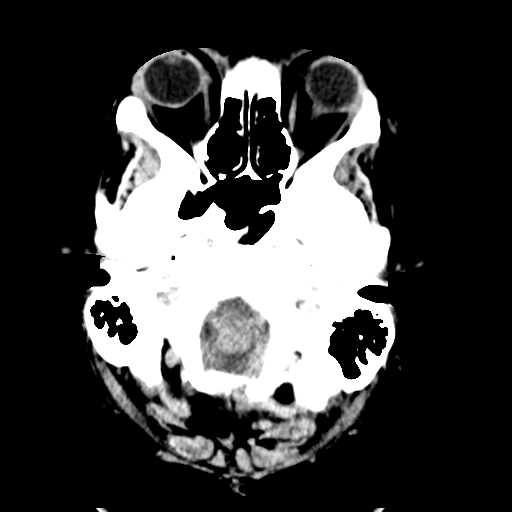
[im 3/31  bone]
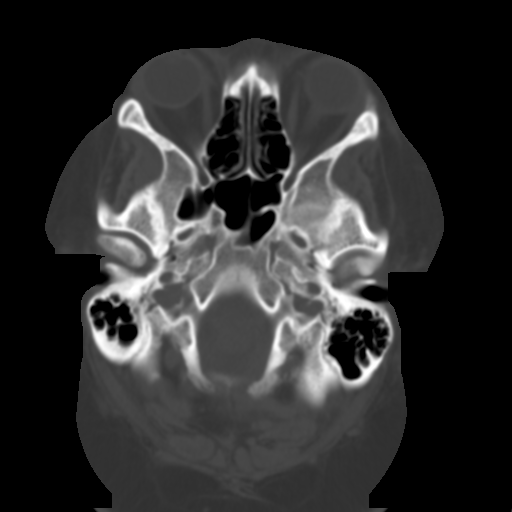
[im 6/31  brain]
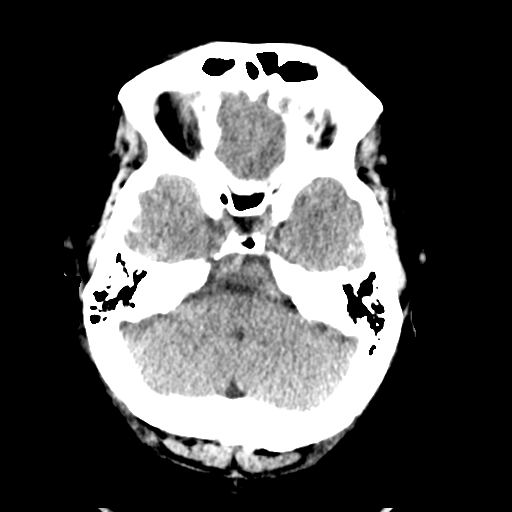
[im 9/31  brain]
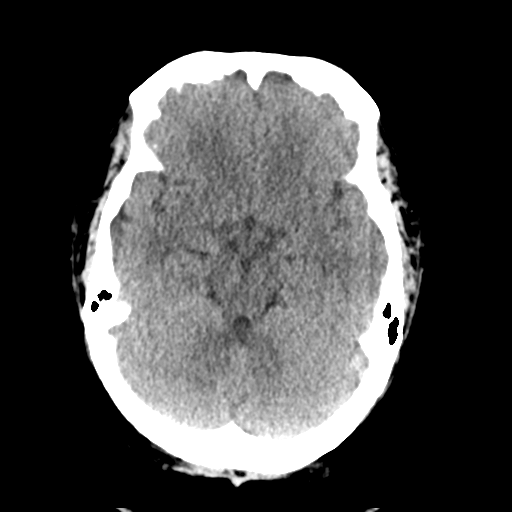
[im 11/31  brain]
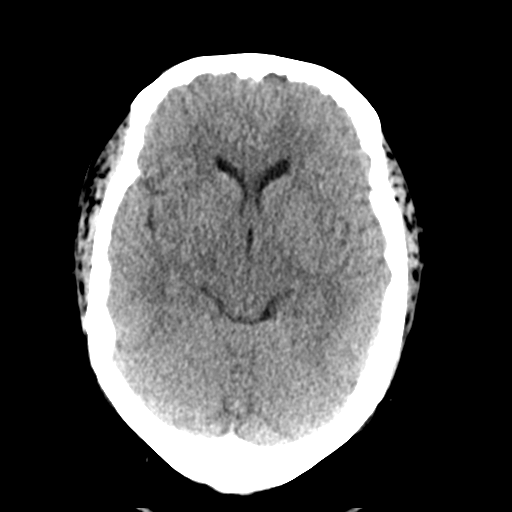
[im 14/31  brain]
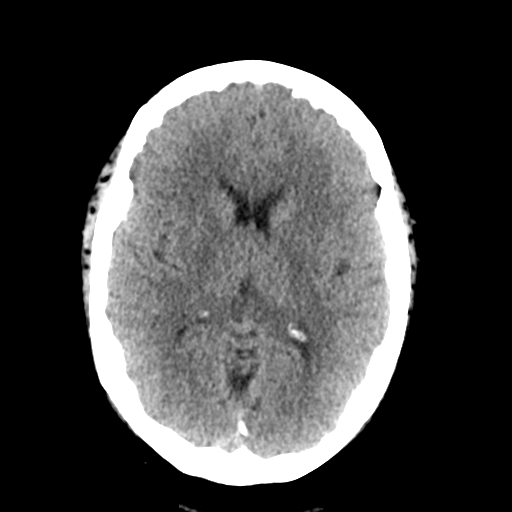
[im 14/31  bone]
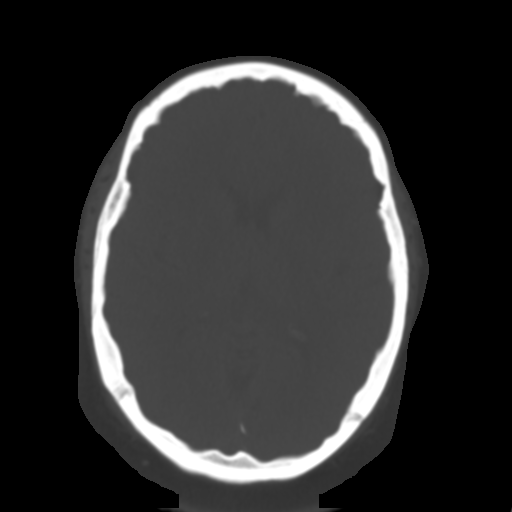
[im 17/31  brain]
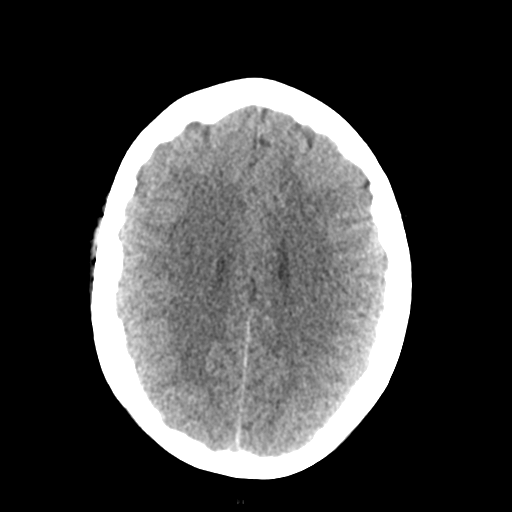
[im 20/31  brain]
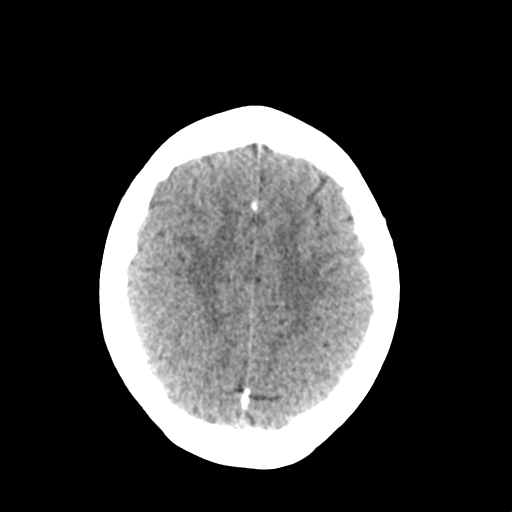
[im 23/31  brain]
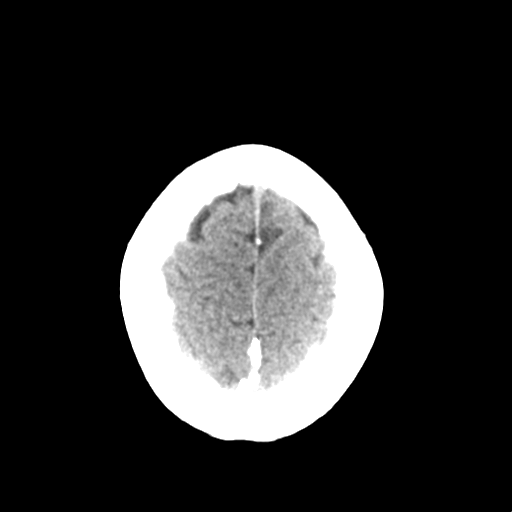
[im 25/31  brain]
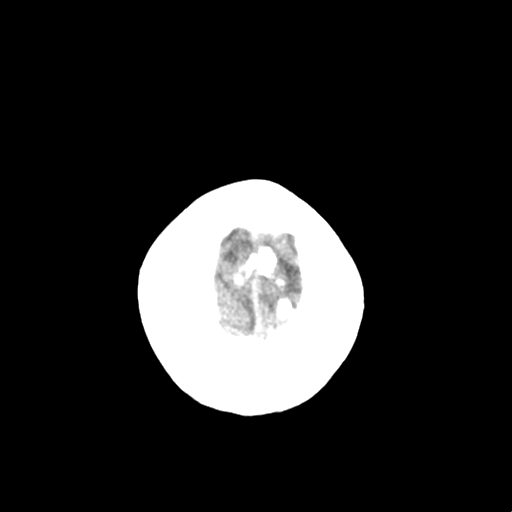
[im 25/31  bone]
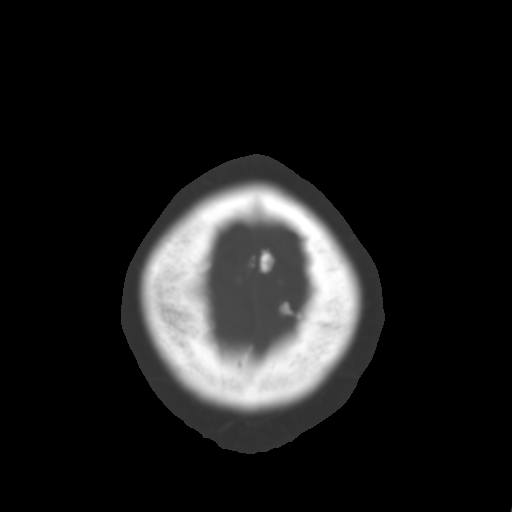
[im 28/31  brain]
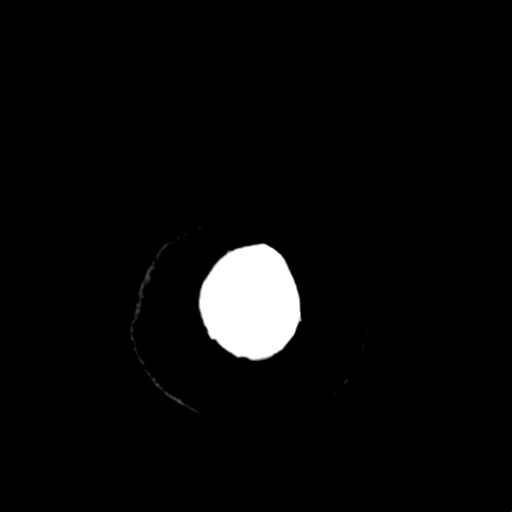

[Series 4: coronal soft tissue · coronal · 0.29mm/px · 3 of 67 slices shown]
[im 23/67  brain]
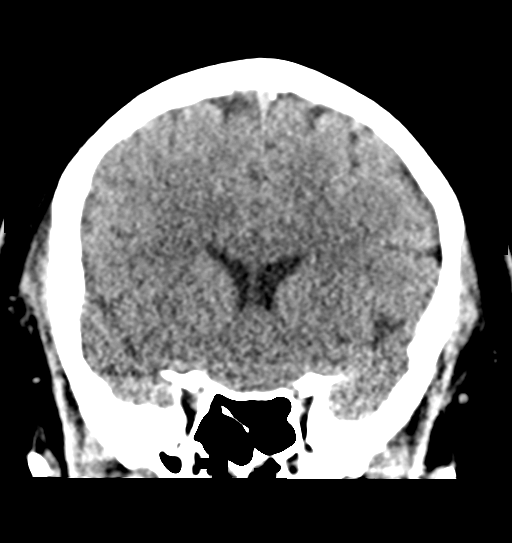
[im 30/67  brain]
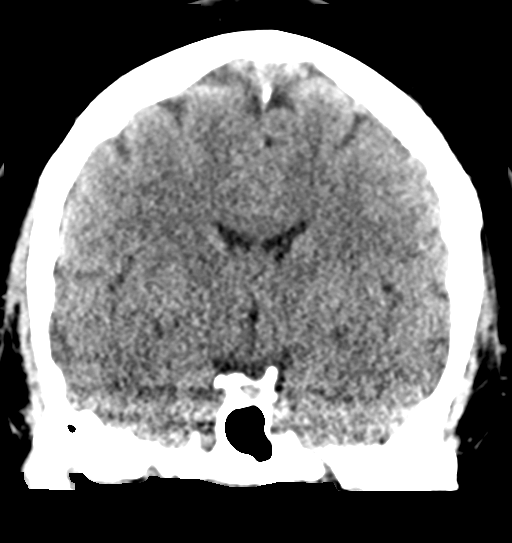
[im 37/67  brain]
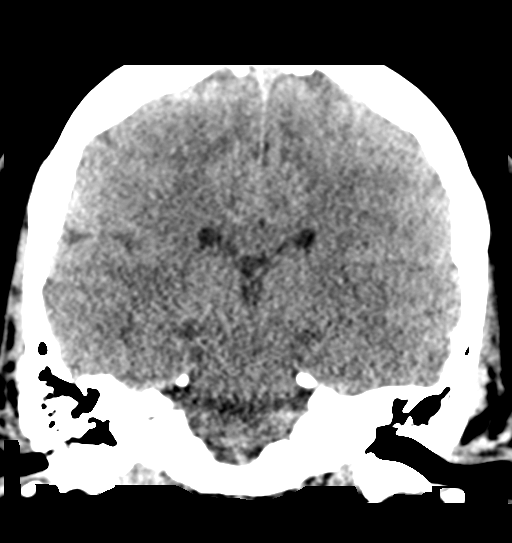

[Series 5: sagittal soft tissue · sagittal · 0.30mm/px · 3 of 56 slices shown]
[im 19/56  brain]
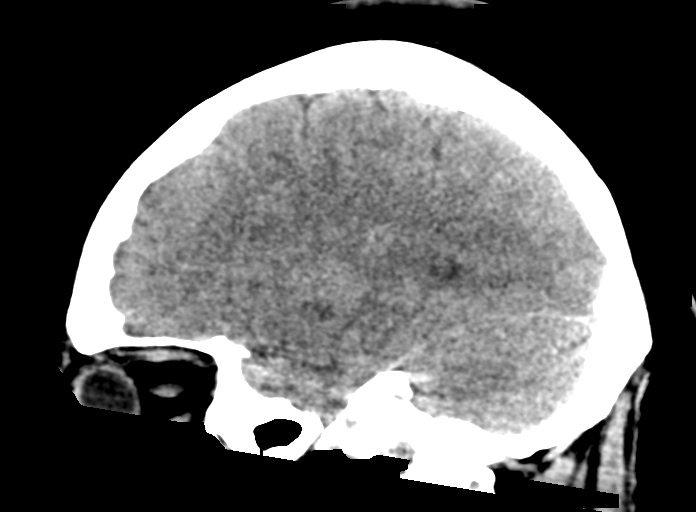
[im 28/56  brain]
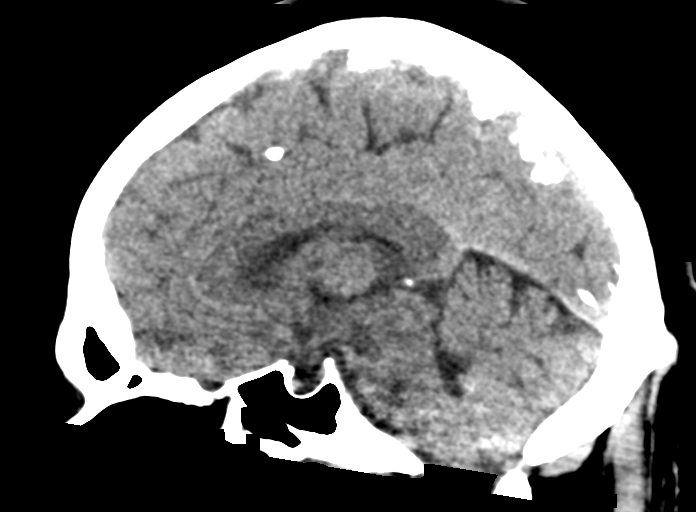
[im 37/56  brain]
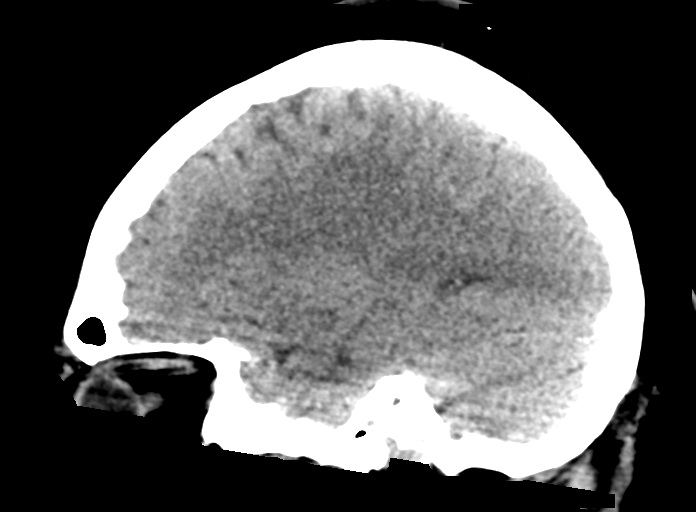

[16 of 47 positions shown; findings below may reference images not displayed]

FINDINGS: Brain: No acute infarct, hemorrhage, or mass lesion is present. The
ventricles are of normal size. No significant extraaxial fluid
collection is present.

Vascular: No hyperdense vessel or unexpected calcification.

Skull: Normal. Negative for fracture or focal lesion.

Sinuses/Orbits: The paranasal sinuses and mastoid air cells are
clear. The globes and orbits are intact.
IMPRESSION: Negative CT of the head.

## 2016-05-05 MED ORDER — KETOROLAC TROMETHAMINE 30 MG/ML IJ SOLN
15.0000 mg | Freq: Once | INTRAMUSCULAR | Status: AC
  Administered 2016-05-05: 15 mg via INTRAVENOUS
  Filled 2016-05-05: qty 1

## 2016-05-05 MED ORDER — DIPHENHYDRAMINE HCL 50 MG/ML IJ SOLN
12.5000 mg | Freq: Once | INTRAMUSCULAR | Status: AC
  Administered 2016-05-05: 12.5 mg via INTRAVENOUS
  Filled 2016-05-05: qty 1

## 2016-05-05 MED ORDER — LOSARTAN POTASSIUM 50 MG PO TABS
100.0000 mg | ORAL_TABLET | Freq: Every day | ORAL | Status: DC
  Administered 2016-05-05: 100 mg via ORAL
  Filled 2016-05-05 (×3): qty 2

## 2016-05-05 MED ORDER — PROCHLORPERAZINE EDISYLATE 5 MG/ML IJ SOLN
10.0000 mg | Freq: Once | INTRAMUSCULAR | Status: AC
  Administered 2016-05-05: 10 mg via INTRAVENOUS
  Filled 2016-05-05: qty 2

## 2016-05-05 MED ORDER — HYDROCODONE-ACETAMINOPHEN 5-325 MG PO TABS: 2.0000 | ORAL_TABLET | ORAL | 0 refills | Status: DC | PRN

## 2016-05-05 NOTE — ED Triage Notes (Signed)
Pt reports h/a since Friday with nausea.  Denies unilateral weakness, slurred speech.  Pt has sensitivity to light, alert and oriented at this time.

## 2016-05-05 NOTE — ED Provider Notes (Signed)
[facility]-EMERGENCY DEPT Provider Note   CSN: [id number] Arrival date & time: 05/05/16  0831     History   Chief Complaint Chief Complaint  Patient presents with  . Headache    HPI Kathryn Morris is a 40 y.o. female.  HPI Pt reports h/a since Friday with nausea.  Denies unilateral weakness, slurred speech.  Pt has sensitivity to light, alert and oriented at this time.  Past Medical History:  Diagnosis Date  . Anemia   . Diabetes mellitus without complication (HCC)   . Hypertension   . SVT (supraventricular tachycardia) [facility](HCC)     Patient Active Problem List   Diagnosis Date Noted  . Morbid obesity (HCC) 03/31/2015  . SVT (supraventricular tachycardia) (HCC) 03/26/2015  . DM2 (diabetes mellitus, type 2) (HCC) 03/26/2015  . HTN (hypertension) 03/26/2015    Past Surgical History:  Procedure Laterality Date  . ELECTROPHYSIOLOGIC STUDY N/A 01/01/2016   Procedure: SVT Ablation;  Surgeon: Hillis RangeJames Allred, MD;  [facility];  Service: Cardiovascular;  Laterality: N/A;  . NO PAST SURGERIES      OB History    Gravida Para Term Preterm AB Living   1       1 0   SAB TAB Ectopic Multiple Live Births   1               Home Medications    Prior to Admission medications   Medication Sig Start Date End Date Taking? Authorizing Provider  aspirin EC 81 MG tablet Take 81 mg by mouth daily.    Yes Historical Provider, MD  ferrous sulfate 325 (65 FE) MG tablet Take 325-650 mg by mouth 2 (two) times daily. Take 2 tablets by mouth in the AM and 1 tablet in the PM   Yes Historical Provider, MD  hydrochlorothiazide (HYDRODIURIL) 25 MG tablet Take 25 mg by mouth daily as needed (fluid).   Yes Historical Provider, MD  ibuprofen (ADVIL,MOTRIN) 200 MG tablet Take 400 mg by mouth every 6 (six) hours as needed for moderate pain.   Yes Historical Provider, MD  insulin aspart (NOVOLOG) 100 UNIT/ML injection Inject 5 Units into the skin 3 (three) times daily before meals. Per sliding  scale instructions   Yes Historical Provider, MD  insulin glargine (LANTUS) 100 UNIT/ML injection Inject 10-50 Units into the skin daily. Take 50 units in the morning and 10 units at bedtime.   Yes Historical Provider, MD  losartan (COZAAR) 100 MG tablet Take 1 tablet (100 mg total) by mouth daily. 12/24/15  Yes Hillis RangeJames Allred, MD  metFORMIN (GLUCOPHAGE) 1000 MG tablet Take 1,000 mg by mouth 2 (two) times daily with a meal.   Yes Historical Provider, MD  metoprolol succinate (TOPROL-XL) 50 MG 24 hr tablet Take 1 tablet (50 mg total) by mouth daily. Take with or immediately following a meal. 01/01/16  Yes Renee Norberto SorensonLynn Ursuy, PA-C  Multiple Vitamin (MULTIVITAMIN WITH MINERALS) TABS tablet Take 1 tablet by mouth daily.   Yes Historical Provider, MD  HYDROcodone-acetaminophen (NORCO/VICODIN) 5-325 MG tablet Take 2 tablets by mouth every 4 (four) hours as needed. 05/05/16   Nelva Nayobert Adelin Ventrella, MD    Family History Family History  Problem Relation Age of Onset  . Diabetes Mother   . Heart failure Mother   . Diabetes Sister     Social History Social History  Substance Use Topics  . Smoking status: Never Smoker  . Smokeless tobacco: Never Used  . Alcohol use No  Allergies   Lisinopril   Review of Systems Review of Systems  All other systems reviewed and are negative.    Physical Exam Updated Vital Signs BP 169/70   Pulse (!) 57   Temp 98.1 F (36.7 C) (Oral)   Resp 18   Ht 5\' 11"  (1.803 m)   Wt 292 lb (132.5 kg)   LMP 04/12/2016 (Exact Date)   SpO2 97%   BMI 40.73 kg/m   Physical Exam  Constitutional: She is oriented to person, place, and time. She appears well-developed and well-nourished. No distress.  HENT:  Head: Normocephalic and atraumatic.  Eyes: Pupils are equal, round, and reactive to light.  Neck: Normal range of motion. Neck supple.  Cardiovascular: Normal rate and intact distal pulses.   Pulmonary/Chest: No respiratory distress.  Abdominal: Normal appearance. She  exhibits no distension.  Musculoskeletal: Normal range of motion.  Neurological: She is alert and oriented to person, place, and time. She has normal strength. No cranial nerve deficit or sensory deficit. She displays a negative Romberg sign. GCS eye subscore is 4. GCS verbal subscore is 5. GCS motor subscore is 6.  Skin: Skin is warm and dry. No rash noted.  Psychiatric: She has a normal mood and affect. Her behavior is normal.  Nursing note and vitals reviewed.    ED Treatments / Results  Labs (all labs ordered are listed, but only abnormal results are displayed) Labs Reviewed  I-STAT CHEM 8, ED - Abnormal; Notable for the following:       Result Value   Glucose, Bld 103 (*)    Calcium, Ion 1.13 (*)    All other components within normal limits  CBG MONITORING, ED - Abnormal; Notable for the following:    Glucose-Capillary 108 (*)    All other components within normal limits    EKG  EKG Interpretation None       Radiology Ct Head Wo Contrast  Result Date: 05/05/2016 CLINICAL DATA:  New onset headaches for 3 days. Nausea. Initial encounter. EXAM: CT HEAD WITHOUT CONTRAST TECHNIQUE: Contiguous axial images were obtained from the base of the skull through the vertex without intravenous contrast. COMPARISON:  None. FINDINGS: Brain: No acute infarct, hemorrhage, or mass lesion is present. The ventricles are of normal size. No significant extraaxial fluid collection is present. Vascular: No hyperdense vessel or unexpected calcification. Skull: Normal. Negative for fracture or focal lesion. Sinuses/Orbits: The paranasal sinuses and mastoid air cells are clear. The globes and orbits are intact. IMPRESSION: Negative CT of the head. Electronically Signed   By: Marin Roberts M.D.   On: 05/05/2016 09:43    Procedures Procedures (including critical care time)  Medications Ordered in ED Medications  losartan (COZAAR) tablet 100 mg (100 mg Oral Given 05/05/16 0902)    prochlorperazine (COMPAZINE) injection 10 mg (10 mg Intravenous Given 05/05/16 0902)  diphenhydrAMINE (BENADRYL) injection 12.5 mg (12.5 mg Intravenous Given 05/05/16 0904)  ketorolac (TORADOL) 30 MG/ML injection 15 mg (15 mg Intravenous Given 05/05/16 0903)     Initial Impression / Assessment and Plan / ED Course  I have reviewed the triage vital signs and the nursing notes.  Pertinent labs & imaging results that were available during my care of the patient were reviewed by me and considered in my medical decision making (see chart for details).  Clinical Course      Final Clinical Impressions(s) / ED Diagnoses   Final diagnoses:  Nonintractable headache, unspecified chronicity pattern, unspecified headache type  New Prescriptions New Prescriptions   HYDROCODONE-ACETAMINOPHEN (NORCO/VICODIN) 5-325 MG TABLET    Take 2 tablets by mouth every 4 (four) hours as needed.     Nelva Nay, MD 05/05/16 1056

## 2016-06-12 ENCOUNTER — Ambulatory Visit: Payer: Self-pay | Admitting: Family Medicine

## 2016-06-12 DIAGNOSIS — Z0289 Encounter for other administrative examinations: Secondary | ICD-10-CM

## 2016-10-22 ENCOUNTER — Ambulatory Visit: Payer: 59 | Admitting: Internal Medicine

## 2016-12-17 ENCOUNTER — Ambulatory Visit: Payer: 59 | Admitting: Internal Medicine

## 2016-12-22 ENCOUNTER — Other Ambulatory Visit: Payer: Self-pay | Admitting: Internal Medicine

## 2017-01-27 ENCOUNTER — Other Ambulatory Visit: Payer: Self-pay | Admitting: Internal Medicine

## 2017-02-23 ENCOUNTER — Other Ambulatory Visit: Payer: Self-pay | Admitting: Internal Medicine

## 2017-05-27 ENCOUNTER — Emergency Department (HOSPITAL_COMMUNITY): Payer: 59

## 2017-05-27 ENCOUNTER — Encounter (HOSPITAL_COMMUNITY): Payer: Self-pay | Admitting: *Deleted

## 2017-05-27 ENCOUNTER — Emergency Department (HOSPITAL_COMMUNITY)
Admission: EM | Admit: 2017-05-27 | Discharge: 2017-05-27 | Disposition: A | Discharge status: Home / Self Care | Payer: 59 | Attending: Emergency Medicine | Admitting: Emergency Medicine

## 2017-05-27 ENCOUNTER — Other Ambulatory Visit: Payer: Self-pay

## 2017-05-27 DIAGNOSIS — Z7982 Long term (current) use of aspirin: Secondary | ICD-10-CM | POA: Insufficient documentation

## 2017-05-27 DIAGNOSIS — I160 Hypertensive urgency: Secondary | ICD-10-CM | POA: Insufficient documentation

## 2017-05-27 DIAGNOSIS — G43A0 Cyclical vomiting, not intractable: Secondary | ICD-10-CM | POA: Diagnosis not present

## 2017-05-27 DIAGNOSIS — Z79899 Other long term (current) drug therapy: Secondary | ICD-10-CM | POA: Diagnosis not present

## 2017-05-27 DIAGNOSIS — I10 Essential (primary) hypertension: Secondary | ICD-10-CM | POA: Insufficient documentation

## 2017-05-27 DIAGNOSIS — E119 Type 2 diabetes mellitus without complications: Secondary | ICD-10-CM | POA: Insufficient documentation

## 2017-05-27 DIAGNOSIS — R5383 Other fatigue: Secondary | ICD-10-CM | POA: Diagnosis not present

## 2017-05-27 DIAGNOSIS — R42 Dizziness and giddiness: Secondary | ICD-10-CM | POA: Diagnosis present

## 2017-05-27 DIAGNOSIS — Z794 Long term (current) use of insulin: Secondary | ICD-10-CM | POA: Insufficient documentation

## 2017-05-27 DIAGNOSIS — R1115 Cyclical vomiting syndrome unrelated to migraine: Secondary | ICD-10-CM

## 2017-05-27 LAB — BASIC METABOLIC PANEL
ANION GAP: 8 (ref 5–15)
BUN: 9 mg/dL (ref 6–20)
CALCIUM: 9.2 mg/dL (ref 8.9–10.3)
CHLORIDE: 99 mmol/L — AB (ref 101–111)
CO2: 27 mmol/L (ref 22–32)
Creatinine, Ser: 0.69 mg/dL (ref 0.44–1.00)
GFR calc non Af Amer: >60 mL/min (ref >60)
Glucose, Bld: 194 mg/dL — ABNORMAL HIGH (ref 65–99)
Potassium: 3.6 mmol/L (ref 3.5–5.1)
SODIUM: 134 mmol/L — AB (ref 135–145)

## 2017-05-27 LAB — I-STAT BETA HCG BLOOD, ED (MC, WL, AP ONLY): I-stat hCG, quantitative: <5 m[IU]/mL (ref <5)

## 2017-05-27 LAB — CBC WITH DIFFERENTIAL/PLATELET
BASOS PCT: 0 %
Basophils Absolute: 0 10*3/uL (ref 0.0–0.1)
EOS ABS: 0.2 10*3/uL (ref 0.0–0.7)
Eosinophils Relative: 3 %
HEMATOCRIT: 36.5 % (ref 36.0–46.0)
HEMOGLOBIN: 11.7 g/dL — AB (ref 12.0–15.0)
LYMPHS ABS: 2.2 10*3/uL (ref 0.7–4.0)
Lymphocytes Relative: 33 %
MCH: 29.9 pg (ref 26.0–34.0)
MCHC: 32.1 g/dL (ref 30.0–36.0)
MCV: 93.4 fL (ref 78.0–100.0)
Monocytes Absolute: 0.3 10*3/uL (ref 0.1–1.0)
Monocytes Relative: 5 %
NEUTROS ABS: 4 10*3/uL (ref 1.7–7.7)
NEUTROS PCT: 59 %
Platelets: 380 10*3/uL (ref 150–400)
RBC: 3.91 MIL/uL (ref 3.87–5.11)
RDW: 14.1 % (ref 11.5–15.5)
WBC: 6.7 10*3/uL (ref 4.0–10.5)

## 2017-05-27 LAB — URINALYSIS, ROUTINE W REFLEX MICROSCOPIC
BILIRUBIN URINE: NEGATIVE
Glucose, UA: 50 mg/dL — AB
HGB URINE DIPSTICK: NEGATIVE
KETONES UR: NEGATIVE mg/dL
Leukocytes, UA: NEGATIVE
NITRITE: NEGATIVE
PROTEIN: NEGATIVE mg/dL
SPECIFIC GRAVITY, URINE: 1.005 (ref 1.005–1.030)
pH: 7 (ref 5.0–8.0)

## 2017-05-27 LAB — I-STAT TROPONIN, ED: TROPONIN I, POC: 0 ng/mL (ref 0.00–0.08)

## 2017-05-27 LAB — CBG MONITORING, ED: Glucose-Capillary: 163 mg/dL — ABNORMAL HIGH (ref 65–99)

## 2017-05-27 IMAGING — CR DG CHEST 1V PORT
1 series · 1 of 1 positions shown · non-contrast
Comparison: Chest radiograph performed [DATE]

CLINICAL DATA: Acute onset of generalized chest pain.

EXAM:
PORTABLE CHEST 1 VIEW

[portable]
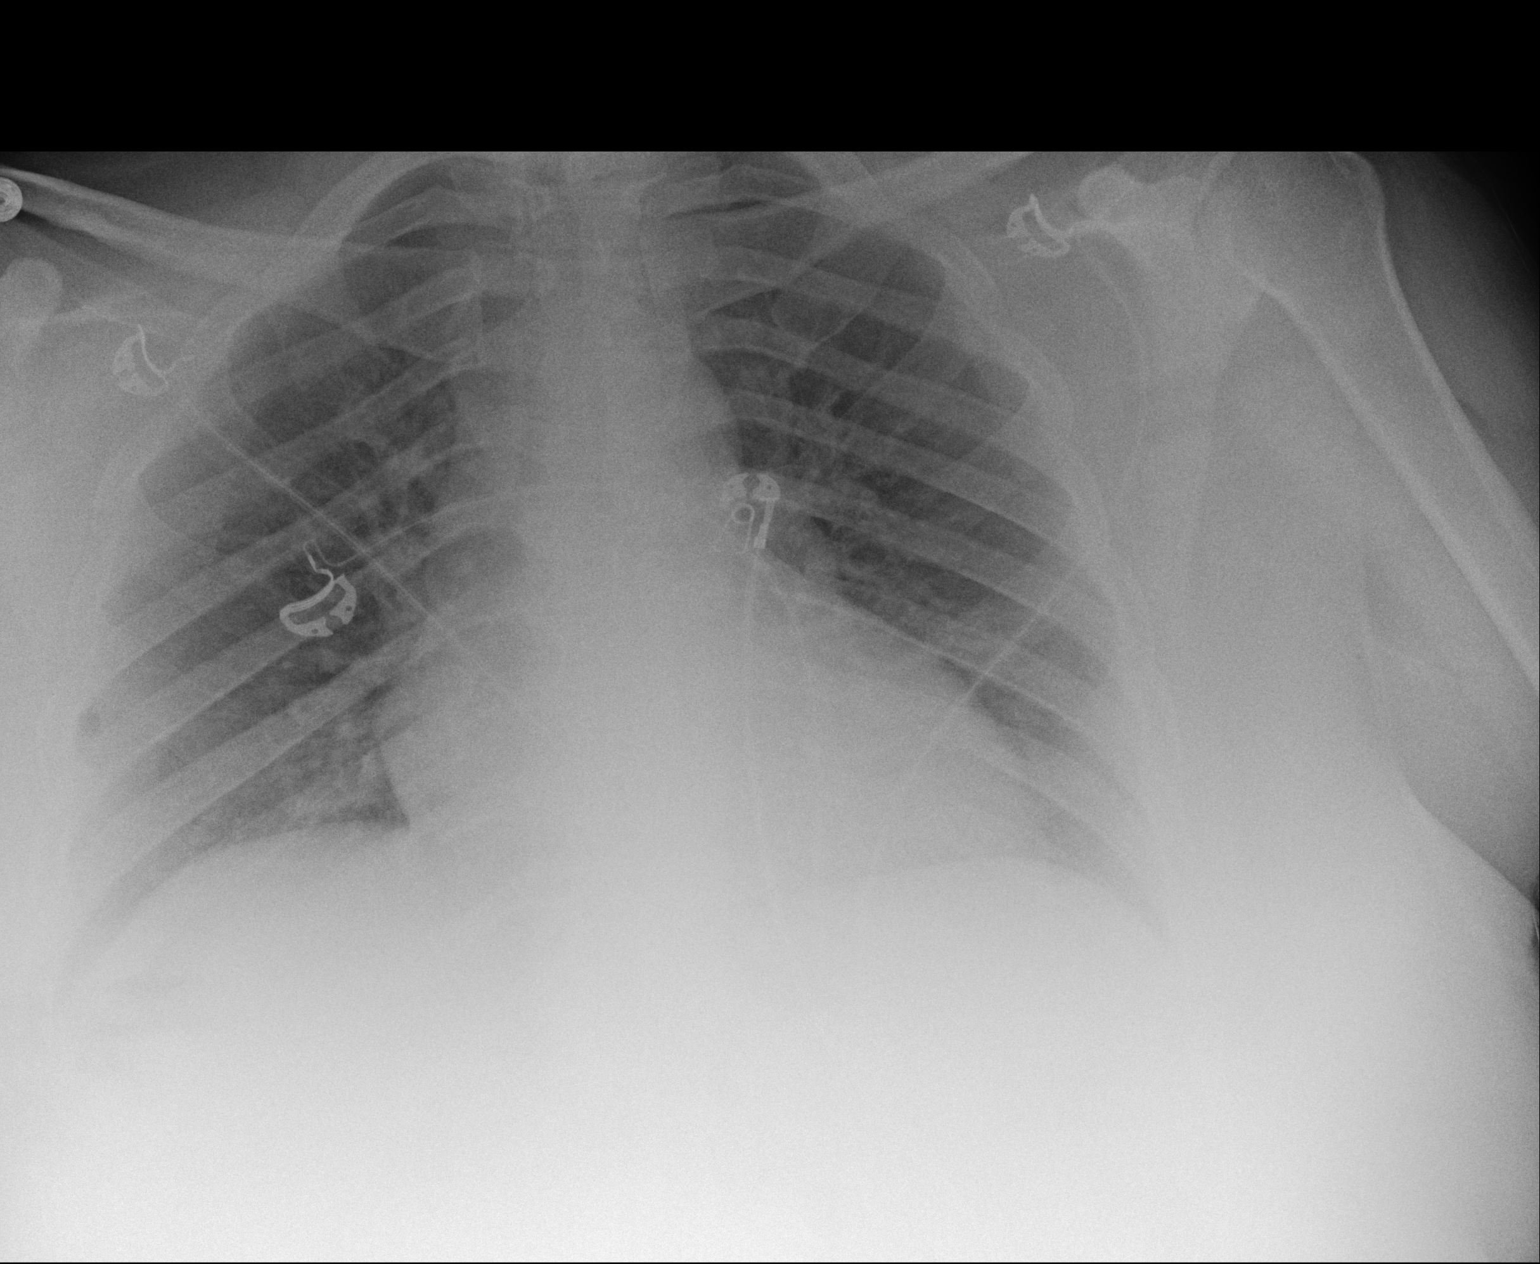

[1 of 1 positions shown; findings below may reference images not displayed]

FINDINGS: The lungs are well-aerated. Mild vascular congestion is noted. There
is no evidence of focal opacification, pleural effusion or
pneumothorax.

The cardiomediastinal silhouette is borderline enlarged. No acute
osseous abnormalities are seen.
IMPRESSION: Mild vascular congestion and borderline cardiomegaly. Lungs remain
grossly clear.

## 2017-05-27 MED ORDER — ONDANSETRON HCL 4 MG/2ML IJ SOLN
4.0000 mg | Freq: Once | INTRAMUSCULAR | Status: AC
  Administered 2017-05-27: 4 mg via INTRAVENOUS
  Filled 2017-05-27: qty 2

## 2017-05-27 NOTE — Discharge Instructions (Signed)

## 2017-05-27 NOTE — ED Provider Notes (Signed)
[facility] EMERGENCY DEPARTMENT Provider Note   CSN: [id number] Arrival date & time: 05/27/17  0547     History   Chief Complaint Chief Complaint  Patient presents with  . Hypertension    HPI Kathryn Morris is a 41 y.o. female.  The history is provided by the patient.  Illness  This is a new problem. Episode onset: just prior to arrival. The problem occurs constantly. The problem has not changed since onset.Pertinent negatives include no chest pain, no abdominal pain and no headaches. Nothing aggravates the symptoms. Nothing relieves the symptoms. Treatments tried: home blood pressure meds. The treatment provided no relief.  Patient reports waking up and "not feeling right" She reports having dizziness and nausea/vomiting No CP at this time No focal weakness No acute HA reported She her took her BP at homes and systolic >200 She reports med compliance and she actually took BP meds prior to presenting to the ER  She reports several days ago she had episodes of vomiting/diarrhea She also reports recent low back pain starting several days ago No acute back pain tonight  Past Medical History:  Diagnosis Date  . Anemia   . Diabetes mellitus without complication (HCC)   . Hypertension   . SVT (supraventricular tachycardia) [facility](HCC)     Patient Active Problem List   Diagnosis Date Noted  . Morbid obesity (HCC) 03/31/2015  . SVT (supraventricular tachycardia) (HCC) 03/26/2015  . DM2 (diabetes mellitus, type 2) (HCC) 03/26/2015  . HTN (hypertension) 03/26/2015    Past Surgical History:  Procedure Laterality Date  . NO PAST SURGERIES      OB History    Gravida Para Term Preterm AB Living   1       1 0   SAB TAB Ectopic Multiple Live Births   1               Home Medications    Prior to Admission medications   Medication Sig Start Date End Date Taking? Authorizing Provider  aspirin EC 81 MG tablet Take 81 mg by mouth daily.    Yes [provider]    ferrous sulfate 325 (65 FE) MG tablet Take 325-650 mg by mouth 2 (two) times daily. Take 2 tablets by mouth in the AM and 1 tablet in the PM   Yes [provider]  hydrochlorothiazide (HYDRODIURIL) 25 MG tablet Take 25 mg by mouth daily as needed (fluid).   Yes [provider]  ibuprofen (ADVIL,MOTRIN) 200 MG tablet Take 400 mg by mouth every 6 (six) hours as needed for moderate pain.   Yes [provider]  insulin glargine (LANTUS) 100 UNIT/ML injection Inject 10-50 Units into the skin daily. Take 50 units in the morning and 10 units at bedtime.   Yes [provider]  losartan (COZAAR) 100 MG tablet TAKE 1 TABLET BY MOUTH ONCE DAILY. **PLEASE SCHEDULE APPT FOR FURTHER REFILLS** 02/23/17  Yes Allred, Fayrene FearingJames, MD  metFORMIN (GLUCOPHAGE) 1000 MG tablet Take 1,000 mg by mouth 2 (two) times daily with a meal.   Yes [provider]  metoprolol succinate (TOPROL-XL) 50 MG 24 hr tablet TAKE 1 TABLET BY MOUTH ONCE DAILY 02/23/17  Yes Allred, Fayrene FearingJames, MD  Multiple Vitamin (MULTIVITAMIN WITH MINERALS) TABS tablet Take 1 tablet by mouth daily.   Yes [provider]  HYDROcodone-acetaminophen (NORCO/VICODIN) 5-325 MG tablet Take 2 tablets by mouth every 4 (four) hours as needed. 05/05/16   Nelva NayBeaton, Robert, MD  insulin aspart (NOVOLOG)  100 UNIT/ML injection Inject 5 Units into the skin 3 (three) times daily before meals. Per sliding scale instructions    [provider]    Family History Family History  Problem Relation Age of Onset  . Diabetes Mother   . Heart failure Mother   . Diabetes Sister     Social History Social History   Tobacco Use  . Smoking status: Never Smoker  . Smokeless tobacco: Never Used  Substance Use Topics  . Alcohol use: No  . Drug use: No     Allergies   Lisinopril   Review of Systems Review of Systems  Constitutional: Positive for fatigue. Negative for fever.  Eyes: Negative for visual disturbance.   Cardiovascular: Negative for chest pain.  Gastrointestinal: Positive for nausea and vomiting. Negative for abdominal pain.  Musculoskeletal: Positive for back pain.  Neurological: Positive for dizziness. Negative for headaches.  All other systems reviewed and are negative.    Physical Exam Updated Vital Signs BP (!) 177/75   Pulse 60   Temp 98.4 F (36.9 C) (Oral)   Resp 17   Ht 1.803 m (5\' 11" )   Wt 131.5 kg (290 lb)   LMP 05/19/2017   SpO2 97%   BMI 40.45 kg/m   Physical Exam CONSTITUTIONAL: Well developed/well nourished, no acute distress HEAD: Normocephalic/atraumatic EYES: EOMI/PERRL ENMT: Mucous membranes moist NECK: supple no meningeal signs SPINE/BACK:entire spine nontender Lumbar paraspinal tenderness CV: S1/S2 noted, no murmurs/rubs/gallops noted LUNGS: Lungs are clear to auscultation bilaterally, no apparent distress ABDOMEN: soft, nontender, no rebound or guarding, bowel sounds noted throughout abdomen, she is obese GU:no cva tenderness NEURO: Pt is awake/alert/appropriate, moves all extremitiesx4.  No facial droop.  No arm/leg drift EXTREMITIES: pulses normal/equalx4, full ROM SKIN: warm, color normal PSYCH: no abnormalities of mood noted, alert and oriented to situation   ED Treatments / Results  Labs (all labs ordered are listed, but only abnormal results are displayed) Labs Reviewed  BASIC METABOLIC PANEL - Abnormal; Notable for the following components:      Result Value   Sodium 134 (*)    Chloride 99 (*)    Glucose, Bld 194 (*)    All other components within normal limits  CBC WITH DIFFERENTIAL/PLATELET - Abnormal; Notable for the following components:   Hemoglobin 11.7 (*)    All other components within normal limits  URINALYSIS, ROUTINE W REFLEX MICROSCOPIC - Abnormal; Notable for the following components:   Color, Urine STRAW (*)    Glucose, UA 50 (*)    All other components within normal limits  CBG MONITORING, ED - Abnormal; Notable  for the following components:   Glucose-Capillary 163 (*)    All other components within normal limits  I-STAT TROPONIN, ED  I-STAT BETA HCG BLOOD, ED ([facility], [facility], [facility] ONLY)    EKG  EKG Interpretation  Date/Time:  Wednesday May 27 2017 06:02:31 EST Ventricular Rate:  73 PR Interval:    QRS Duration: 87 QT Interval:  409 QTC Calculation: 451 R Axis:   2 Text Interpretation:  Sinus rhythm Left ventricular hypertrophy Interpretation limited secondary to artifact Confirmed by Zadie RhineWickline, Ryle Buscemi ([id number]) on 05/27/2017 6:41:29 AM       Radiology Dg Chest Portable 1 View  Result Date: 05/27/2017 CLINICAL DATA:  Acute onset of generalized chest pain. EXAM: PORTABLE CHEST 1 VIEW COMPARISON:  Chest radiograph performed 11/18/2015 FINDINGS: The lungs are well-aerated. Mild vascular congestion is noted. There is no evidence of focal opacification, pleural effusion or pneumothorax. The  cardiomediastinal silhouette is borderline enlarged. No acute osseous abnormalities are seen. IMPRESSION: Mild vascular congestion and borderline cardiomegaly. Lungs remain grossly clear. Electronically Signed   By: Roanna Raider M.D.   On: 05/27/2017 06:30    Procedures Procedures    Medications Ordered in ED Medications  ondansetron (ZOFRAN) injection 4 mg (4 mg Intravenous Given 05/27/17 1610)     Initial Impression / Assessment and Plan / ED Course  I have reviewed the triage vital signs and the nursing notes.  Pertinent labs & imaging results that were available during my care of the patient were reviewed by me and considered in my medical decision making (see chart for details).     6:45 AM Pt with multiple vague symptoms with elevated BP It is now improved zofran given for nausea Labs pending at this time 7:08 AM Labs reassuring Pt improved BP improved She is ambulatory She denies any CP No focal weakness Reports musculoskeletal back pain but no other complaints  I offered further  monitoring in the ED with repeat troponin in case this represents atypical anginal symptoms Pt would prefer to go home  She now thinks she vomited BP meds from yesterday and that could have triggered her hypertensive episode  We discussed strict ER return precautions Overall pt appears improved   Final Clinical Impressions(s) / ED Diagnoses   Final diagnoses:  Hypertensive urgency  Non-intractable cyclical vomiting with nausea  Other fatigue    ED Discharge Orders    None       Zadie Rhine, MD 05/27/17 [phone]

## 2017-05-27 NOTE — ED Triage Notes (Signed)
Pt states she woke this morning not feeling right. Took blood pressure & was over 200. nauseated & vomited x1. Pt says she feels dizzy also.

## 2017-11-12 ENCOUNTER — Other Ambulatory Visit: Payer: Self-pay

## 2017-11-12 ENCOUNTER — Ambulatory Visit
Admission: EM | Admit: 2017-11-12 | Discharge: 2017-11-12 | Disposition: A | Discharge status: Home / Self Care | Payer: BLUE CROSS/BLUE SHIELD | Attending: Family Medicine | Admitting: Family Medicine

## 2017-11-12 DIAGNOSIS — J309 Allergic rhinitis, unspecified: Secondary | ICD-10-CM

## 2017-11-12 MED ORDER — FLUTICASONE PROPIONATE 50 MCG/ACT NA SUSP: 2.0000 | Freq: Every day | NASAL | 0 refills | Status: DC

## 2017-11-12 NOTE — ED Provider Notes (Signed)
[facility]    CSN: [id number] Arrival date & time: 11/12/17  0820  History   Chief Complaint Chief Complaint  Patient presents with  . Nasal Congestion   HPI  42 year old female presents with upper respiratory symptoms.  Patient states that she thinks that she is having issues with allergies.  She reports a "stuffy head".  She also reports watery eyes and sneezing.  She is been using over-the-counter allergy medication with some improvement.  She states that her work told her she needed to be evaluated.  She has had no fever.  No chills.  No other associated symptoms.  No known exacerbating factors.  No other complaints.  Past Medical History:  Diagnosis Date  . Anemia   . Diabetes mellitus without complication (HCC)   . Hypertension   . SVT (supraventricular tachycardia) [facility])     Patient Active Problem List   Diagnosis Date Noted  . Morbid obesity (HCC) 03/31/2015  . SVT (supraventricular tachycardia) (HCC) 03/26/2015  . DM2 (diabetes mellitus, type 2) (HCC) 03/26/2015  . HTN (hypertension) 03/26/2015    Past Surgical History:  Procedure Laterality Date  . ELECTROPHYSIOLOGIC STUDY N/A 01/01/2016   Procedure: SVT Ablation;  Surgeon: [name], MD;  Location: [facility] INVASIVE CV LAB;  Service: Cardiovascular;  Laterality: N/A;  . NO PAST SURGERIES      OB History    Gravida  1   Para      Term      Preterm      AB  1   Living  0     SAB  1   TAB      Ectopic      Multiple      Live Births             Home Medications    Prior to Admission medications   Medication Sig Start Date End Date Taking? Authorizing Provider  aspirin EC 81 MG tablet Take 81 mg by mouth daily.    Yes [provider]  Cyanocobalamin (VITAMIN B 12) 250 MCG LOZG Take by mouth.   Yes [provider]  ferrous sulfate 325 (65 FE) MG tablet Take 325-650 mg by mouth 2 (two) times daily. Take 2 tablets by mouth in the AM and 1 tablet in the PM   Yes  [provider]  folic acid (FOLVITE) 400 MCG tablet Take 400 mcg by mouth daily.   Yes [provider]  hydrochlorothiazide (HYDRODIURIL) 25 MG tablet Take 25 mg by mouth daily as needed (fluid).   Yes [provider]  losartan (COZAAR) 100 MG tablet TAKE 1 TABLET BY MOUTH ONCE DAILY. **PLEASE SCHEDULE APPT FOR FURTHER REFILLS** 02/23/17  Yes Allred, Fayrene Fearing, MD  metFORMIN (GLUCOPHAGE) 1000 MG tablet Take 1,000 mg by mouth 2 (two) times daily with a meal.   Yes [provider]  metoprolol succinate (TOPROL-XL) 50 MG 24 hr tablet TAKE 1 TABLET BY MOUTH ONCE DAILY 02/23/17  Yes Allred, Fayrene Fearing, MD  Multiple Vitamin (MULTIVITAMIN WITH MINERALS) TABS tablet Take 1 tablet by mouth daily.   Yes [provider]  NOVOLIN 70/30 RELION (70-30) 100 UNIT/ML injection  11/09/17  Yes [provider]  fluticasone (FLONASE) 50 MCG/ACT nasal spray Place 2 sprays into both nostrils daily. 11/12/17   Tommie Sams, DO    Family History Family History  Problem Relation Age of Onset  . Diabetes Mother   . Heart failure Mother   . Diabetes Sister  Social History Social History   Tobacco Use  . Smoking status: Never Smoker  . Smokeless tobacco: Never Used  Substance Use Topics  . Alcohol use: No  . Drug use: No     Allergies   Lisinopril   Review of Systems Review of Systems  Constitutional: Negative.   HENT: Positive for congestion and sneezing.    Physical Exam Triage Vital Signs ED Triage Vitals  Enc Vitals Group     BP 11/12/17 0837 (!) 165/77     Pulse Rate 11/12/17 0837 84     Resp 11/12/17 0837 17     Temp 11/12/17 0837 98.6 F (37 C)     Temp Source 11/12/17 0837 Oral     SpO2 11/12/17 0837 99 %     Weight 11/12/17 0834 280 lb (127 kg)     Height 11/12/17 0834  (1.803 m)     Head Circumference --      Peak Flow --      Pain Score 11/12/17 0834 0     Pain Loc --      Pain Edu? --      Excl. in GC? --    Updated Vital  Signs BP (!) 165/77 (BP Location: Left Arm)   Pulse 84   Temp 98.6 F (37 C) (Oral)   Resp 17   Ht  (1.803 m)   Wt 280 lb (127 kg)   LMP 10/17/2017   SpO2 99%   BMI 39.05 kg/m    Physical Exam  Constitutional: She is oriented to person, place, and time. She appears well-developed. No distress.  HENT:  Head: Normocephalic and atraumatic.  Mouth/Throat: Oropharynx is clear and moist.  Eyes: Conjunctivae are normal. Right eye exhibits no discharge. Left eye exhibits no discharge.  Neck: Neck supple.  Cardiovascular: Normal rate and regular rhythm.  Pulmonary/Chest: Effort normal and breath sounds normal. She has no wheezes. She has no rales.  Lymphadenopathy:    She has no cervical adenopathy.  Neurological: She is alert and oriented to person, place, and time.  Psychiatric: She has a normal mood and affect. Her behavior is normal.  Nursing note and vitals reviewed.  UC Treatments / Results  Labs (all labs ordered are listed, but only abnormal results are displayed) Labs Reviewed - No data to display  EKG None  Radiology No results found.  Procedures Procedures (including critical care time)  Medications Ordered in UC Medications - No data to display  Initial Impression / Assessment and Plan / UC Course  I have reviewed the triage vital signs and the nursing notes.  Pertinent labs & imaging results that were available during my care of the patient were reviewed by me and considered in my medical decision making (see chart for details).    42 year old female presents with allergic rhinitis.  Treating with Flonase.  Supportive care.  Final Clinical Impressions(s) / UC Diagnoses   Final diagnoses:  Allergic rhinitis, unspecified seasonality, unspecified trigger   ED Prescriptions    Medication Sig Dispense Auth. Provider   fluticasone (FLONASE) 50 MCG/ACT nasal spray Place 2 sprays into both nostrils daily. 16 g Tommie Sams, DO     Controlled Substance  Prescriptions [place] Controlled Substance Registry consulted? Not Applicable   Tommie Sams, [place] 11/12/17 [phone]

## 2017-11-12 NOTE — ED Triage Notes (Signed)
Patient states reports that she has been having nasal congestion, headaches, fevers, dry cough x Tuesday.

## 2018-07-23 ENCOUNTER — Other Ambulatory Visit: Payer: Self-pay

## 2018-07-23 ENCOUNTER — Emergency Department (HOSPITAL_COMMUNITY)
Admission: EM | Admit: 2018-07-23 | Discharge: 2018-07-23 | Disposition: A | Discharge status: Home / Self Care | Payer: Self-pay | Attending: Emergency Medicine | Admitting: Emergency Medicine

## 2018-07-23 ENCOUNTER — Emergency Department (HOSPITAL_COMMUNITY): Payer: Self-pay

## 2018-07-23 ENCOUNTER — Encounter (HOSPITAL_COMMUNITY): Payer: Self-pay | Admitting: Emergency Medicine

## 2018-07-23 DIAGNOSIS — I10 Essential (primary) hypertension: Secondary | ICD-10-CM | POA: Insufficient documentation

## 2018-07-23 DIAGNOSIS — E119 Type 2 diabetes mellitus without complications: Secondary | ICD-10-CM | POA: Insufficient documentation

## 2018-07-23 DIAGNOSIS — B9789 Other viral agents as the cause of diseases classified elsewhere: Secondary | ICD-10-CM

## 2018-07-23 DIAGNOSIS — Z79899 Other long term (current) drug therapy: Secondary | ICD-10-CM | POA: Insufficient documentation

## 2018-07-23 DIAGNOSIS — J069 Acute upper respiratory infection, unspecified: Secondary | ICD-10-CM | POA: Insufficient documentation

## 2018-07-23 IMAGING — DX DG CHEST 2V
2 series · 2 of 2 positions shown · non-contrast
Comparison: Radiograph [DATE].

CLINICAL DATA: Productive cough, shortness of breath.

EXAM:
CHEST - 2 VIEW

[chest pa]
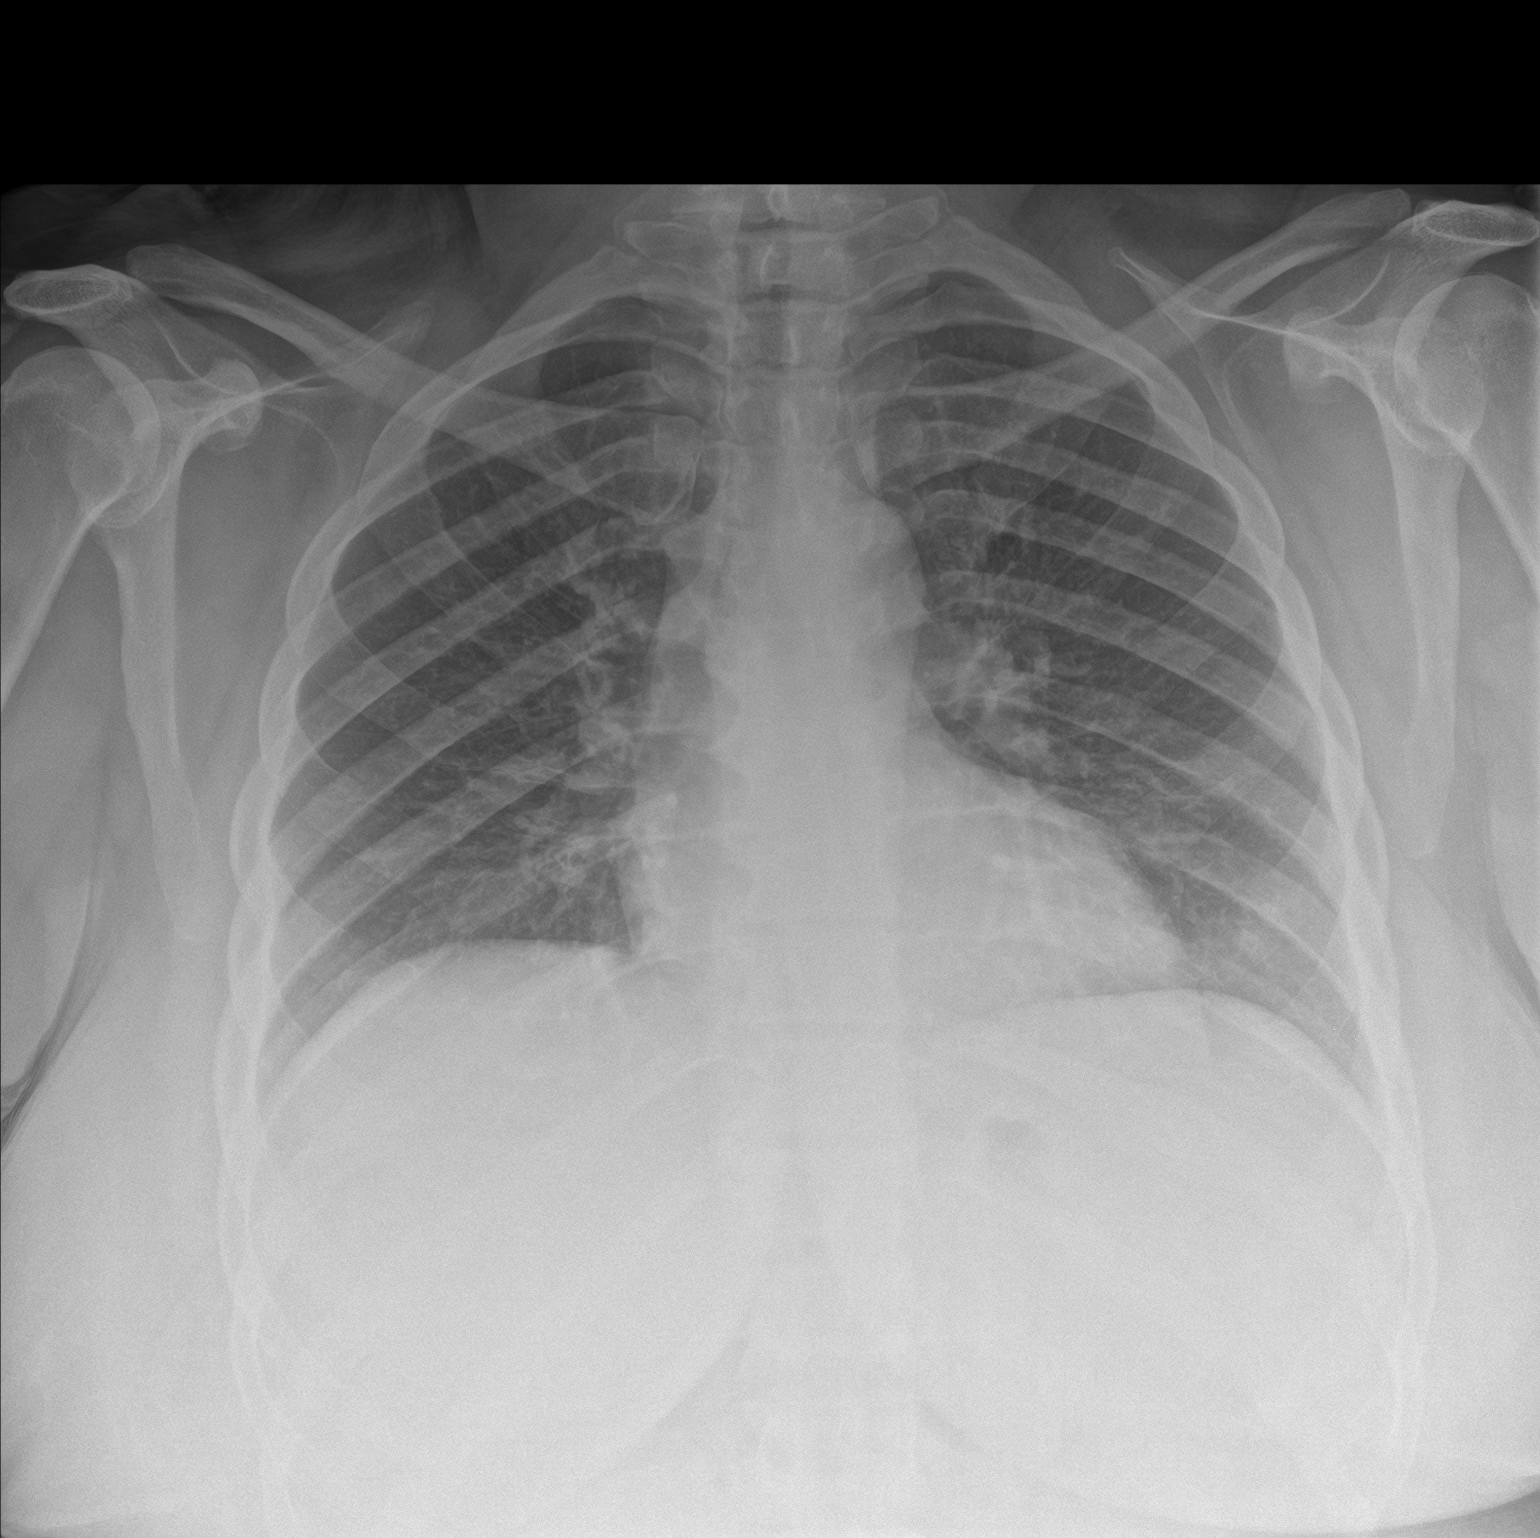

[chest lat]
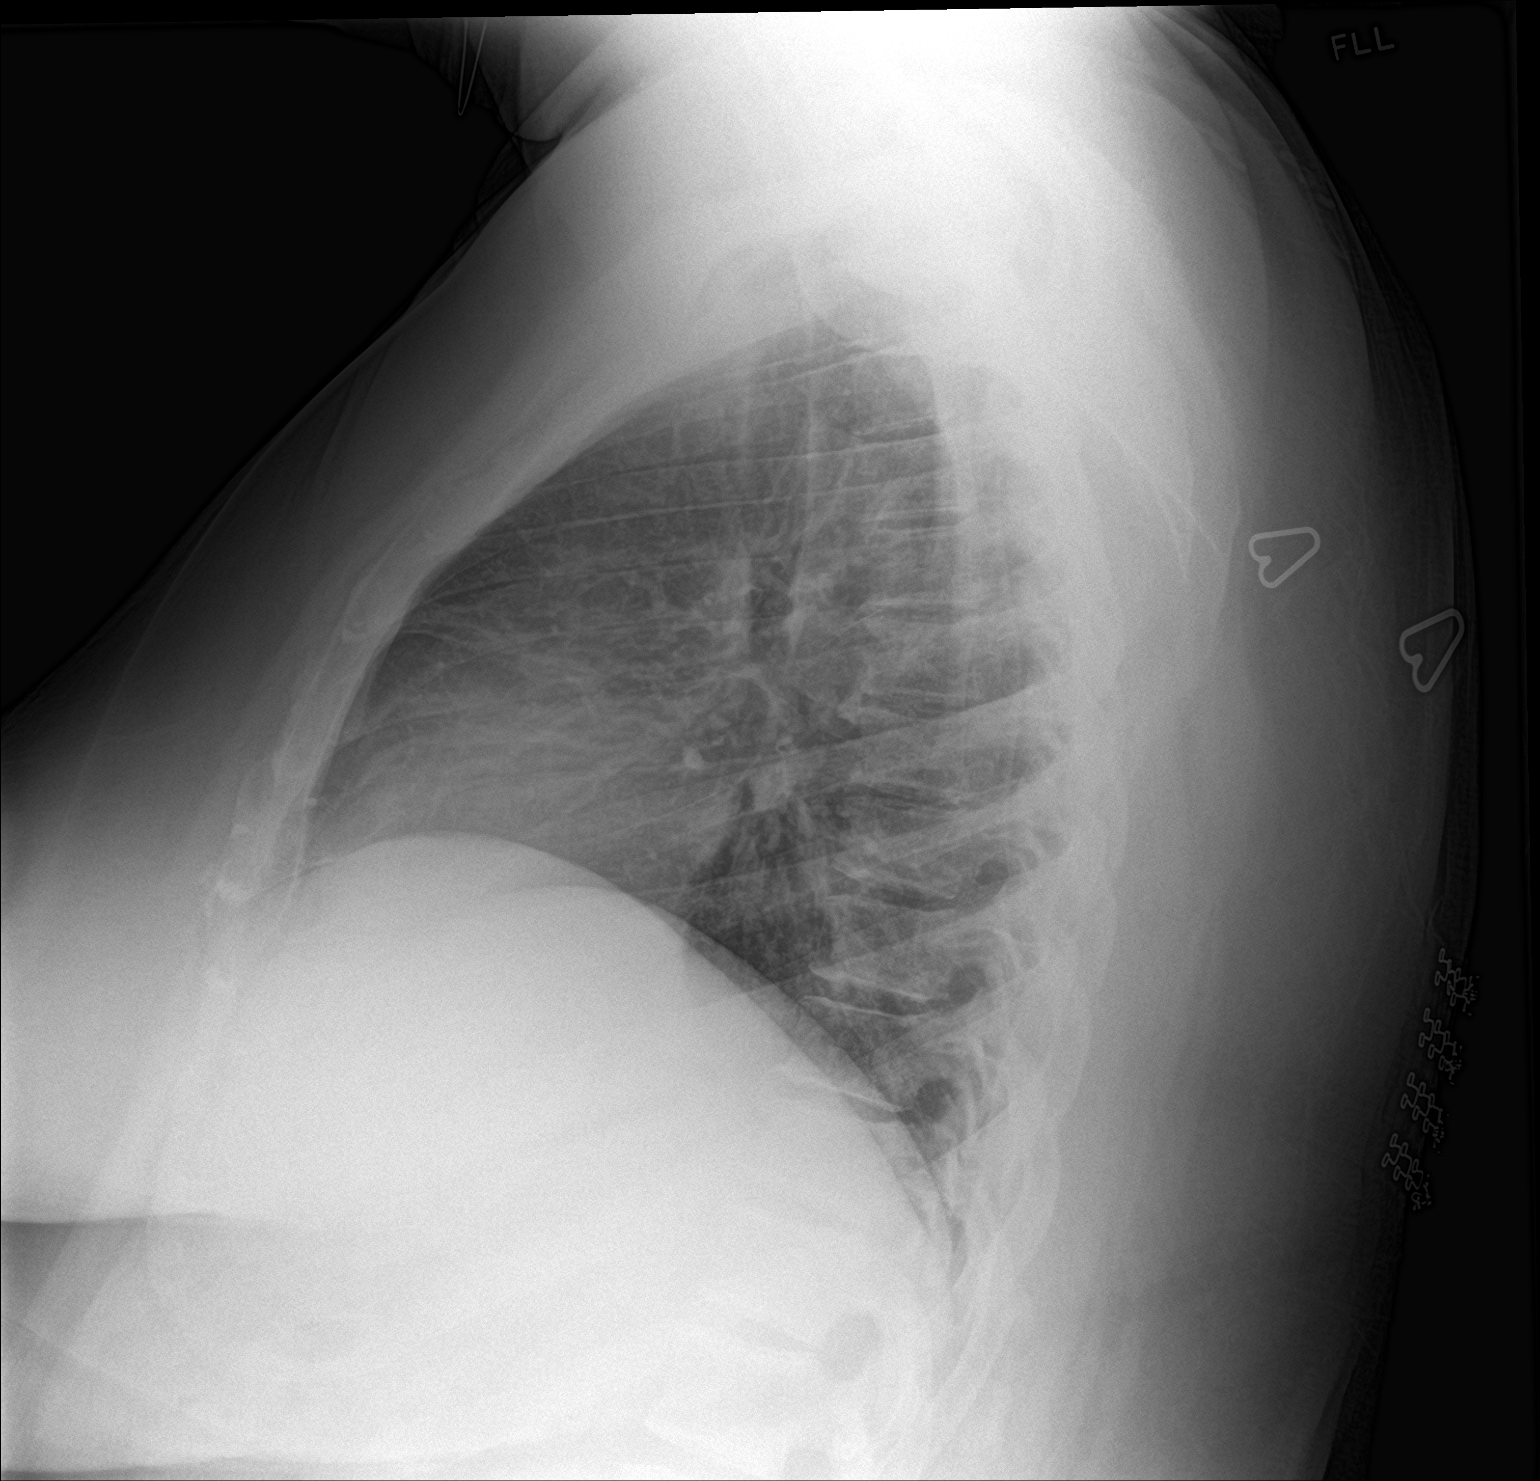

[2 of 2 positions shown; findings below may reference images not displayed]

FINDINGS: The heart size and mediastinal contours are within normal limits.
Both lungs are clear. No pneumothorax or pleural effusion is noted.
The visualized skeletal structures are unremarkable.
IMPRESSION: No active cardiopulmonary disease.

## 2018-07-23 MED ORDER — DEXAMETHASONE 4 MG PO TABS: 4.0000 mg | ORAL_TABLET | Freq: Two times a day (BID) | ORAL | 0 refills | Status: DC

## 2018-07-23 MED ORDER — HYDROCODONE-HOMATROPINE 5-1.5 MG/5ML PO SYRP: 5.0000 mL | ORAL_SOLUTION | Freq: Four times a day (QID) | ORAL | 0 refills | Status: DC | PRN

## 2018-07-23 NOTE — Discharge Instructions (Addendum)
Your chest x-ray is negative for pneumonia or other acute changes.  Your examination favors an upper respiratory infection with cough.  Please continue the Coricidin.  Please add Decadron 2 times daily and Hycodan for cough every 6 hours as needed for cough and congestion. Please increase fluids.  Please wash hands frequently.  Please see your primary physician for recheck of your blood pressure which was elevated at 172/86, and follow-up of your upper respiratory infection.  Return to the emergency department if any changes in your condition, problems, or concerns.  The Hycodan for cough may cause drowsiness, please do not drive a vehicle, operate machinery, or participate in activities requiring concentration when taking this medication.

## 2018-07-23 NOTE — ED Triage Notes (Signed)
Patient complaining of cough and congestion "for weeks." States she is coughing up green sputum.

## 2018-07-23 NOTE — ED Provider Notes (Signed)
[facility] EMERGENCY DEPARTMENT Provider Note   CSN: [id number] Arrival date & time: 07/23/18  7353     History   Chief Complaint Chief Complaint  Patient presents with  . Cough    HPI Kathryn Morris is a 43 y.o. female.  The history is provided by the patient.  Cough  Cough characteristics:  Productive Sputum characteristics:  Green Severity:  Moderate Onset quality:  Gradual Timing:  Intermittent Progression:  Worsening Chronicity:  New Smoker: no   Context: sick contacts, upper respiratory infection and weather changes   Relieved by:  Nothing Worsened by:  Lying down Associated symptoms: chills, fever, headaches, myalgias, sinus congestion and wheezing   Associated symptoms: no chest pain, no diaphoresis, no eye discharge and no shortness of breath   Risk factors: no recent travel     Past Medical History:  Diagnosis Date  . Anemia   . Diabetes mellitus without complication (HCC)   . Hypertension   . SVT (supraventricular tachycardia) [facility])     Patient Active Problem List   Diagnosis Date Noted  . Morbid obesity (HCC) 03/31/2015  . SVT (supraventricular tachycardia) (HCC) 03/26/2015  . DM2 (diabetes mellitus, type 2) (HCC) 03/26/2015  . HTN (hypertension) 03/26/2015    Past Surgical History:  Procedure Laterality Date  . ELECTROPHYSIOLOGIC STUDY N/A 01/01/2016   Procedure: SVT Ablation;  Surgeon: [name], MD;  Location: [facility] INVASIVE CV LAB;  Service: Cardiovascular;  Laterality: N/A;  . NO PAST SURGERIES       OB History    Gravida  1   Para      Term      Preterm      AB  1   Living  0     SAB  1   TAB      Ectopic      Multiple      Live Births               Home Medications    Prior to Admission medications   Medication Sig Start Date End Date Taking? Authorizing Provider  aspirin EC 81 MG tablet Take 81 mg by mouth daily.     [provider]  Cyanocobalamin (VITAMIN B 12) 250 MCG LOZG Take by mouth.     [provider]  ferrous sulfate 325 (65 FE) MG tablet Take 325-650 mg by mouth 2 (two) times daily. Take 2 tablets by mouth in the AM and 1 tablet in the PM    [provider]  fluticasone (FLONASE) 50 MCG/ACT nasal spray Place 2 sprays into both nostrils daily. 11/12/17   Tommie Sams, DO  folic acid (FOLVITE) 400 MCG tablet Take 400 mcg by mouth daily.    [provider]  hydrochlorothiazide (HYDRODIURIL) 25 MG tablet Take 25 mg by mouth daily as needed (fluid).    [provider]  losartan (COZAAR) 100 MG tablet TAKE 1 TABLET BY MOUTH ONCE DAILY. **PLEASE SCHEDULE APPT FOR FURTHER REFILLS** 02/23/17   Allred, Fayrene Fearing, MD  metFORMIN (GLUCOPHAGE) 1000 MG tablet Take 1,000 mg by mouth 2 (two) times daily with a meal.    [provider]  metoprolol succinate (TOPROL-XL) 50 MG 24 hr tablet TAKE 1 TABLET BY MOUTH ONCE DAILY 02/23/17   Allred, Fayrene Fearing, MD  Multiple Vitamin (MULTIVITAMIN WITH MINERALS) TABS tablet Take 1 tablet by mouth daily.    [provider]  NOVOLIN 70/30 RELION (70-30) 100 UNIT/ML injection  11/09/17   [provider]    Family History Family History  Problem Relation Age of Onset  . Diabetes Mother   . Morris failure Mother   . Diabetes Sister     Social History Social History   Tobacco Use  . Smoking status: Never Smoker  . Smokeless tobacco: Never Used  Substance Use Topics  . Alcohol use: No  . Drug use: No     Allergies   Lisinopril   Review of Systems Review of Systems  Constitutional: Positive for chills and fever. Negative for activity change and diaphoresis.       All ROS Neg except as noted in HPI  HENT: Negative for nosebleeds.   Eyes: Negative for photophobia and discharge.  Respiratory: Positive for cough and wheezing. Negative for shortness of breath.   Cardiovascular: Positive for palpitations. Negative for chest pain.  Gastrointestinal: Negative for abdominal pain and blood in stool.    Genitourinary: Negative for dysuria, frequency and hematuria.  Musculoskeletal: Positive for myalgias. Negative for arthralgias, back pain and neck pain.  Skin: Negative.   Neurological: Positive for headaches. Negative for dizziness, seizures and speech difficulty.  Psychiatric/Behavioral: Negative for confusion and hallucinations.     Physical Exam Updated Vital Signs BP (!) 172/86 (BP Location: Right Arm)   Pulse 98   Temp 97.7 F (36.5 C) (Oral)   Resp 20   Ht 5\' 11"  (1.803 m)   Wt 117.9 kg   LMP 07/06/2018   SpO2 99%   BMI 36.26 kg/m   Physical Exam Vitals signs and nursing note reviewed.  Constitutional:      Appearance: She is well-developed. She is not toxic-appearing.  HENT:     Head: Normocephalic.     Right Ear: Tympanic membrane and external ear normal.     Left Ear: Tympanic membrane and external ear normal.     Nose: Congestion present.  Eyes:     General: Lids are normal.     Pupils: Pupils are equal, round, and reactive to light.  Neck:     Musculoskeletal: Normal range of motion and neck supple.     Vascular: No carotid bruit.  Cardiovascular:     Rate and Rhythm: Normal rate and regular rhythm.     Pulses: Normal pulses.     Morris sounds: Normal Morris sounds.  Pulmonary:     Effort: No respiratory distress.     Breath sounds: Rhonchi present.     Comments: Symmetrical rise and fall of the chest. Course breath sounds noted. Frequent cough during exam. Abdominal:     General: Bowel sounds are normal.     Palpations: Abdomen is soft.     Tenderness: There is no abdominal tenderness. There is no guarding.  Musculoskeletal: Normal range of motion.  Lymphadenopathy:     Head:     Right side of head: No submandibular adenopathy.     Left side of head: No submandibular adenopathy.     Cervical: No cervical adenopathy.  Skin:    General: Skin is warm and dry.  Neurological:     Mental Status: She is alert and oriented to person, place, and time.      Cranial Nerves: No cranial nerve deficit.     Sensory: No sensory deficit.  Psychiatric:        Speech: Speech normal.      ED Treatments / Results  Labs (all labs ordered are listed, but only abnormal results are displayed) Labs Reviewed - No data to display  EKG None  Radiology Dg Chest 2 View  Result Date: 07/23/2018 CLINICAL DATA:  Productive cough, shortness of breath. EXAM: CHEST - 2 VIEW COMPARISON:  Radiograph May 27, 2017. FINDINGS: The Morris size and mediastinal contours are within normal limits. Both lungs are clear. No pneumothorax or pleural effusion is noted. The visualized skeletal structures are unremarkable. IMPRESSION: No active cardiopulmonary disease. Electronically Signed   By: Lupita RaiderJames  Green Jr, M.D.   On: 07/23/2018 09:18    Procedures Procedures (including critical care time)  Medications Ordered in ED Medications - No data to display   Initial Impression / Assessment and Plan / ED Course  I have reviewed the triage vital signs and the nursing notes.  Pertinent labs & imaging results that were available during my care of the patient were reviewed by me and considered in my medical decision making (see chart for details).       Final Clinical Impressions(s) / ED Diagnoses mdm  Vital signs reviewed.  Blood pressure is elevated at 172/86, the remainder the vital signs within normal limits.  Pulse oximetry is 99% on room air.  Within normal limits by my interpretation.  Patient speaks in complete sentences.  She has a frequent cough during the examination.  The chest x-ray is negative for acute changes or problems.  The examination is consistent with upper respiratory infection with cough.  Patient is treated with continuation of her Coricidin, addition of short course steroid and cough medication.  Discussed with the patient the importance of good handwashing, and good hydration.  Mass has been provided for the patient to use.  Patient is in  agreement with this plan.   Final diagnoses:  None    ED Discharge Orders    None       Ivery QualeBryant, Shayda Kalka, PA-C 07/23/18 1003    Loren RacerYelverton, David, MD 07/27/18 [phone]

## 2018-12-11 ENCOUNTER — Encounter (HOSPITAL_COMMUNITY): Payer: Self-pay

## 2018-12-11 ENCOUNTER — Emergency Department (HOSPITAL_COMMUNITY)
Admission: EM | Admit: 2018-12-11 | Discharge: 2018-12-12 | Disposition: A | Discharge status: Home / Self Care | Payer: Self-pay | Attending: Emergency Medicine | Admitting: Emergency Medicine

## 2018-12-11 ENCOUNTER — Other Ambulatory Visit: Payer: Self-pay

## 2018-12-11 ENCOUNTER — Emergency Department (HOSPITAL_COMMUNITY): Payer: Self-pay

## 2018-12-11 DIAGNOSIS — I10 Essential (primary) hypertension: Secondary | ICD-10-CM | POA: Insufficient documentation

## 2018-12-11 DIAGNOSIS — E119 Type 2 diabetes mellitus without complications: Secondary | ICD-10-CM | POA: Insufficient documentation

## 2018-12-11 DIAGNOSIS — M79604 Pain in right leg: Secondary | ICD-10-CM | POA: Insufficient documentation

## 2018-12-11 DIAGNOSIS — Z7984 Long term (current) use of oral hypoglycemic drugs: Secondary | ICD-10-CM | POA: Insufficient documentation

## 2018-12-11 DIAGNOSIS — R0602 Shortness of breath: Secondary | ICD-10-CM | POA: Insufficient documentation

## 2018-12-11 DIAGNOSIS — Z79899 Other long term (current) drug therapy: Secondary | ICD-10-CM | POA: Insufficient documentation

## 2018-12-11 DIAGNOSIS — Z7982 Long term (current) use of aspirin: Secondary | ICD-10-CM | POA: Insufficient documentation

## 2018-12-11 LAB — CBC WITH DIFFERENTIAL/PLATELET
Abs Immature Granulocytes: 0.01 10*3/uL (ref 0.00–0.07)
Basophils Absolute: 0 10*3/uL (ref 0.0–0.1)
Basophils Relative: 0 %
Eosinophils Absolute: 0.2 10*3/uL (ref 0.0–0.5)
Eosinophils Relative: 3 %
HCT: 35.3 % — ABNORMAL LOW (ref 36.0–46.0)
Hemoglobin: 11.3 g/dL — ABNORMAL LOW (ref 12.0–15.0)
Immature Granulocytes: 0 %
Lymphocytes Relative: 34 %
Lymphs Abs: 2.4 10*3/uL (ref 0.7–4.0)
MCH: 30.1 pg (ref 26.0–34.0)
MCHC: 32 g/dL (ref 30.0–36.0)
MCV: 94.1 fL (ref 80.0–100.0)
Monocytes Absolute: 0.4 10*3/uL (ref 0.1–1.0)
Monocytes Relative: 6 %
Neutro Abs: 4 10*3/uL (ref 1.7–7.7)
Neutrophils Relative %: 57 %
Platelets: 332 10*3/uL (ref 150–400)
RBC: 3.75 MIL/uL — ABNORMAL LOW (ref 3.87–5.11)
RDW: 13.7 % (ref 11.5–15.5)
WBC: 7 10*3/uL (ref 4.0–10.5)
nRBC: 0 % (ref 0.0–0.2)

## 2018-12-11 LAB — BASIC METABOLIC PANEL
Anion gap: 11 (ref 5–15)
BUN: 9 mg/dL (ref 6–20)
CO2: 24 mmol/L (ref 22–32)
Calcium: 8.9 mg/dL (ref 8.9–10.3)
Chloride: 103 mmol/L (ref 98–111)
Creatinine, Ser: 0.68 mg/dL (ref 0.44–1.00)
GFR calc Af Amer: >60 mL/min (ref >60)
GFR calc non Af Amer: >60 mL/min (ref >60)
Glucose, Bld: 244 mg/dL — ABNORMAL HIGH (ref 70–99)
Potassium: 3.6 mmol/L (ref 3.5–5.1)
Sodium: 138 mmol/L (ref 135–145)

## 2018-12-11 LAB — D-DIMER, QUANTITATIVE: D-Dimer, Quant: <0.27 ug/mL-FEU (ref 0.00–0.50)

## 2018-12-11 IMAGING — DX CHEST - 2 VIEW
2 series · 2 of 2 positions shown · non-contrast
Comparison: [DATE]

CLINICAL DATA: Palpitations, shortness of breath

EXAM:
CHEST - 2 VIEW

[chest pa]
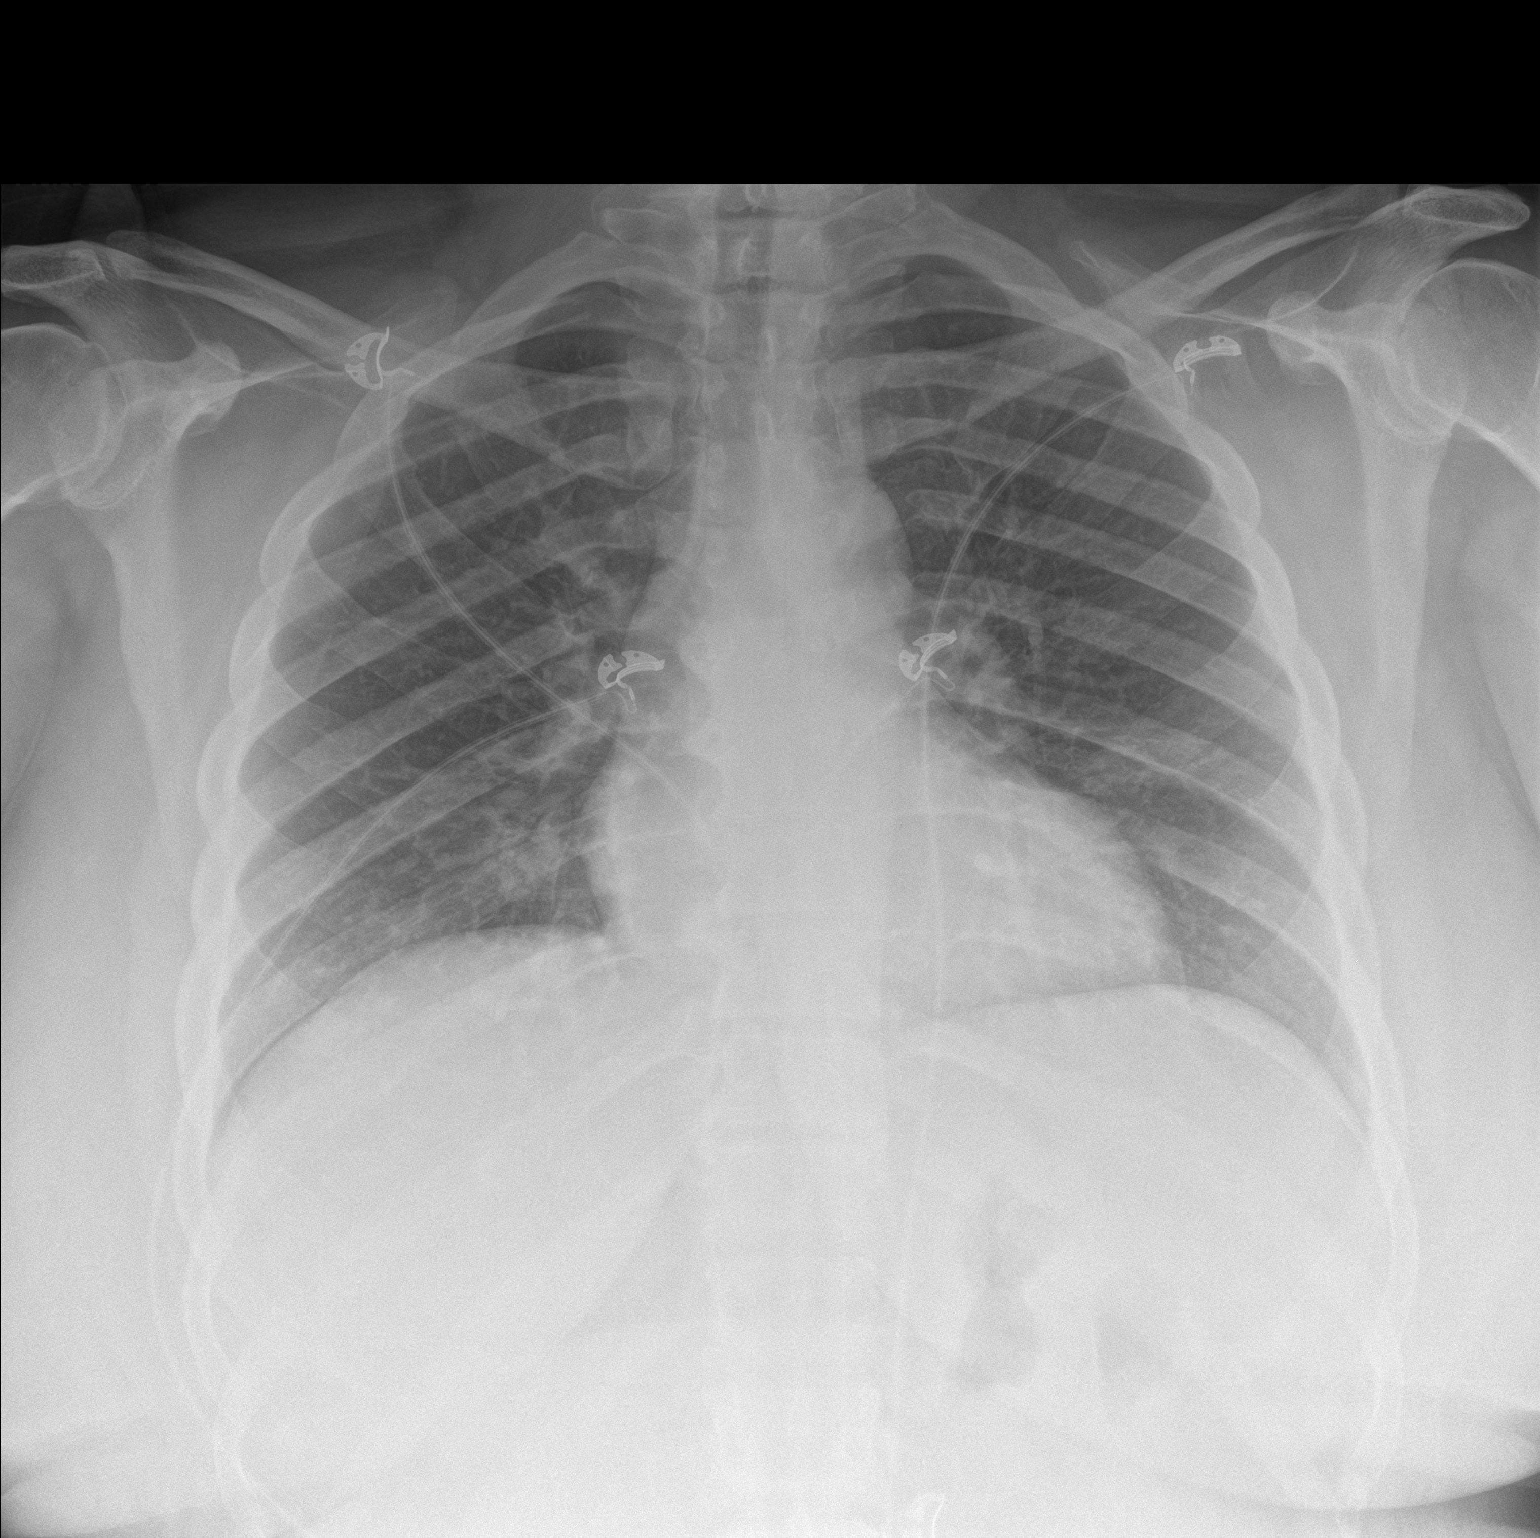

[chest lat]
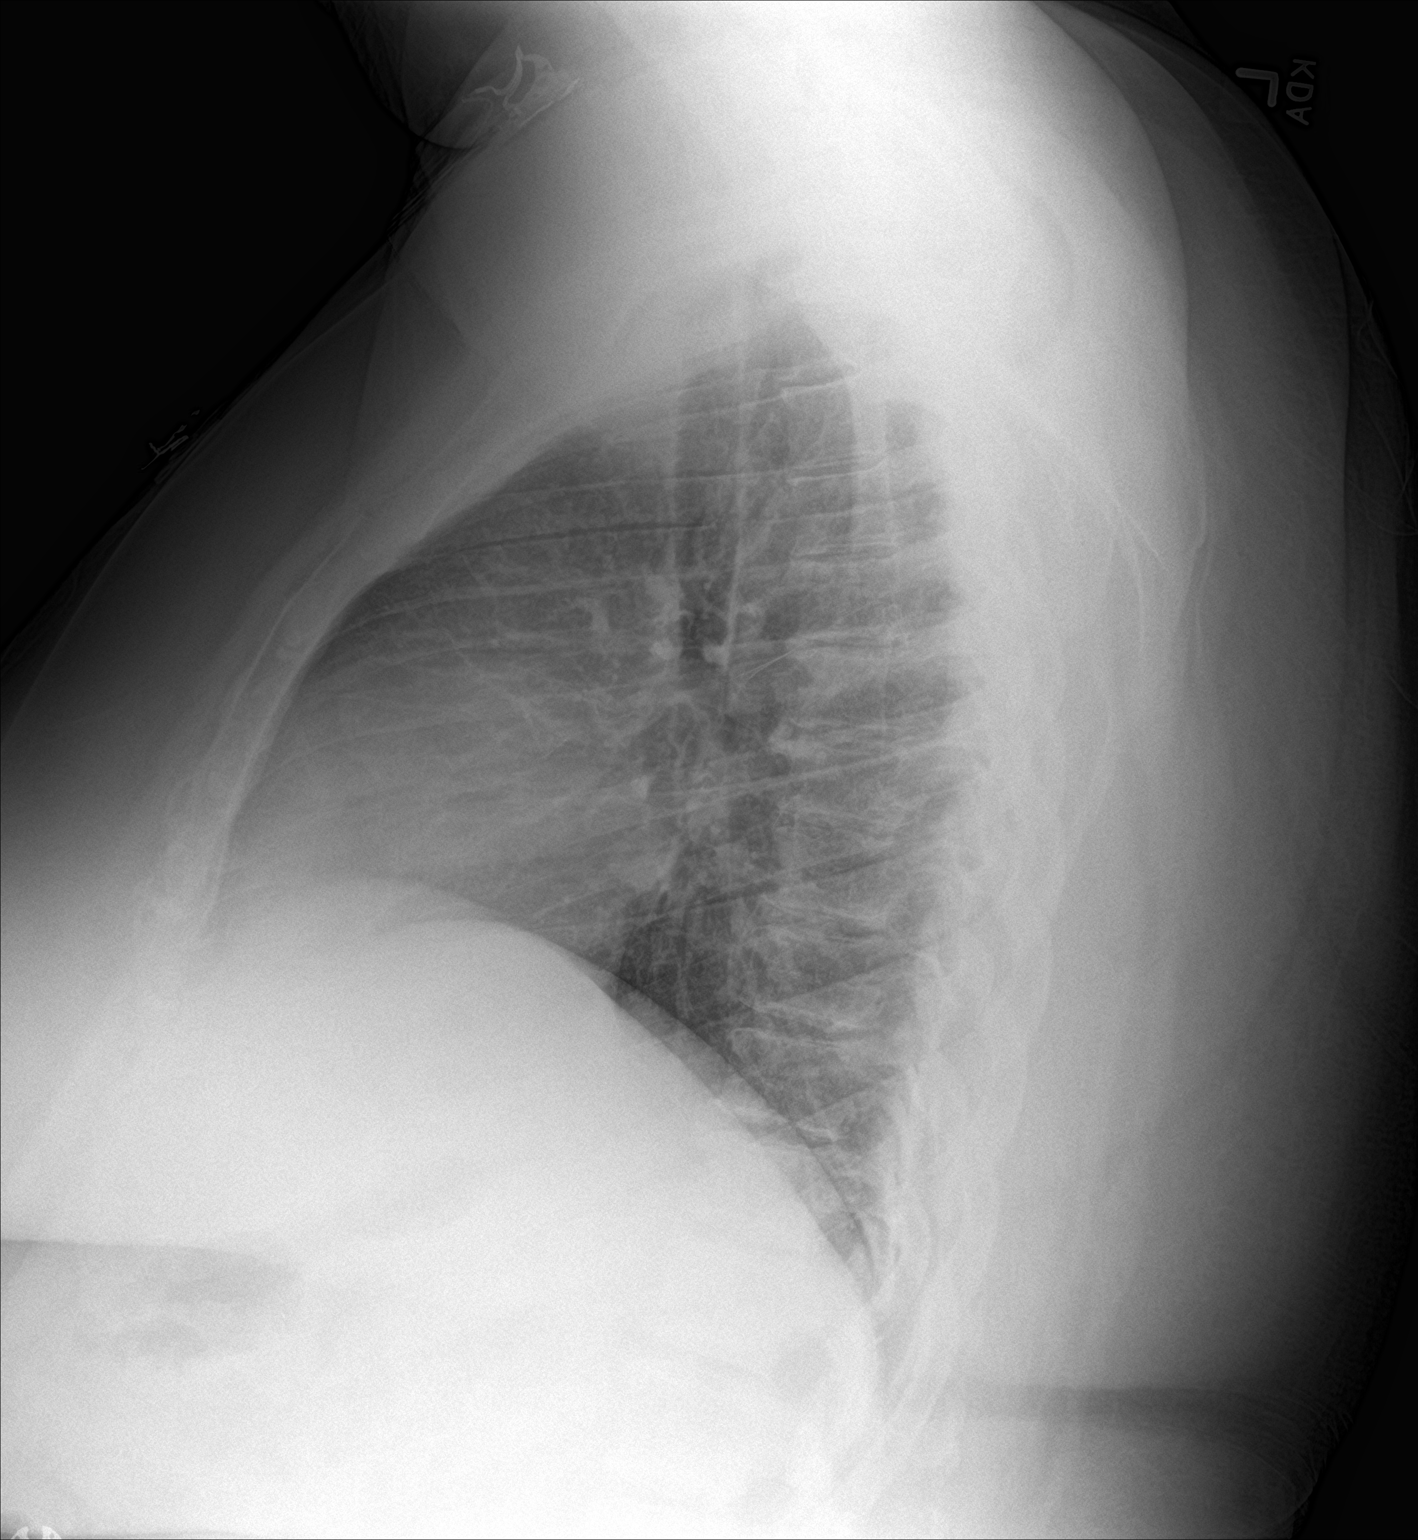

[2 of 2 positions shown; findings below may reference images not displayed]

FINDINGS: Heart and mediastinal contours are within normal limits. No focal
opacities or effusions. No acute bony abnormality.
IMPRESSION: No active cardiopulmonary disease.

## 2018-12-11 NOTE — ED Notes (Signed)
Pt in bed. No SOB present. Given call button

## 2018-12-11 NOTE — ED Triage Notes (Signed)
Pt c/o sob after starting her period two days ago. Thinks it could be anxiety.  Also notices swelling and pain on her inner right thigh.  Took 81mg  aspirin before coming .

## 2018-12-11 NOTE — ED Notes (Addendum)
Pt has palpable  pulses in both feet

## 2018-12-12 MED ORDER — NAPROXEN 500 MG PO TABS: 500.0000 mg | ORAL_TABLET | Freq: Two times a day (BID) | ORAL | 0 refills | Status: DC

## 2018-12-12 NOTE — ED Provider Notes (Signed)
[facility] EMERGENCY DEPARTMENT Provider Note   CSN: [id number] Arrival date & time: 12/11/18  2235    History   Chief Complaint Chief Complaint  Patient presents with  . Shortness of Breath  . Leg Pain    HPI Kathryn Morris is a 43 y.o. female.     HPI  This is a 43 year old female with a history of diabetes, hypertension, SVT who presents with right leg pain.  Patient reports that she has had right thigh pain since onset of her period several days ago.  She denies any injury.  No known history of blood clots.  She reports the pain is in the right medial thigh, it is nonradiating.  Currently her pain is 7 out of 10.  She has not taken anything for the pain.  Additionally she states that she laid down tonight and had some palpitations.  She reports associated shortness of breath palpitations.  No recent fevers or cough.  She denies any chest pain.  This self resolved.  She thinks this may have been related to anxiety.  Past Medical History:  Diagnosis Date  . Anemia   . Diabetes mellitus without complication (HCC)   . Hypertension   . SVT (supraventricular tachycardia) [facility])     Patient Active Problem List   Diagnosis Date Noted  . Morbid obesity (HCC) 03/31/2015  . SVT (supraventricular tachycardia) (HCC) 03/26/2015  . DM2 (diabetes mellitus, type 2) (HCC) 03/26/2015  . HTN (hypertension) 03/26/2015    Past Surgical History:  Procedure Laterality Date  . ELECTROPHYSIOLOGIC STUDY N/A 01/01/2016   Procedure: SVT Ablation;  Surgeon: [name], MD;  Location: [facility] INVASIVE CV LAB;  Service: Cardiovascular;  Laterality: N/A;  . NO PAST SURGERIES       OB History    Gravida  1   Para      Term      Preterm      AB  1   Living  0     SAB  1   TAB      Ectopic      Multiple      Live Births               Home Medications    Prior to Admission medications   Medication Sig Start Date End Date Taking? Authorizing Provider  aspirin EC 81 MG  tablet Take 81 mg by mouth daily.     [provider]  Cyanocobalamin (VITAMIN B 12) 250 MCG LOZG Take by mouth.    [provider]  dexamethasone (DECADRON) 4 MG tablet Take 1 tablet (4 mg total) by mouth 2 (two) times daily with a meal. 07/23/18   Ivery Quale, PA-C  ferrous sulfate 325 (65 FE) MG tablet Take 325-650 mg by mouth 2 (two) times daily. Take 2 tablets by mouth in the AM and 1 tablet in the PM    [provider]  fluticasone (FLONASE) 50 MCG/ACT nasal spray Place 2 sprays into both nostrils daily. 11/12/17   Tommie Sams, DO  folic acid (FOLVITE) 400 MCG tablet Take 400 mcg by mouth daily.    [provider]  hydrochlorothiazide (HYDRODIURIL) 25 MG tablet Take 25 mg by mouth daily as needed (fluid).    [provider]  HYDROcodone-homatropine (HYCODAN) 5-1.5 MG/5ML syrup Take 5 mLs by mouth every 6 (six) hours as needed. 07/23/18   Ivery Quale, PA-C  losartan (COZAAR) 100 MG tablet TAKE 1 TABLET BY MOUTH ONCE DAILY. **  PLEASE SCHEDULE APPT FOR FURTHER REFILLS** 02/23/17   Allred, Fayrene FearingJames, MD  metFORMIN (GLUCOPHAGE) 1000 MG tablet Take 1,000 mg by mouth 2 (two) times daily with a meal.    [provider]  metoprolol succinate (TOPROL-XL) 50 MG 24 hr tablet TAKE 1 TABLET BY MOUTH ONCE DAILY 02/23/17   Allred, Fayrene FearingJames, MD  Multiple Vitamin (MULTIVITAMIN WITH MINERALS) TABS tablet Take 1 tablet by mouth daily.    [provider]  naproxen (NAPROSYN) 500 MG tablet Take 1 tablet (500 mg total) by mouth 2 (two) times daily. 12/12/18   Horton, Mayer Maskerourtney F, MD  NOVOLIN 70/30 RELION (70-30) 100 UNIT/ML injection  11/09/17   [provider]    Family History Family History  Problem Relation Age of Onset  . Diabetes Mother   . Heart failure Mother   . Diabetes Sister     Social History Social History   Tobacco Use  . Smoking status: Never Smoker  . Smokeless tobacco: Never Used  Substance Use Topics  . Alcohol use: No   . Drug use: No     Allergies   Lisinopril   Review of Systems Review of Systems  Constitutional: Negative for chills and fever.  Respiratory: Positive for shortness of breath. Negative for cough.   Cardiovascular: Negative for chest pain and leg swelling.  Gastrointestinal: Negative for abdominal pain, nausea and vomiting.  Genitourinary: Negative for dysuria.  Musculoskeletal:       Right leg pain  Skin: Negative for rash and wound.  Neurological: Negative for weakness.  All other systems reviewed and are negative.    Physical Exam Updated Vital Signs BP (!) 144/78   Pulse 64   Temp 98.6 F (37 C) (Oral)   Resp 20   Ht 1.803 m (5\' 11" )   Wt 127 kg   LMP 12/09/2018   SpO2 97%   BMI 39.05 kg/m   Physical Exam Vitals signs and nursing note reviewed.  Constitutional:      Appearance: She is well-developed. She is not ill-appearing.  HENT:     Head: Normocephalic and atraumatic.     Mouth/Throat:     Mouth: Mucous membranes are moist.  Eyes:     Pupils: Pupils are equal, round, and reactive to light.  Neck:     Musculoskeletal: Neck supple.  Cardiovascular:     Rate and Rhythm: Normal rate and regular rhythm.     Heart sounds: Normal heart sounds.  Pulmonary:     Effort: Pulmonary effort is normal. No respiratory distress.     Breath sounds: No wheezing.  Abdominal:     General: Bowel sounds are normal.     Palpations: Abdomen is soft.  Musculoskeletal:     Right lower leg: She exhibits tenderness. No edema.     Left lower leg: She exhibits no tenderness. No edema.     Comments: Right medial thigh tenderness palpation, no overlying skin changes or bruising, no appreciable edema  Skin:    General: Skin is warm and dry.  Neurological:     Mental Status: She is alert and oriented to person, place, and time.  Psychiatric:        Mood and Affect: Mood normal.      ED Treatments / Results  Labs (all labs ordered are listed, but only abnormal results  are displayed) Labs Reviewed  CBC WITH DIFFERENTIAL/PLATELET - Abnormal; Notable for the following components:      Result Value   RBC 3.75 (*)  Hemoglobin 11.3 (*)    HCT 35.3 (*)    All other components within normal limits  BASIC METABOLIC PANEL - Abnormal; Notable for the following components:   Glucose, Bld 244 (*)    All other components within normal limits  D-DIMER, QUANTITATIVE (NOT AT [facility])    EKG EKG Interpretation  Date/Time:  Saturday Dec 11 2018 23:26:39 EDT Ventricular Rate:  71 PR Interval:    QRS Duration: 97 QT Interval:  401 QTC Calculation: 436 R Axis:   -2 Text Interpretation:  Sinus arrhythmia Left ventricular hypertrophy Borderline T abnormalities, anterior leads Baseline wander in lead(s) II V3 Artifact No significant change since last tracing Confirmed by Ross Marcus ([id number]) on 12/11/2018 11:53:12 PM   Radiology Dg Chest 2 View  Result Date: 12/11/2018 CLINICAL DATA:  Palpitations, shortness of breath EXAM: CHEST - 2 VIEW COMPARISON:  07/23/2018 FINDINGS: Heart and mediastinal contours are within normal limits. No focal opacities or effusions. No acute bony abnormality. IMPRESSION: No active cardiopulmonary disease. Electronically Signed   By: Charlett Nose M.D.   On: 12/11/2018 23:46    Procedures Procedures (including critical care time)  Medications Ordered in ED Medications - No data to display   Initial Impression / Assessment and Plan / ED Course  I have reviewed the triage vital signs and the nursing notes.  Pertinent labs & imaging results that were available during my care of the patient were reviewed by me and considered in my medical decision making (see chart for details).        Patient presents with right leg pain.  Additionally had an episode of palpitations and shortness of breath tonight that self resolved.  She has a history of SVT but is status post ablation.  Vital signs are reassuring.  O2 sats 97%.  Pulse is 64.  EKG  shows no evidence of arrhythmia or ischemia.  Chest x-ray shows no evidence of pneumothorax or pneumonia.  Screening d-dimer was sent given right leg pain.  D-dimer is negative.  She is otherwise low risk for PE.  Other screening lab work shows no significant metabolic derangement.  She does have elevated glucose but has a history of diabetes.  No anion gap.  On recheck, patient improved with anti-inflammatories.  Recommend anti-inflammatories at home and close monitoring.  Follow-up with primary physician.  After history, exam, and medical workup I feel the patient has been appropriately medically screened and is safe for discharge home. Pertinent diagnoses were discussed with the patient. Patient was given return precautions.  Final Clinical Impressions(s) / ED Diagnoses   Final diagnoses:  Right leg pain  Shortness of breath    ED Discharge Orders         Ordered    naproxen (NAPROSYN) 500 MG tablet  2 times daily     12/12/18 0028           Horton, Mayer Masker, MD 12/12/18 [phone]

## 2018-12-12 NOTE — Discharge Instructions (Signed)
You were seen today for shortness of breath and leg pain.  Your work-up is reassuring.  Take naproxen as needed for pain.

## 2020-01-10 ENCOUNTER — Other Ambulatory Visit: Payer: Self-pay

## 2020-01-10 ENCOUNTER — Emergency Department (HOSPITAL_COMMUNITY): Payer: Self-pay

## 2020-01-10 ENCOUNTER — Encounter (HOSPITAL_COMMUNITY): Payer: Self-pay

## 2020-01-10 ENCOUNTER — Emergency Department (HOSPITAL_COMMUNITY)
Admission: EM | Admit: 2020-01-10 | Discharge: 2020-01-10 | Disposition: A | Discharge status: Home / Self Care | Payer: Self-pay | Attending: Emergency Medicine | Admitting: Emergency Medicine

## 2020-01-10 DIAGNOSIS — I471 Supraventricular tachycardia: Secondary | ICD-10-CM | POA: Insufficient documentation

## 2020-01-10 DIAGNOSIS — Z7901 Long term (current) use of anticoagulants: Secondary | ICD-10-CM | POA: Insufficient documentation

## 2020-01-10 DIAGNOSIS — E119 Type 2 diabetes mellitus without complications: Secondary | ICD-10-CM | POA: Insufficient documentation

## 2020-01-10 DIAGNOSIS — Z7982 Long term (current) use of aspirin: Secondary | ICD-10-CM | POA: Insufficient documentation

## 2020-01-10 DIAGNOSIS — M5416 Radiculopathy, lumbar region: Secondary | ICD-10-CM

## 2020-01-10 DIAGNOSIS — I10 Essential (primary) hypertension: Secondary | ICD-10-CM | POA: Insufficient documentation

## 2020-01-10 DIAGNOSIS — G5601 Carpal tunnel syndrome, right upper limb: Secondary | ICD-10-CM | POA: Insufficient documentation

## 2020-01-10 DIAGNOSIS — Z794 Long term (current) use of insulin: Secondary | ICD-10-CM | POA: Insufficient documentation

## 2020-01-10 DIAGNOSIS — Z79899 Other long term (current) drug therapy: Secondary | ICD-10-CM | POA: Insufficient documentation

## 2020-01-10 LAB — DIFFERENTIAL
Abs Immature Granulocytes: 0.02 10*3/uL (ref 0.00–0.07)
Basophils Absolute: 0 10*3/uL (ref 0.0–0.1)
Basophils Relative: 0 %
Eosinophils Absolute: 0.2 10*3/uL (ref 0.0–0.5)
Eosinophils Relative: 2 %
Immature Granulocytes: 0 %
Lymphocytes Relative: 25 %
Lymphs Abs: 2.1 10*3/uL (ref 0.7–4.0)
Monocytes Absolute: 0.5 10*3/uL (ref 0.1–1.0)
Monocytes Relative: 6 %
Neutro Abs: 5.6 10*3/uL (ref 1.7–7.7)
Neutrophils Relative %: 67 %

## 2020-01-10 LAB — PROTIME-INR
INR: 1.1 (ref 0.8–1.2)
Prothrombin Time: 13.4 seconds (ref 11.4–15.2)

## 2020-01-10 LAB — COMPREHENSIVE METABOLIC PANEL
ALT: 31 U/L (ref 0–44)
AST: 30 U/L (ref 15–41)
Albumin: 3.7 g/dL (ref 3.5–5.0)
Alkaline Phosphatase: 91 U/L (ref 38–126)
Anion gap: 11 (ref 5–15)
BUN: 13 mg/dL (ref 6–20)
CO2: 27 mmol/L (ref 22–32)
Calcium: 9.1 mg/dL (ref 8.9–10.3)
Chloride: 98 mmol/L (ref 98–111)
Creatinine, Ser: 0.75 mg/dL (ref 0.44–1.00)
GFR calc Af Amer: >60 mL/min (ref >60)
GFR calc non Af Amer: >60 mL/min (ref >60)
Glucose, Bld: 279 mg/dL — ABNORMAL HIGH (ref 70–99)
Potassium: 3.9 mmol/L (ref 3.5–5.1)
Sodium: 136 mmol/L (ref 135–145)
Total Bilirubin: 0.5 mg/dL (ref 0.3–1.2)
Total Protein: 7.8 g/dL (ref 6.5–8.1)

## 2020-01-10 LAB — APTT: aPTT: 29 seconds (ref 24–36)

## 2020-01-10 LAB — CBC
HCT: 35.3 % — ABNORMAL LOW (ref 36.0–46.0)
Hemoglobin: 11.1 g/dL — ABNORMAL LOW (ref 12.0–15.0)
MCH: 29.6 pg (ref 26.0–34.0)
MCHC: 31.4 g/dL (ref 30.0–36.0)
MCV: 94.1 fL (ref 80.0–100.0)
Platelets: 398 10*3/uL (ref 150–400)
RBC: 3.75 MIL/uL — ABNORMAL LOW (ref 3.87–5.11)
RDW: 14.3 % (ref 11.5–15.5)
WBC: 8.4 10*3/uL (ref 4.0–10.5)
nRBC: 0 % (ref 0.0–0.2)

## 2020-01-10 LAB — CBG MONITORING, ED: Glucose-Capillary: 260 mg/dL — ABNORMAL HIGH (ref 70–99)

## 2020-01-10 LAB — POC URINE PREG, ED: Preg Test, Ur: NEGATIVE

## 2020-01-10 IMAGING — DX DG LUMBAR SPINE COMPLETE 4+V
5 series · 5 of 5 positions shown · non-contrast
Comparison: None.

CLINICAL DATA: Numbness in the right arm and leg for 1 week.

EXAM:
LUMBAR SPINE - COMPLETE 4+ VIEW

[l-spine ap]
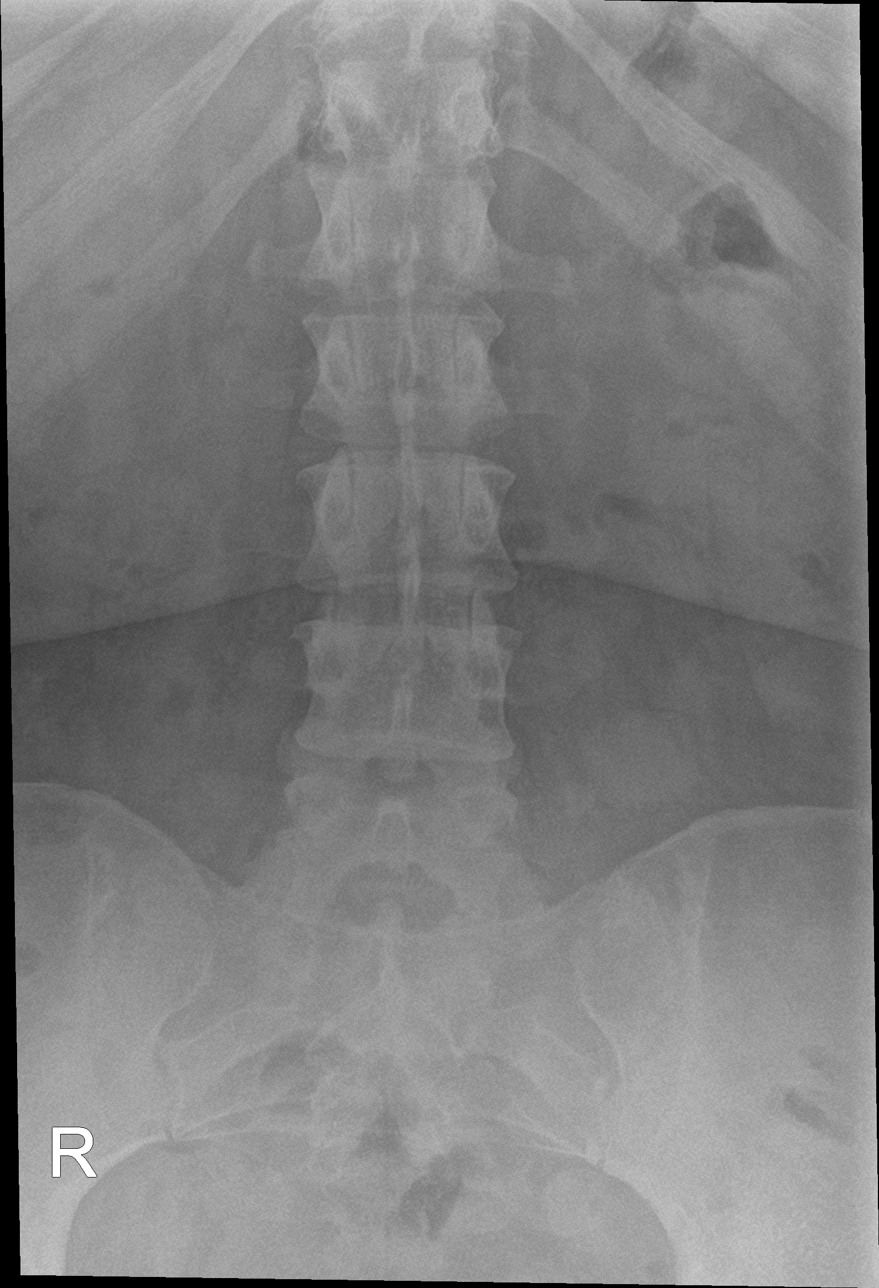

[l-spine obl (1 of 2)]
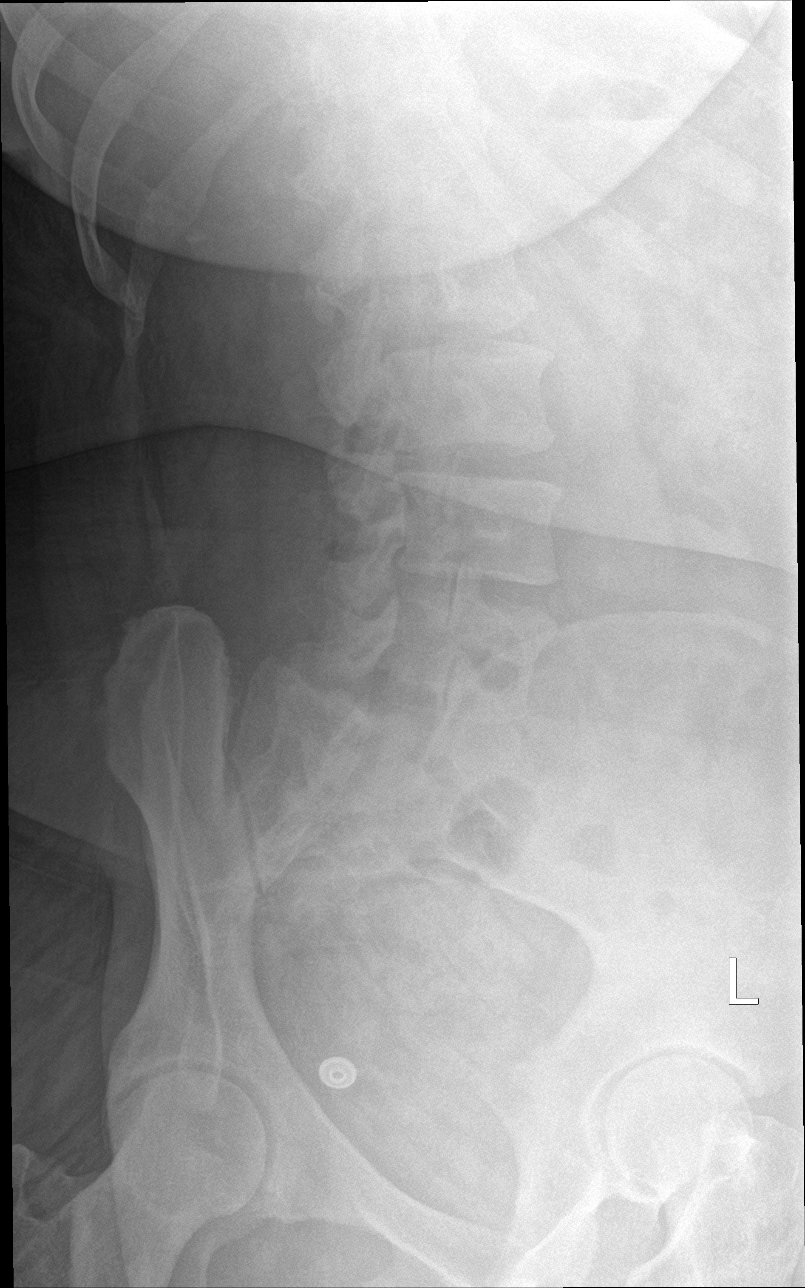

[l-spine obl (2 of 2)]
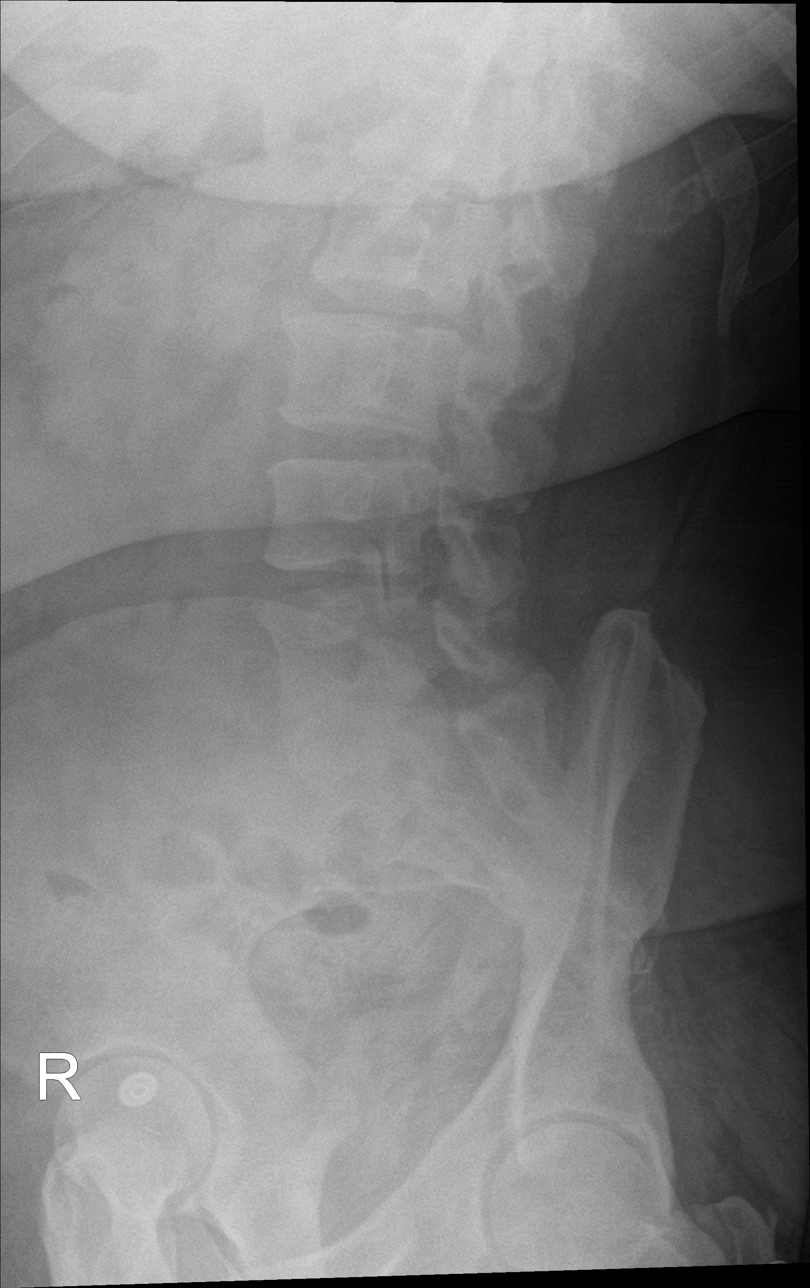

[l-spine lat]
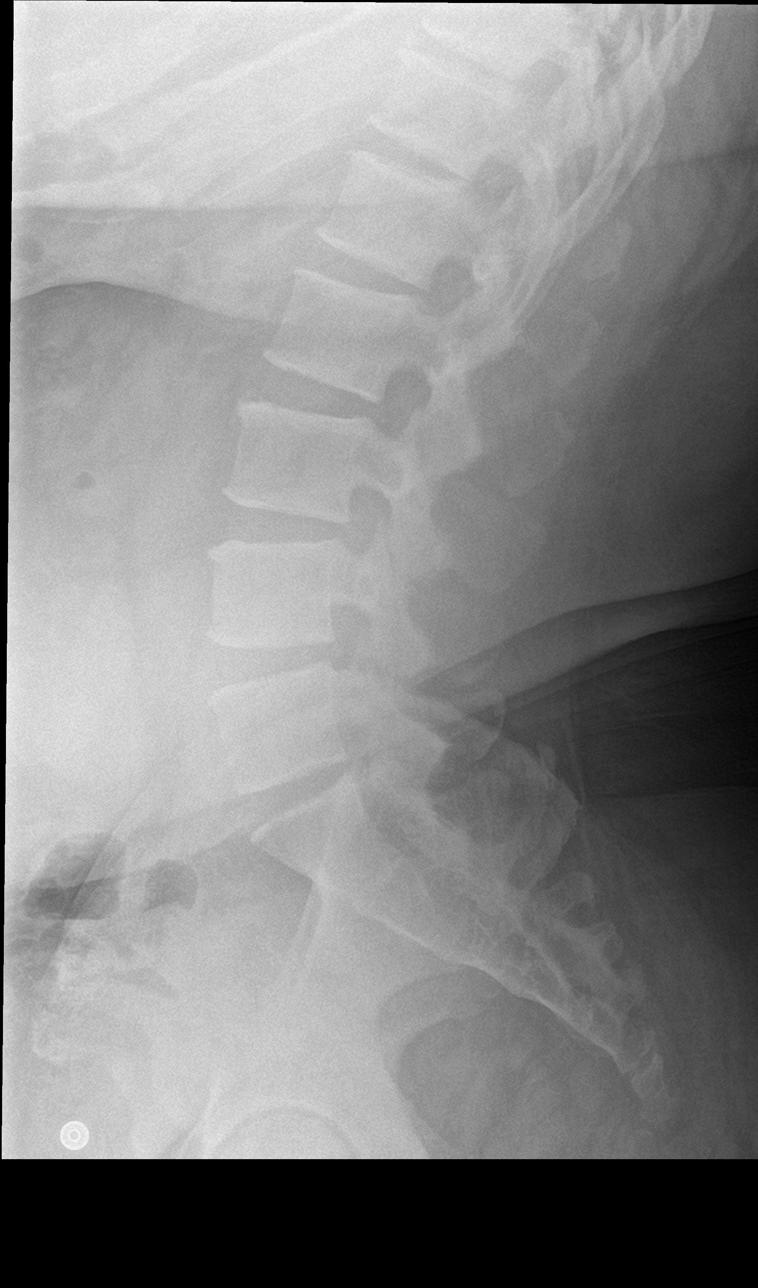

[l-spine spot]
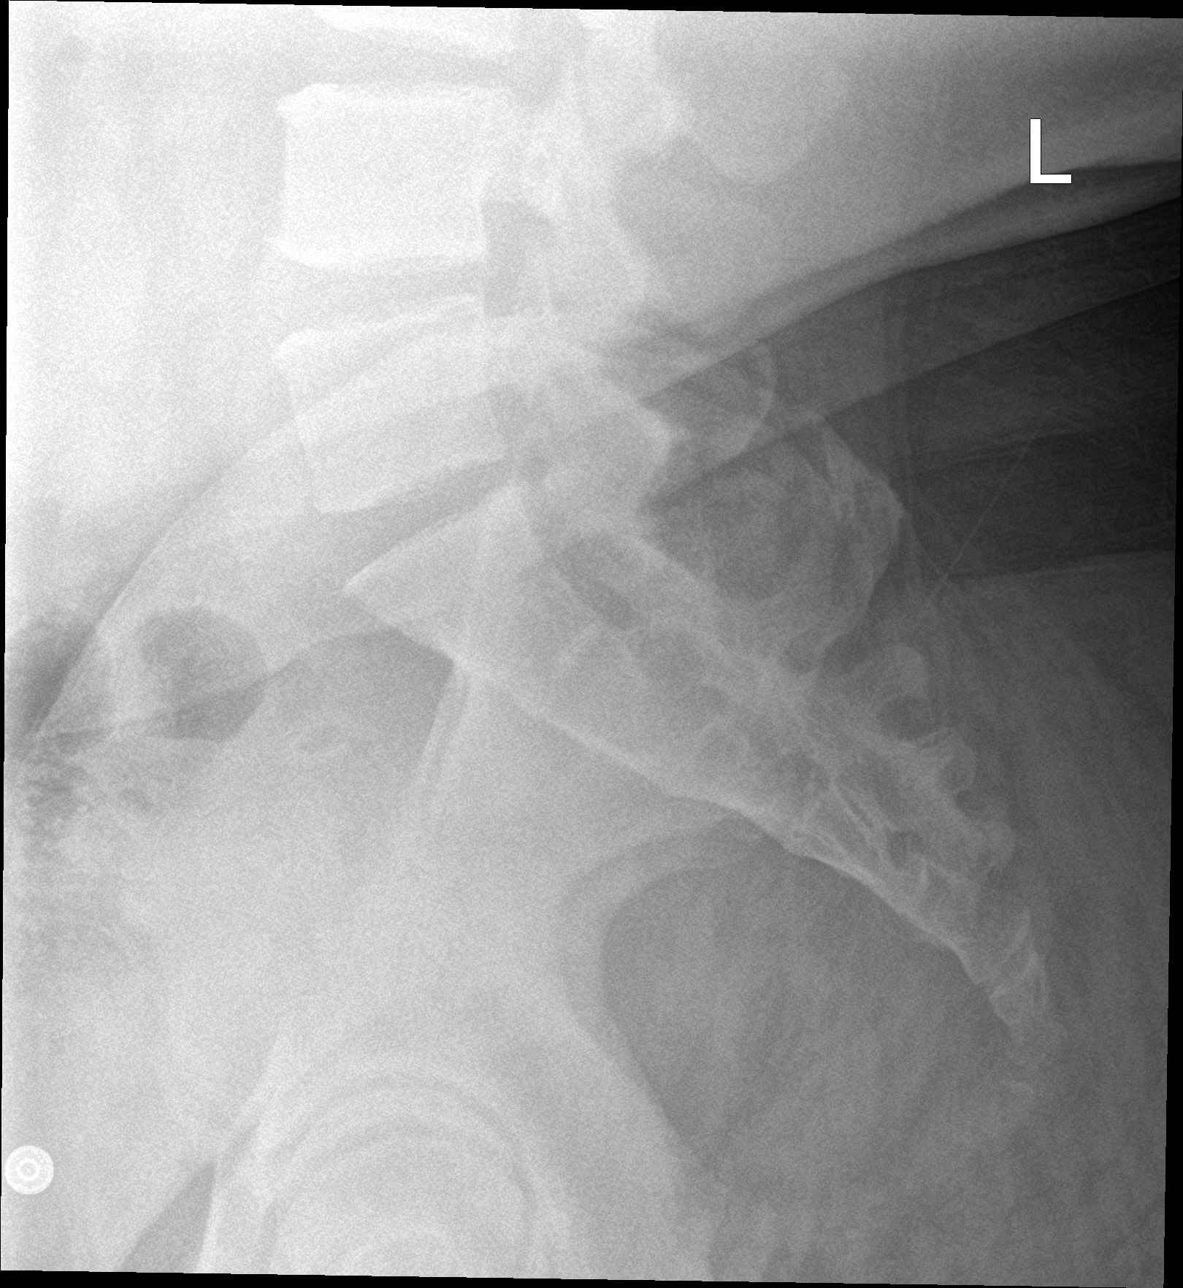

[5 of 5 positions shown; findings below may reference images not displayed]

FINDINGS: There are 5 lumbar type vertebral bodies. The alignment is normal.
The disc spaces are preserved. There is minimal intervertebral
spurring and facet disease. No evidence of fracture or pars defect.
The sacroiliac joints appear normal.
IMPRESSION: No acute osseous findings or malalignment. Minimal intervertebral
spurring and facet disease.

## 2020-01-10 IMAGING — DX DG CERVICAL SPINE COMPLETE 4+V
6 series · 6 of 6 positions shown · non-contrast
Comparison: None.

CLINICAL DATA: Right arm numbness.

EXAM:
CERVICAL SPINE - COMPLETE 4+ VIEW

[c-spine obl (1 of 2)]
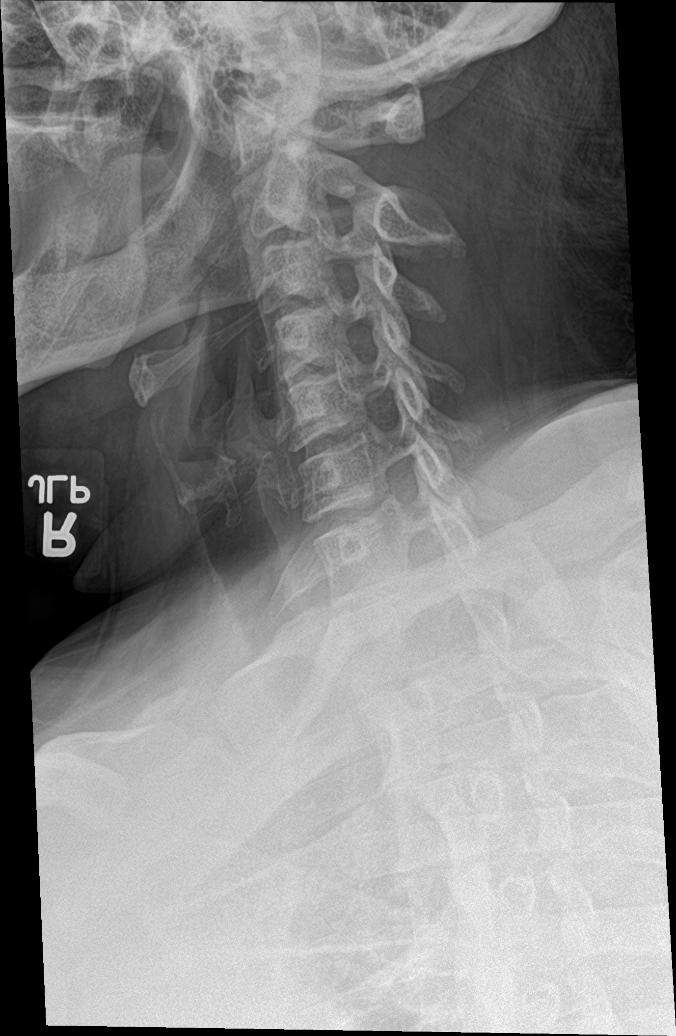

[c-spine obl (2 of 2)]
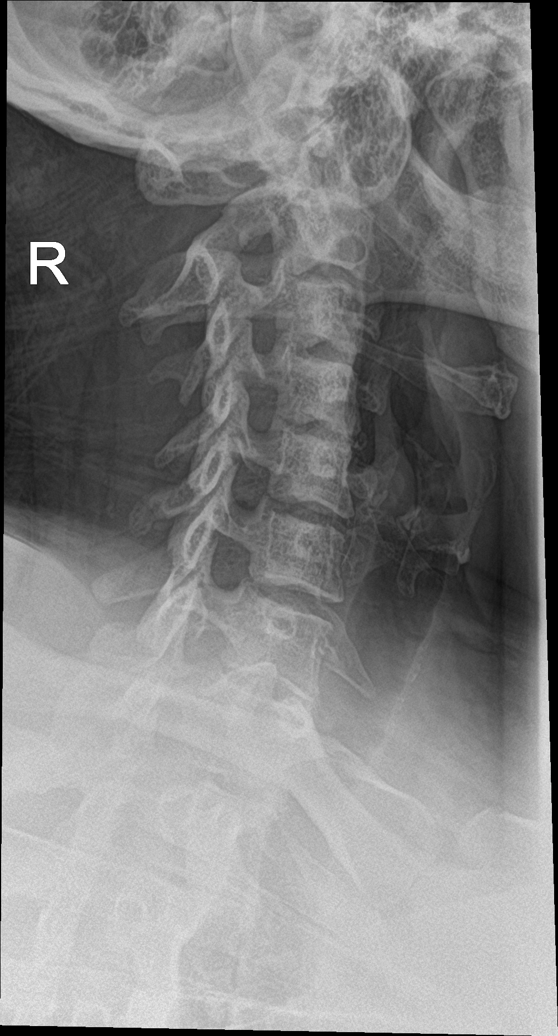

[c-spine open mouth]
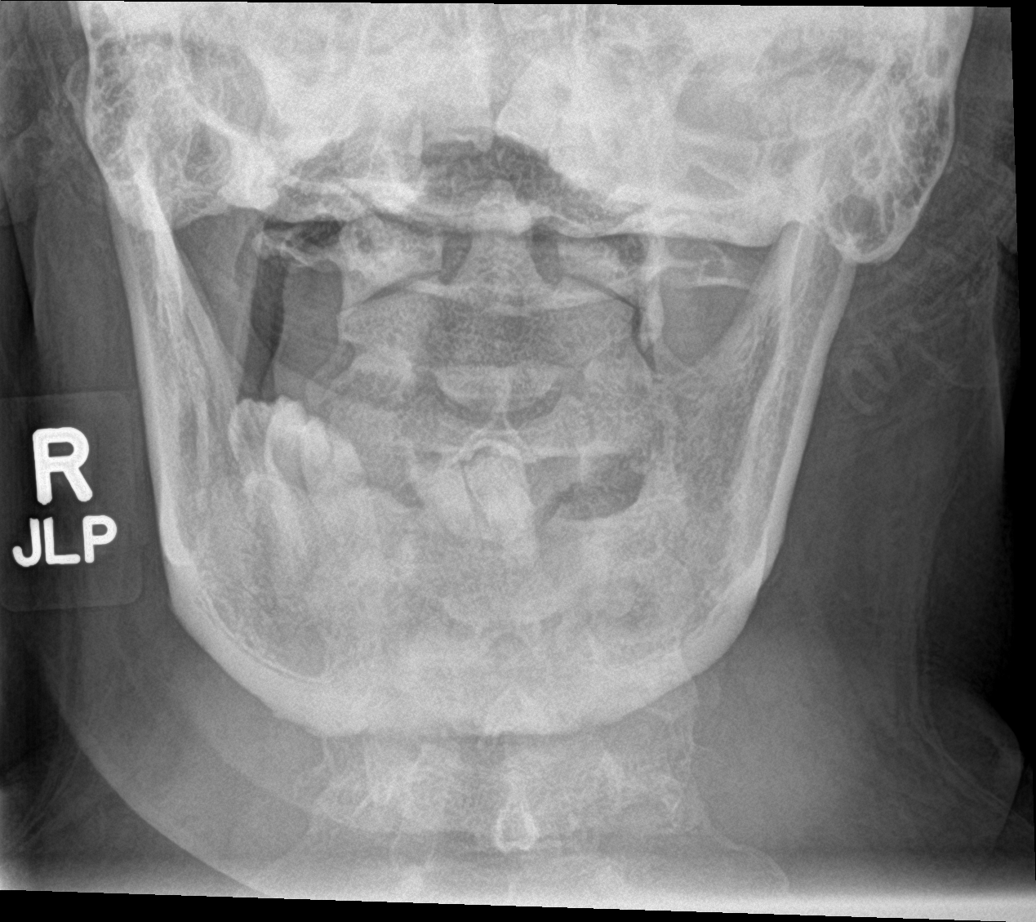

[c-spine swimmers trauma]
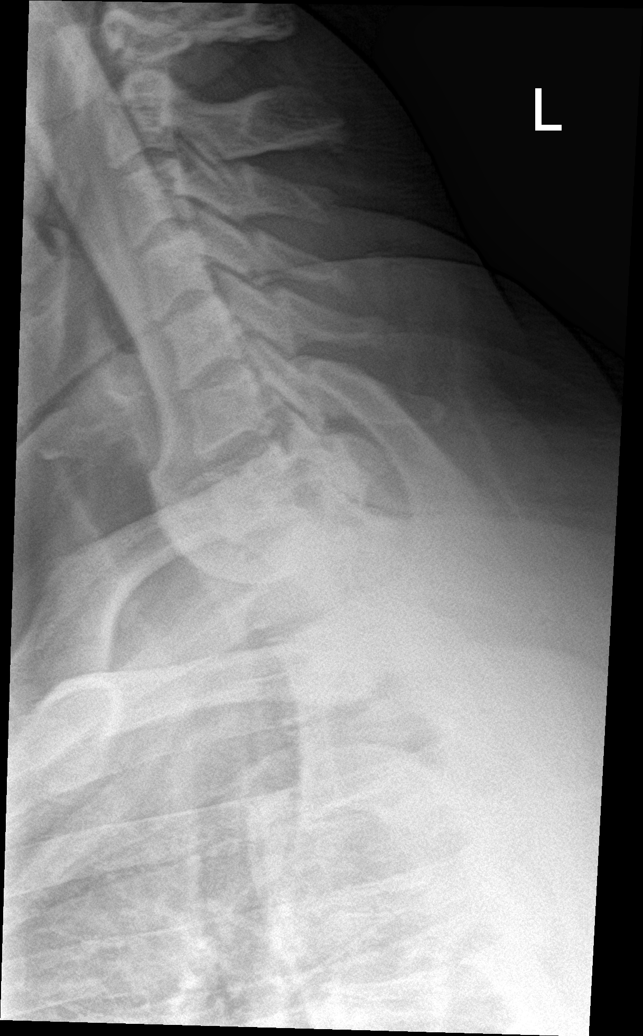

[c-spine lat]
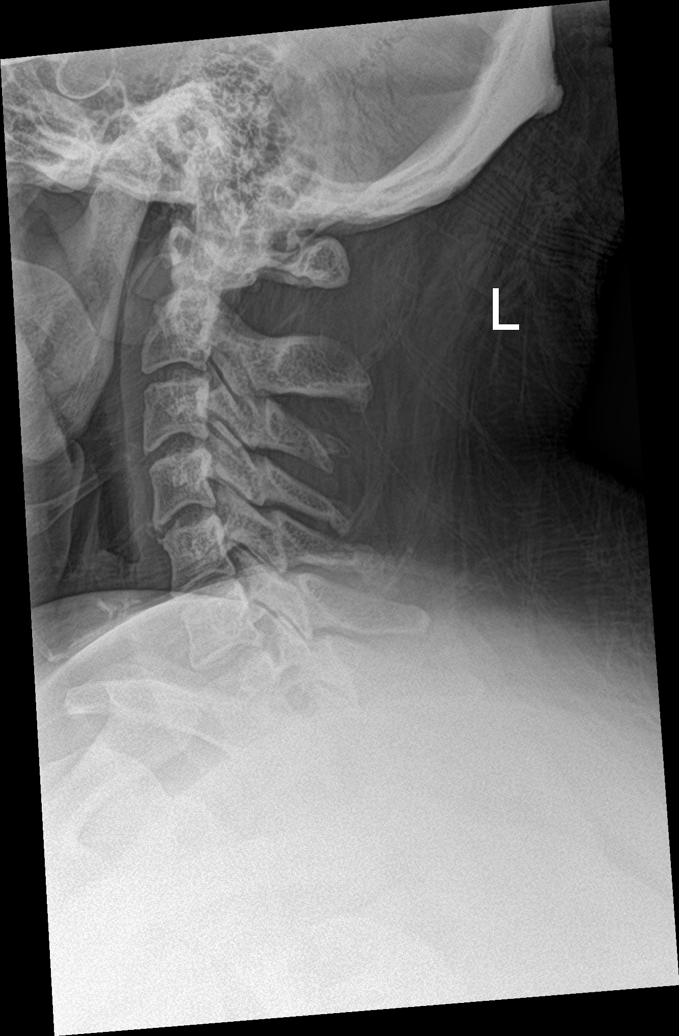

[c-spine ap]
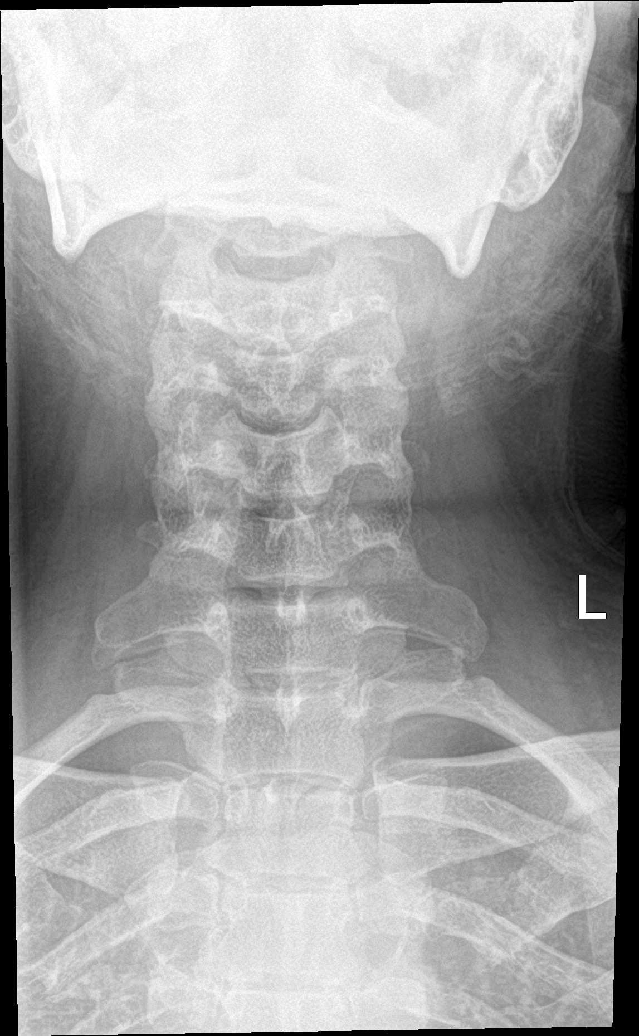

[6 of 6 positions shown; findings below may reference images not displayed]

FINDINGS: There is no evidence of cervical spine fracture or prevertebral soft
tissue swelling. Alignment is normal. No other significant bone
abnormalities are identified.
IMPRESSION: Negative cervical spine radiographs.

## 2020-01-10 IMAGING — CT CT HEAD W/O CM
3 series · 16 of 47 positions shown, 19 images · non-contrast
Comparison: [EJ]

CLINICAL DATA: Right arm and leg numbness

EXAM:
CT HEAD WITHOUT CONTRAST
TECHNIQUE: Contiguous axial images were obtained from the base of the skull
through the vertex without intravenous contrast.

[Series 2: head w o · axial · 0.44mm/px · z∈[+2,+127]mm · 10 of 30 slices shown, 13 images]
[im 3/30  brain]
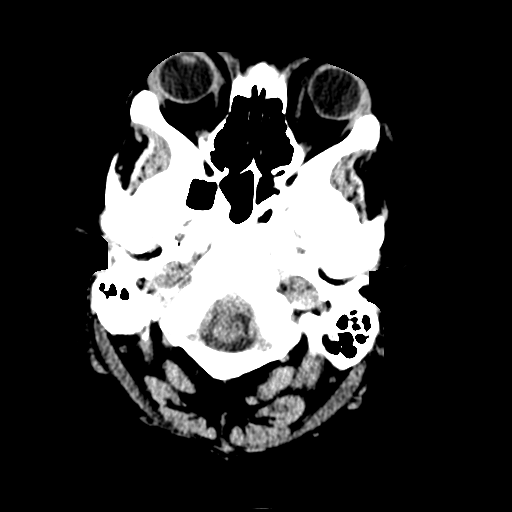
[im 3/30  bone]
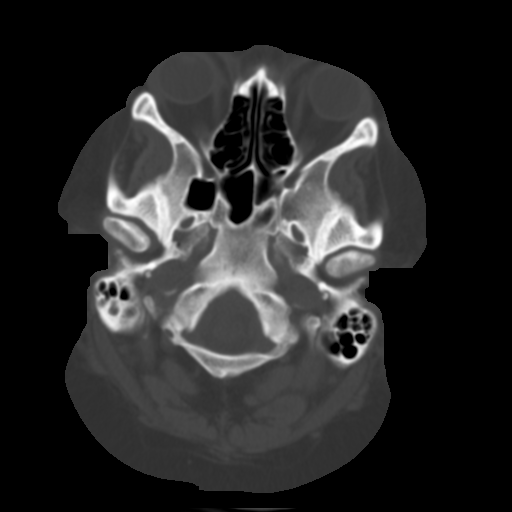
[im 6/30  brain]
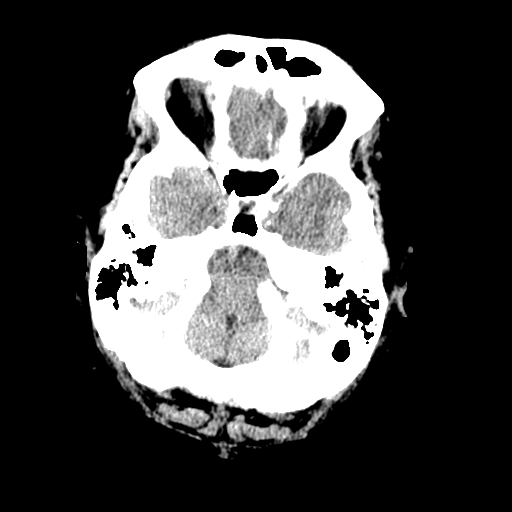
[im 9/30  brain]
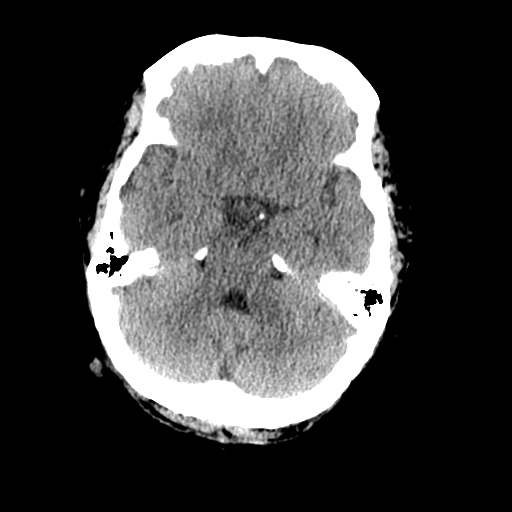
[im 11/30  brain]
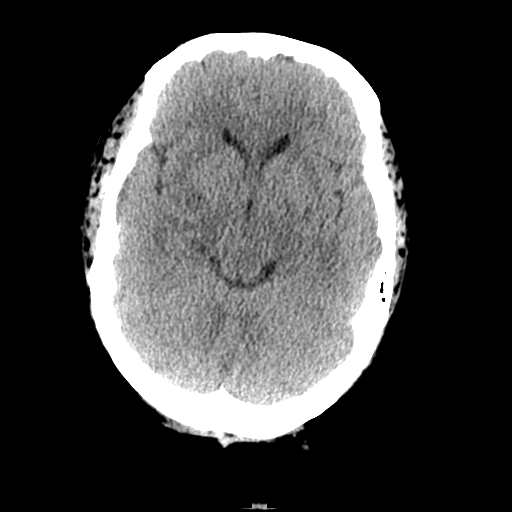
[im 14/30  brain]
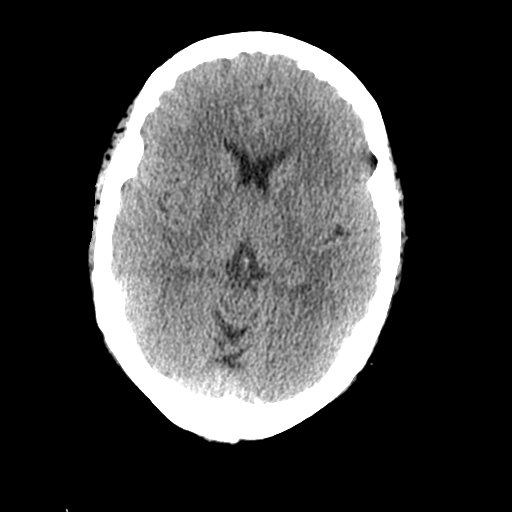
[im 14/30  bone]
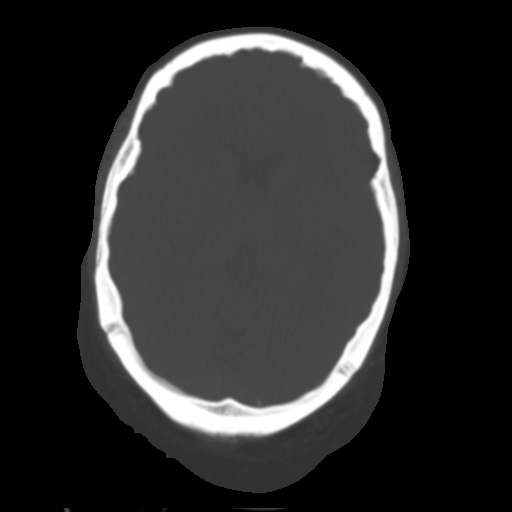
[im 17/30  brain]
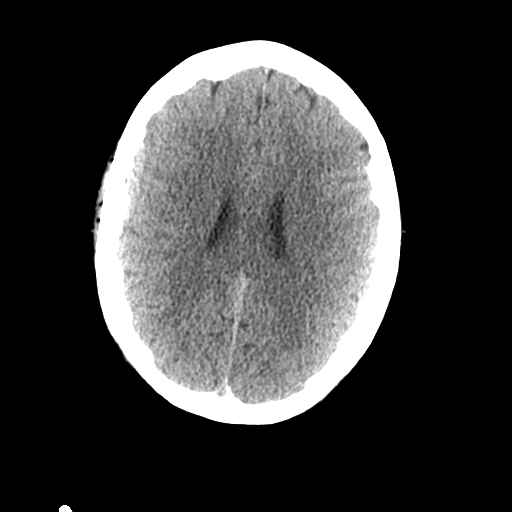
[im 20/30  brain]
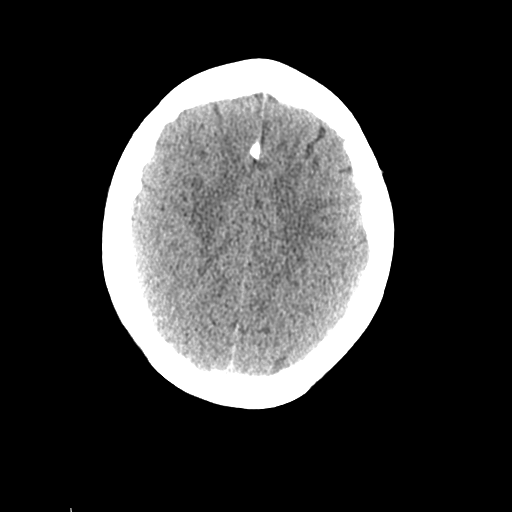
[im 23/30  brain]
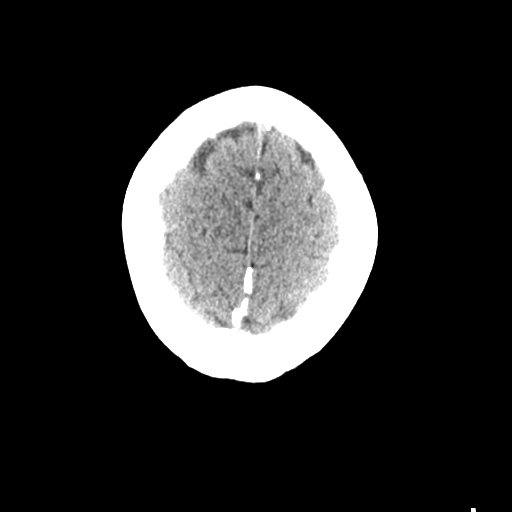
[im 25/30  brain]
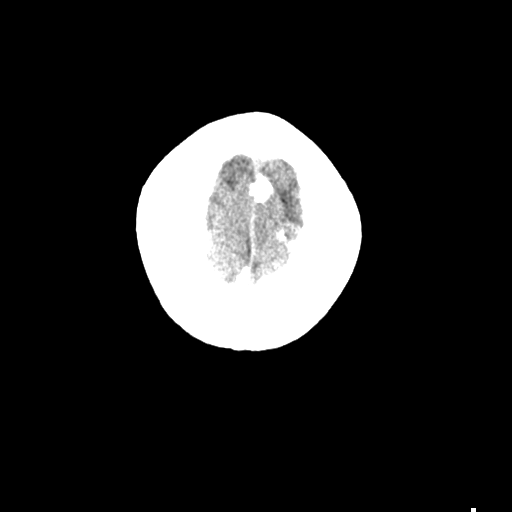
[im 25/30  bone]
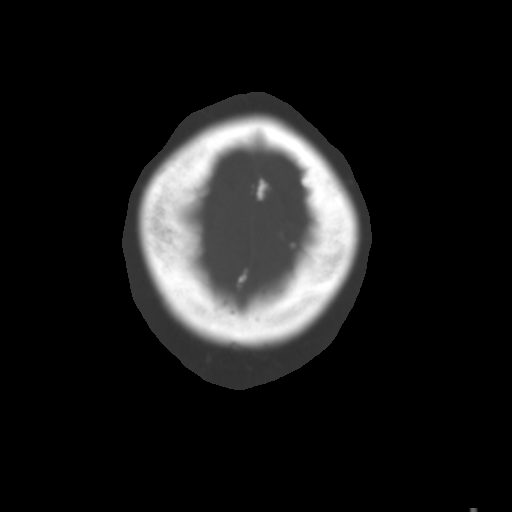
[im 28/30  brain]
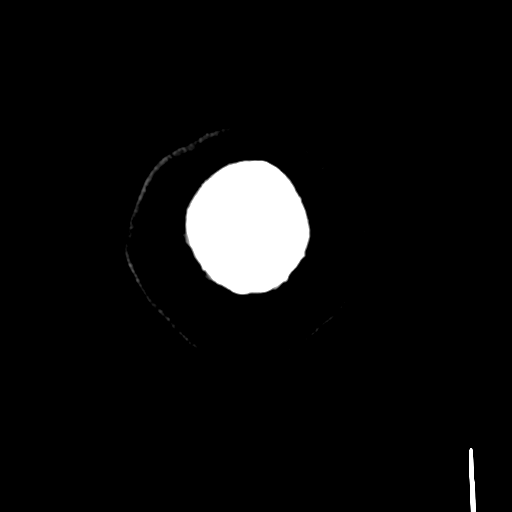

[Series 4: coronal soft · coronal · 0.34mm/px · 3 of 74 slices shown]
[im 25/74  brain]
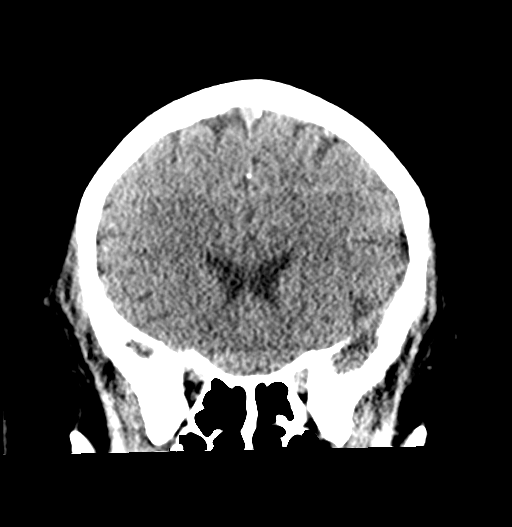
[im 33/74  brain]
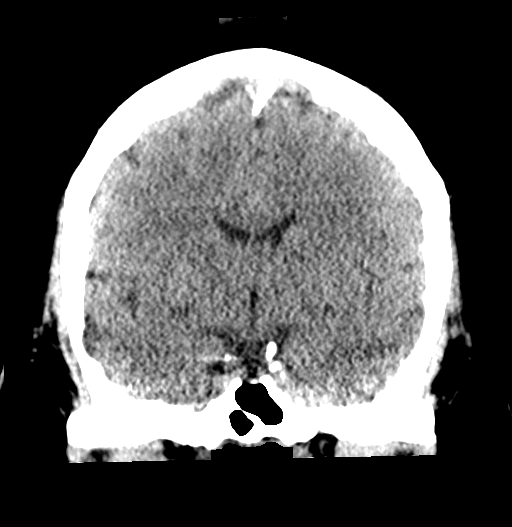
[im 41/74  brain]
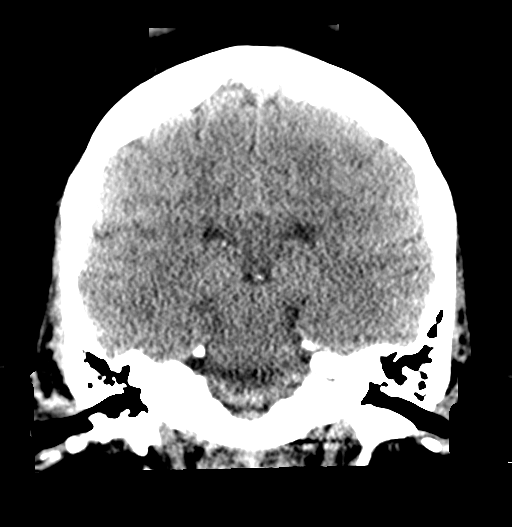

[Series 5: sagittal soft · sagittal · 0.35mm/px · 3 of 58 slices shown]
[im 20/58  brain]
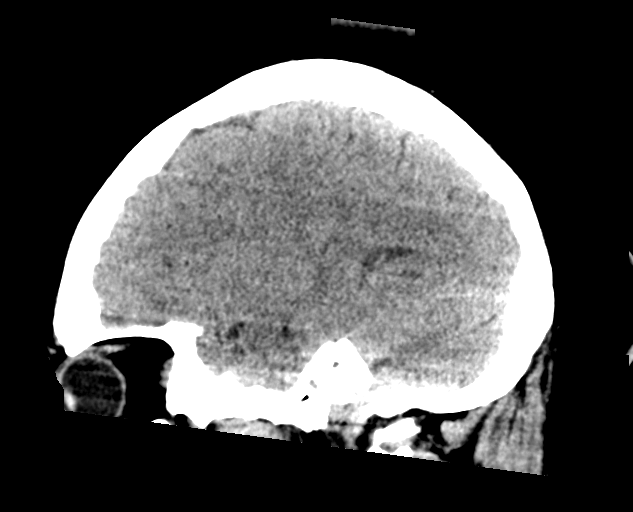
[im 29/58  brain]
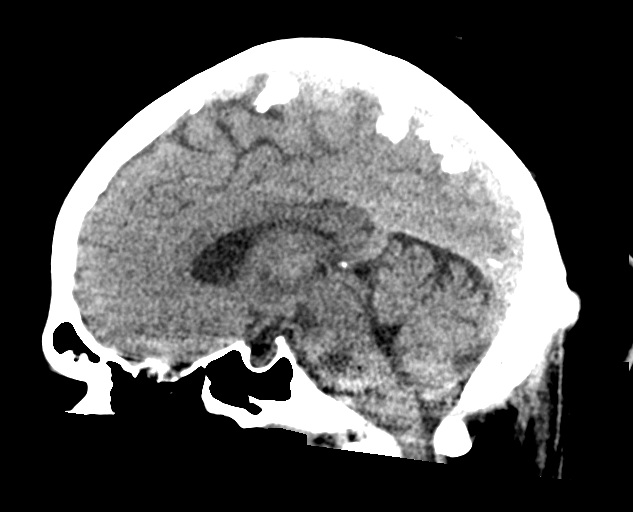
[im 39/58  brain]
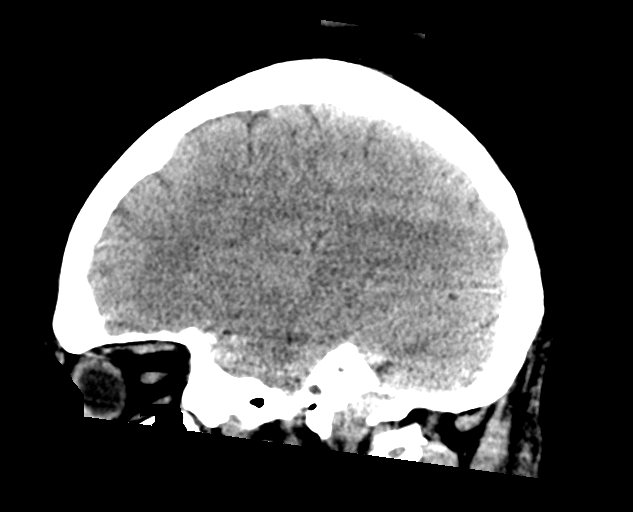

[16 of 47 positions shown; findings below may reference images not displayed]

FINDINGS: Brain: There is no acute intracranial hemorrhage, mass effect, or
edema. Gray-white differentiation is preserved. There is no
extra-axial fluid collection. Ventricles and sulci are within normal
limits in size and configuration.

Vascular: No hyperdense vessel or unexpected calcification.

Skull: Calvarium is unremarkable.

Sinuses/Orbits: No acute finding.

Other: None.
IMPRESSION: No acute intracranial hemorrhage, mass effect, or evidence of acute
infarction.

## 2020-01-10 MED ORDER — NAPROXEN 500 MG PO TABS
500.0000 mg | ORAL_TABLET | Freq: Two times a day (BID) | ORAL | 0 refills | Status: DC
Start: 2020-01-10 — End: 2023-04-09

## 2020-01-10 MED ORDER — NAPROXEN 250 MG PO TABS
500.0000 mg | ORAL_TABLET | Freq: Once | ORAL | Status: AC
  Administered 2020-01-10: 500 mg via ORAL
  Filled 2020-01-10: qty 2

## 2020-01-10 NOTE — ED Triage Notes (Signed)
Pt reports numbness in r arm and leg x 1 week.  Denies other symptoms.

## 2020-01-10 NOTE — Clinical Social Work Note (Signed)
Transition of Care [facility]) - Emergency Department Mini Assessment  Patient Details  Name: Kathryn Morris MRN: [id number] Date of Birth: [birth year]  Transition of [facility]) CM/SW Contact:    Ewing Schlein, LCSW Phone Number: 01/10/2020, 2:48 PM  Clinical Narrative: Patient is a 44 year old female who presented to the ED for right arm and leg numbness. Per chart review, patient does not have insurance and a PCP is not listed. CSW spoke with patient to discuss resources. Per patient, she goes to [facility]. Patient reported she has requested insurance paperwork through Obamacare and is awaiting paperwork in the mail. Patient reported she has previously applied for Medicaid and been denied. TOC signing off.  ED Mini Assessment: What brought you to the Emergency Department? : Right left arm and leg numbness Barriers to Discharge: ED Barriers Resolved Barrier interventions: Discussed insurance options and PCP resources Means of departure: Car Interventions which prevented an admission or readmission: Patient counseling  Admission diagnosis:  RIGHT LEG/ARM NUMB X 1 WK,BP HIGH Patient Active Problem List   Diagnosis Date Noted  . Morbid obesity (HCC) 03/31/2015  . SVT (supraventricular tachycardia) (HCC) 03/26/2015  . DM2 (diabetes mellitus, type 2) (HCC) 03/26/2015  . HTN (hypertension) 03/26/2015   PCP:  System, Provider Not In Pharmacy:   [facility] [address] (N), [place] - [address] [place] (N) [place] [address] Phone: [phone] Fax: [phone]

## 2020-01-10 NOTE — Discharge Instructions (Addendum)
Your labs and x-rays today are reassuring.  Your hand exam suggest that you may have a condition called carpal tunnel syndrome, please refer to information about this below.  This can be improved by wearing the wrist splint you have been provided.  You may wear it during the day, but wearing it overnight can help to heal the suspected inflammation at your wrist joint that is causing pressure on the nerve that we suspect is the source of your hand symptoms.  The numbness in your thigh may be also a nerve pain.  Your x-rays of your lower back show minimal arthritis changes, but could be causing some nerve irritation.  Use the medication to help with pain and inflammation.  Plan to follow-up with Dr. Romeo Apple for further management of your symptoms.

## 2020-01-10 NOTE — ED Provider Notes (Signed)
[facility] EMERGENCY DEPARTMENT Provider Note   CSN: [id number] Arrival date & time: 01/10/20  0935     History Chief Complaint  Patient presents with  . Numbness    Kathryn Morris is a 44 y.o. female with a history of DM, HTN and SVT s/p ablation tx, presenting a one week history of right sided upper and lower extremity numbness and tingling without weakness.  She describes an isolated area of numbness of her right upper anterior thigh and reports numbness and tingling in her index, long and right fingers of her right hand.  Her pain is worsened when she sleeps on her right side - it wakes her, she rolls to the left and symptoms improve, but the right hand numbness has been persistent.  She denies weakness in her extremities including the right hand. 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  She denies any specific injury.   The history is provided by the patient.       Past Medical History:  Diagnosis Date  . Anemia   . Diabetes mellitus without complication (HCC)   . Hypertension   . SVT (supraventricular tachycardia) [facility])     Patient Active Problem List   Diagnosis Date Noted  . Morbid obesity (HCC) 03/31/2015  . SVT (supraventricular tachycardia) (HCC) 03/26/2015  . DM2 (diabetes mellitus, type 2) (HCC) 03/26/2015  . HTN (hypertension) 03/26/2015    Past Surgical History:  Procedure Laterality Date  . ELECTROPHYSIOLOGIC STUDY N/A 01/01/2016   Procedure: SVT Ablation;  Surgeon: [name], MD;  Location: [facility] INVASIVE CV LAB;  Service: Cardiovascular;  Laterality: N/A;  . NO PAST SURGERIES       OB History    Gravida  1   Para      Term      Preterm      AB  1   Living  0     SAB  1   TAB      Ectopic      Multiple      Live Births              Family History  Problem Relation Age of Onset  . Diabetes Mother   . Heart failure Mother   . Diabetes Sister      Social History   Tobacco Use  . Smoking status: Never Smoker  . Smokeless tobacco: Never Used  Vaping Use  . Vaping Use: Never used  Substance Use Topics  . Alcohol use: No  . Drug use: No    Home Medications Prior to Admission medications   Medication Sig Start Date End Date Taking? Authorizing Provider  aspirin EC 81 MG tablet Take 81 mg by mouth daily.     [provider]  Cyanocobalamin (VITAMIN B 12) 250 MCG LOZG Take by mouth.    [provider]  dexamethasone (DECADRON) 4 MG tablet Take 1 tablet (4 mg total) by mouth 2 (two) times daily with a meal. 07/23/18   Ivery Quale, PA-C  ferrous sulfate 325 (65 FE) MG tablet Take 325-650 mg by mouth 2 (two) times daily. Take 2 tablets by mouth in the AM and 1 tablet in the PM    [provider]  fluticasone (FLONASE) 50 MCG/ACT nasal spray Place 2 sprays into both nostrils daily. 11/12/17   Tommie Sams, DO  folic acid (FOLVITE) 400 MCG tablet  Take 400 mcg by mouth daily.    [provider]  hydrochlorothiazide (HYDRODIURIL) 25 MG tablet Take 25 mg by mouth daily as needed (fluid).    [provider]  HYDROcodone-homatropine (HYCODAN) 5-1.5 MG/5ML syrup Take 5 mLs by mouth every 6 (six) hours as needed. 07/23/18   Ivery QualeBryant, Hobson, PA-C  losartan (COZAAR) 100 MG tablet TAKE 1 TABLET BY MOUTH ONCE DAILY. **PLEASE SCHEDULE APPT FOR FURTHER REFILLS** 02/23/17   Allred, Fayrene FearingJames, MD  metFORMIN (GLUCOPHAGE) 1000 MG tablet Take 1,000 mg by mouth 2 (two) times daily with a meal.    [provider]  metoprolol succinate (TOPROL-XL) 50 MG 24 hr tablet TAKE 1 TABLET BY MOUTH ONCE DAILY 02/23/17   Allred, Fayrene FearingJames, MD  Multiple Vitamin (MULTIVITAMIN WITH MINERALS) TABS tablet Take 1 tablet by mouth daily.    [provider]  naproxen (NAPROSYN) 500 MG tablet Take 1 tablet (500 mg total) by mouth 2 (two) times daily. 01/10/20   Burgess AmorIdol, Khianna Blazina, PA-C  NOVOLIN 70/30 RELION (70-30) 100 UNIT/ML  injection  11/09/17   [provider]    Allergies    Lisinopril  Review of Systems   Review of Systems  Constitutional: Negative for chills and fever.  HENT: Negative for congestion and sore throat.   Eyes: Negative.   Respiratory: Negative for chest tightness and shortness of breath.   Cardiovascular: Negative for chest pain and leg swelling.  Gastrointestinal: Negative for abdominal distention, abdominal pain, constipation and nausea.  Genitourinary: Negative.  Negative for difficulty urinating, dysuria, flank pain, frequency and urgency.  Musculoskeletal: Negative for arthralgias, back pain, gait problem, joint swelling and neck pain.  Skin: Negative.  Negative for rash and wound.  Neurological: Positive for numbness. Negative for dizziness, tremors, syncope, speech difficulty, weakness, light-headedness and headaches.  Psychiatric/Behavioral: Negative.     Physical Exam Updated Vital Signs BP (!) 166/84 (BP Location: Right Arm)   Pulse 64   Temp 98.6 F (37 C) (Oral)   Resp 16   Ht 5\' 11"  (1.803 m)   Wt 135.2 kg   LMP 01/03/2020 Comment: neg preg test 01/10/20  SpO2 99%   BMI 41.56 kg/m   Physical Exam Vitals and nursing note reviewed.  Constitutional:      Appearance: She is well-developed.  HENT:     Head: Normocephalic and atraumatic.  Eyes:     Conjunctiva/sclera: Conjunctivae normal.  Cardiovascular:     Rate and Rhythm: Normal rate and regular rhythm.     Heart sounds: Normal heart sounds.  Pulmonary:     Effort: Pulmonary effort is normal.     Breath sounds: Normal breath sounds. No wheezing.  Abdominal:     General: Bowel sounds are normal.     Palpations: Abdomen is soft.     Tenderness: There is no abdominal tenderness.  Musculoskeletal:        General: Tenderness present. No swelling or deformity. Normal range of motion.     Cervical back: Normal range of motion and neck supple. Tenderness present. No edema or rigidity. Normal range of  motion.     Thoracic back: Normal.     Lumbar back: Tenderness present. No edema. Normal range of motion.     Comments: There is mild midline tenderness cervical and lumbar spine radiating into the right paracervical and paralumbar soft tissues.   Skin:    General: Skin is warm and dry.  Neurological:     Mental Status: She is alert.  Motor: Motor function is intact.     Coordination: Heel to [facility] Test normal. Rapid alternating movements normal.     Gait: Gait is intact.     Deep Tendon Reflexes:     Reflex Scores:      Bicep reflexes are 2+ on the right side and 2+ on the left side.      Patellar reflexes are 2+ on the right side and 2+ on the left side.    Comments: Equal grip strength. Decreased sensation to fine touch anterior anterior L2 distribution but not involving lateral or posterior distribution.   Positive Phalens test of right hand.      ED Results / Procedures / Treatments   Labs (all labs ordered are listed, but only abnormal results are displayed) Labs Reviewed  CBC - Abnormal; Notable for the following components:      Result Value   RBC 3.75 (*)    Hemoglobin 11.1 (*)    HCT 35.3 (*)    All other components within normal limits  COMPREHENSIVE METABOLIC PANEL - Abnormal; Notable for the following components:   Glucose, Bld 279 (*)    All other components within normal limits  CBG MONITORING, ED - Abnormal; Notable for the following components:   Glucose-Capillary 260 (*)    All other components within normal limits  PROTIME-INR  APTT  DIFFERENTIAL  CBG MONITORING, ED  POC URINE PREG, ED  POC URINE PREG, ED    EKG EKG Interpretation  Date/Time:  Tuesday January 10 2020 11:07:58 EDT Ventricular Rate:  65 PR Interval:    QRS Duration: 86 QT Interval:  392 QTC Calculation: 408 R Axis:   14 Text Interpretation: Sinus rhythm No STEMI Confirmed by Alvester Chou [phone]) on 01/10/2020 1:08:38 PM   Radiology DG Cervical Spine Complete  Result Date:  01/10/2020 CLINICAL DATA:  Right arm numbness. EXAM: CERVICAL SPINE - COMPLETE 4+ VIEW COMPARISON:  None. FINDINGS: There is no evidence of cervical spine fracture or prevertebral soft tissue swelling. Alignment is normal. No other significant bone abnormalities are identified. IMPRESSION: Negative cervical spine radiographs. Electronically Signed   By: Kennith Center M.D.   On: 01/10/2020 13:40   DG Lumbar Spine Complete  Result Date: 01/10/2020 CLINICAL DATA:  Numbness in the right arm and leg for 1 week. EXAM: LUMBAR SPINE - COMPLETE 4+ VIEW COMPARISON:  None. FINDINGS: There are 5 lumbar type vertebral bodies. The alignment is normal. The disc spaces are preserved. There is minimal intervertebral spurring and facet disease. No evidence of fracture or pars defect. The sacroiliac joints appear normal. IMPRESSION: No acute osseous findings or malalignment. Minimal intervertebral spurring and facet disease. Electronically Signed   By: Carey Bullocks M.D.   On: 01/10/2020 13:40   CT HEAD WO CONTRAST  Result Date: 01/10/2020 CLINICAL DATA:  Right arm and leg numbness EXAM: CT HEAD WITHOUT CONTRAST TECHNIQUE: Contiguous axial images were obtained from the base of the skull through the vertex without intravenous contrast. COMPARISON:  2017 FINDINGS: Brain: There is no acute intracranial hemorrhage, mass effect, or edema. Gray-white differentiation is preserved. There is no extra-axial fluid collection. Ventricles and sulci are within normal limits in size and configuration. Vascular: No hyperdense vessel or unexpected calcification. Skull: Calvarium is unremarkable. Sinuses/Orbits: No acute finding. Other: None. IMPRESSION: No acute intracranial hemorrhage, mass effect, or evidence of acute infarction. Electronically Signed   By: Guadlupe Spanish M.D.   On: 01/10/2020 11:04    Procedures Procedures (including critical care time)  Medications Ordered in ED Medications  naproxen (NAPROSYN) tablet 500 mg (500  mg Oral Given 01/10/20 1521)    ED Course  I have reviewed the triage vital signs and the nursing notes.  Pertinent labs & imaging results that were available during my care of the patient were reviewed by me and considered in my medical decision making (see chart for details).    MDM Rules/Calculators/A&P                          Suspected carpal tunnel syndrome right wrist given exam and history findings.  She was placed in a velcro wrist splint for night time use, advised can but does not have to wear during the day as well.  Discussed role of ice/heat tx.  Naproxen.  Plan f/u with ortho for further management of sx.  Upper thigh numbness possibly also radicular although minimal spurring on lumbar imaging.  She has no weakness or other concerning neuro deficits.  This is not cauda equina, this is not a stroke.  Also doubt diabetic neuropathy given distribution of sx.  Referral to ortho for f/u care.     Final Clinical Impression(s) / ED Diagnoses Final diagnoses:  Lumbar radiculopathy  Carpal tunnel syndrome of right wrist    Rx / DC Orders ED Discharge Orders         Ordered    naproxen (NAPROSYN) 500 MG tablet  2 times daily     Discontinue  Reprint     01/10/20 1450           Burgess Amor, PA-C 01/12/20 1017    Terald Sleeper, MD 01/13/20 1100

## 2020-02-13 ENCOUNTER — Encounter (HOSPITAL_COMMUNITY): Payer: Self-pay | Admitting: *Deleted

## 2020-02-13 ENCOUNTER — Emergency Department (HOSPITAL_COMMUNITY): Payer: Self-pay

## 2020-02-13 ENCOUNTER — Other Ambulatory Visit: Payer: Self-pay

## 2020-02-13 ENCOUNTER — Emergency Department (HOSPITAL_COMMUNITY)
Admission: EM | Admit: 2020-02-13 | Discharge: 2020-02-13 | Disposition: A | Discharge status: Home / Self Care | Payer: Self-pay | Attending: Emergency Medicine | Admitting: Emergency Medicine

## 2020-02-13 DIAGNOSIS — R197 Diarrhea, unspecified: Secondary | ICD-10-CM | POA: Insufficient documentation

## 2020-02-13 DIAGNOSIS — R110 Nausea: Secondary | ICD-10-CM | POA: Insufficient documentation

## 2020-02-13 DIAGNOSIS — R350 Frequency of micturition: Secondary | ICD-10-CM | POA: Insufficient documentation

## 2020-02-13 DIAGNOSIS — R0602 Shortness of breath: Secondary | ICD-10-CM | POA: Insufficient documentation

## 2020-02-13 DIAGNOSIS — R600 Localized edema: Secondary | ICD-10-CM | POA: Insufficient documentation

## 2020-02-13 DIAGNOSIS — R002 Palpitations: Secondary | ICD-10-CM

## 2020-02-13 DIAGNOSIS — Z7984 Long term (current) use of oral hypoglycemic drugs: Secondary | ICD-10-CM | POA: Insufficient documentation

## 2020-02-13 DIAGNOSIS — M549 Dorsalgia, unspecified: Secondary | ICD-10-CM | POA: Insufficient documentation

## 2020-02-13 DIAGNOSIS — E669 Obesity, unspecified: Secondary | ICD-10-CM | POA: Insufficient documentation

## 2020-02-13 DIAGNOSIS — I10 Essential (primary) hypertension: Secondary | ICD-10-CM | POA: Insufficient documentation

## 2020-02-13 DIAGNOSIS — R0789 Other chest pain: Secondary | ICD-10-CM

## 2020-02-13 DIAGNOSIS — Z79899 Other long term (current) drug therapy: Secondary | ICD-10-CM | POA: Insufficient documentation

## 2020-02-13 DIAGNOSIS — Z7982 Long term (current) use of aspirin: Secondary | ICD-10-CM | POA: Insufficient documentation

## 2020-02-13 DIAGNOSIS — E119 Type 2 diabetes mellitus without complications: Secondary | ICD-10-CM | POA: Insufficient documentation

## 2020-02-13 LAB — BASIC METABOLIC PANEL
Anion gap: 7 (ref 5–15)
BUN: 9 mg/dL (ref 6–20)
CO2: 27 mmol/L (ref 22–32)
Calcium: 9.1 mg/dL (ref 8.9–10.3)
Chloride: 100 mmol/L (ref 98–111)
Creatinine, Ser: 0.74 mg/dL (ref 0.44–1.00)
GFR calc Af Amer: >60 mL/min (ref >60)
GFR calc non Af Amer: >60 mL/min (ref >60)
Glucose, Bld: 205 mg/dL — ABNORMAL HIGH (ref 70–99)
Potassium: 3.7 mmol/L (ref 3.5–5.1)
Sodium: 134 mmol/L — ABNORMAL LOW (ref 135–145)

## 2020-02-13 LAB — CBC
HCT: 35.9 % — ABNORMAL LOW (ref 36.0–46.0)
Hemoglobin: 10.9 g/dL — ABNORMAL LOW (ref 12.0–15.0)
MCH: 28.5 pg (ref 26.0–34.0)
MCHC: 30.4 g/dL (ref 30.0–36.0)
MCV: 93.7 fL (ref 80.0–100.0)
Platelets: 416 10*3/uL — ABNORMAL HIGH (ref 150–400)
RBC: 3.83 MIL/uL — ABNORMAL LOW (ref 3.87–5.11)
RDW: 14.7 % (ref 11.5–15.5)
WBC: 8.1 10*3/uL (ref 4.0–10.5)
nRBC: 0 % (ref 0.0–0.2)

## 2020-02-13 LAB — TROPONIN I (HIGH SENSITIVITY)
Troponin I (High Sensitivity): 4 ng/L (ref <18)
Troponin I (High Sensitivity): 4 ng/L (ref <18)

## 2020-02-13 IMAGING — DX DG CHEST 2V
2 series · 2 of 2 positions shown · non-contrast
Comparison: Chest radiograph dated [DATE]

CLINICAL DATA: 44-year-old female with shortness of breath.

EXAM:
CHEST - 2 VIEW

[chest pa]
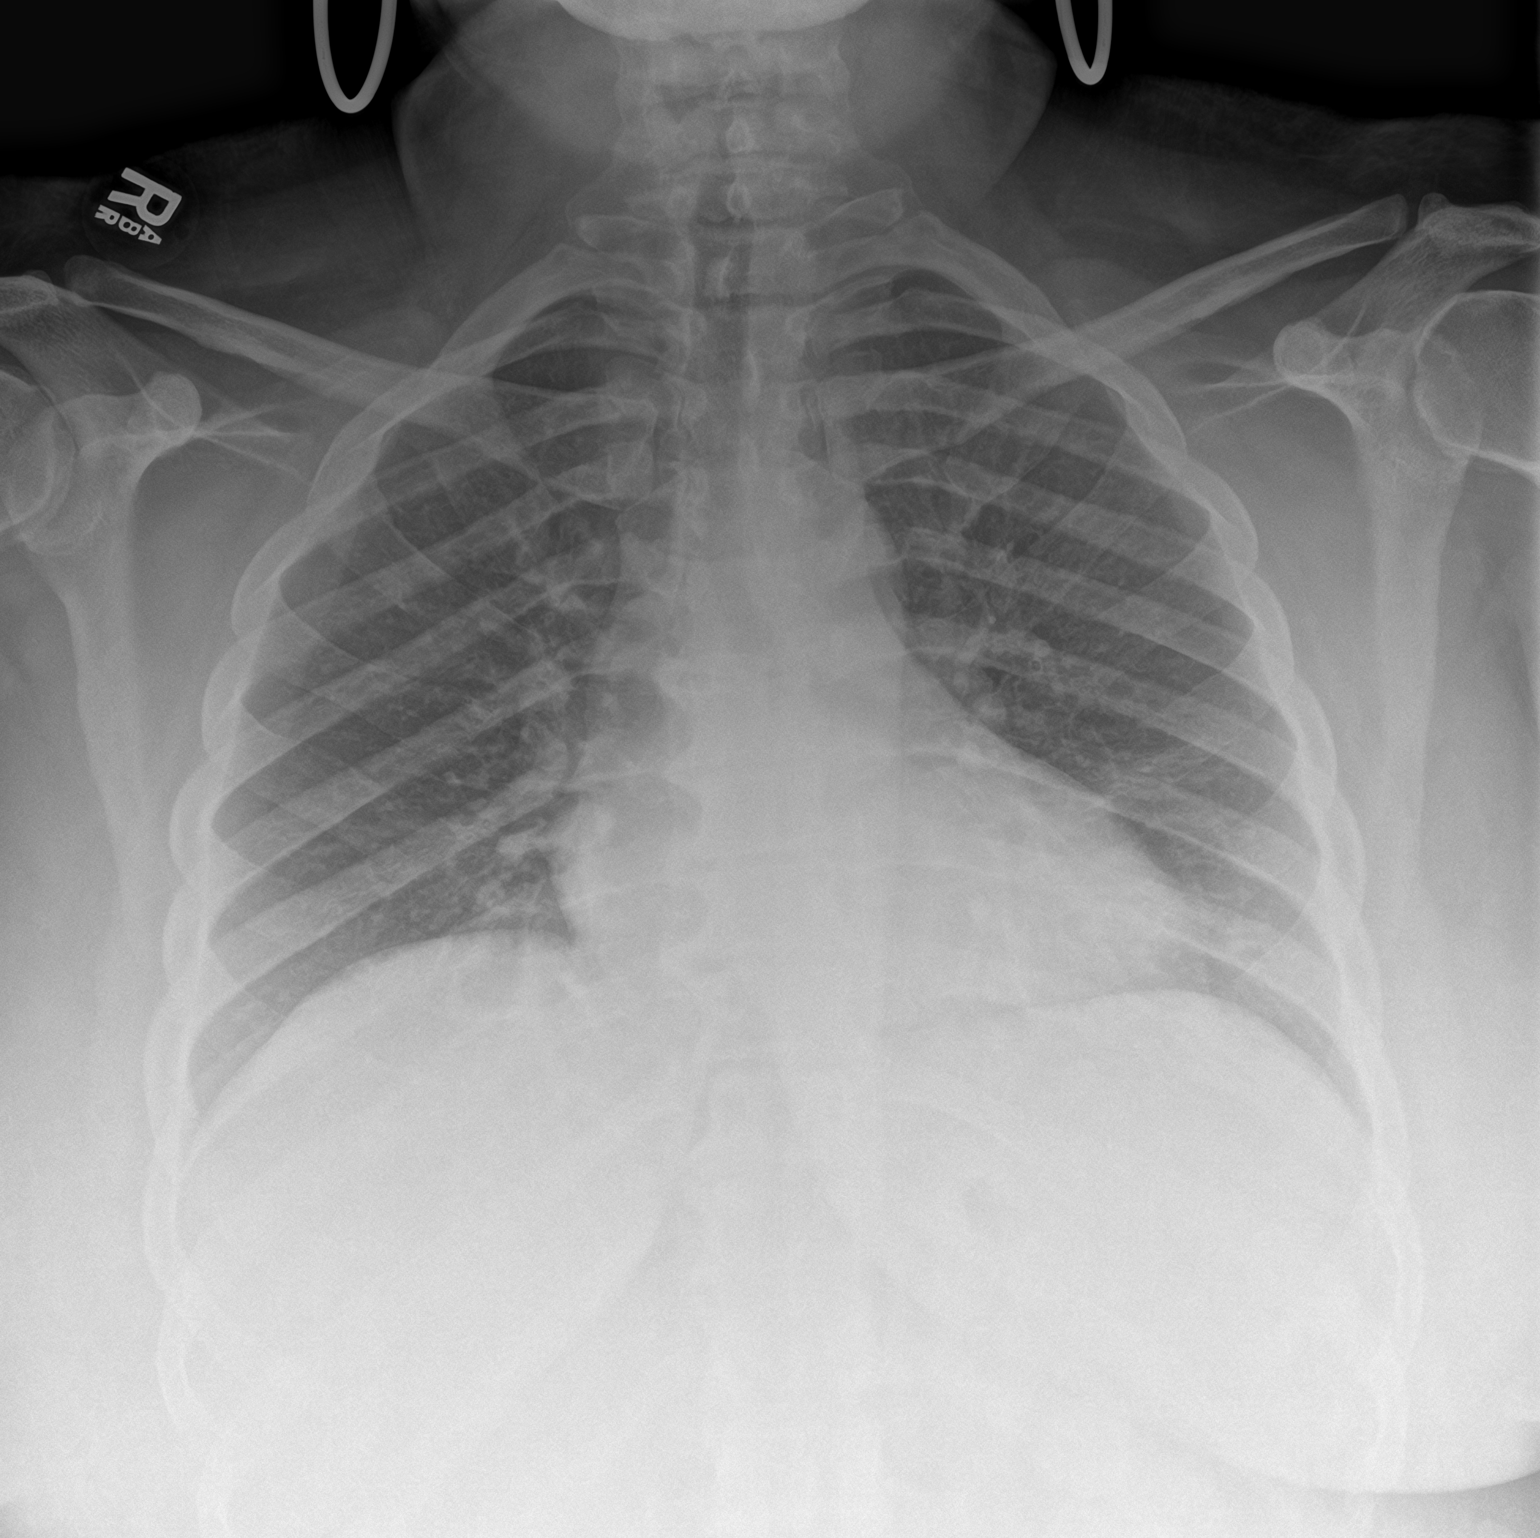

[chest lat]
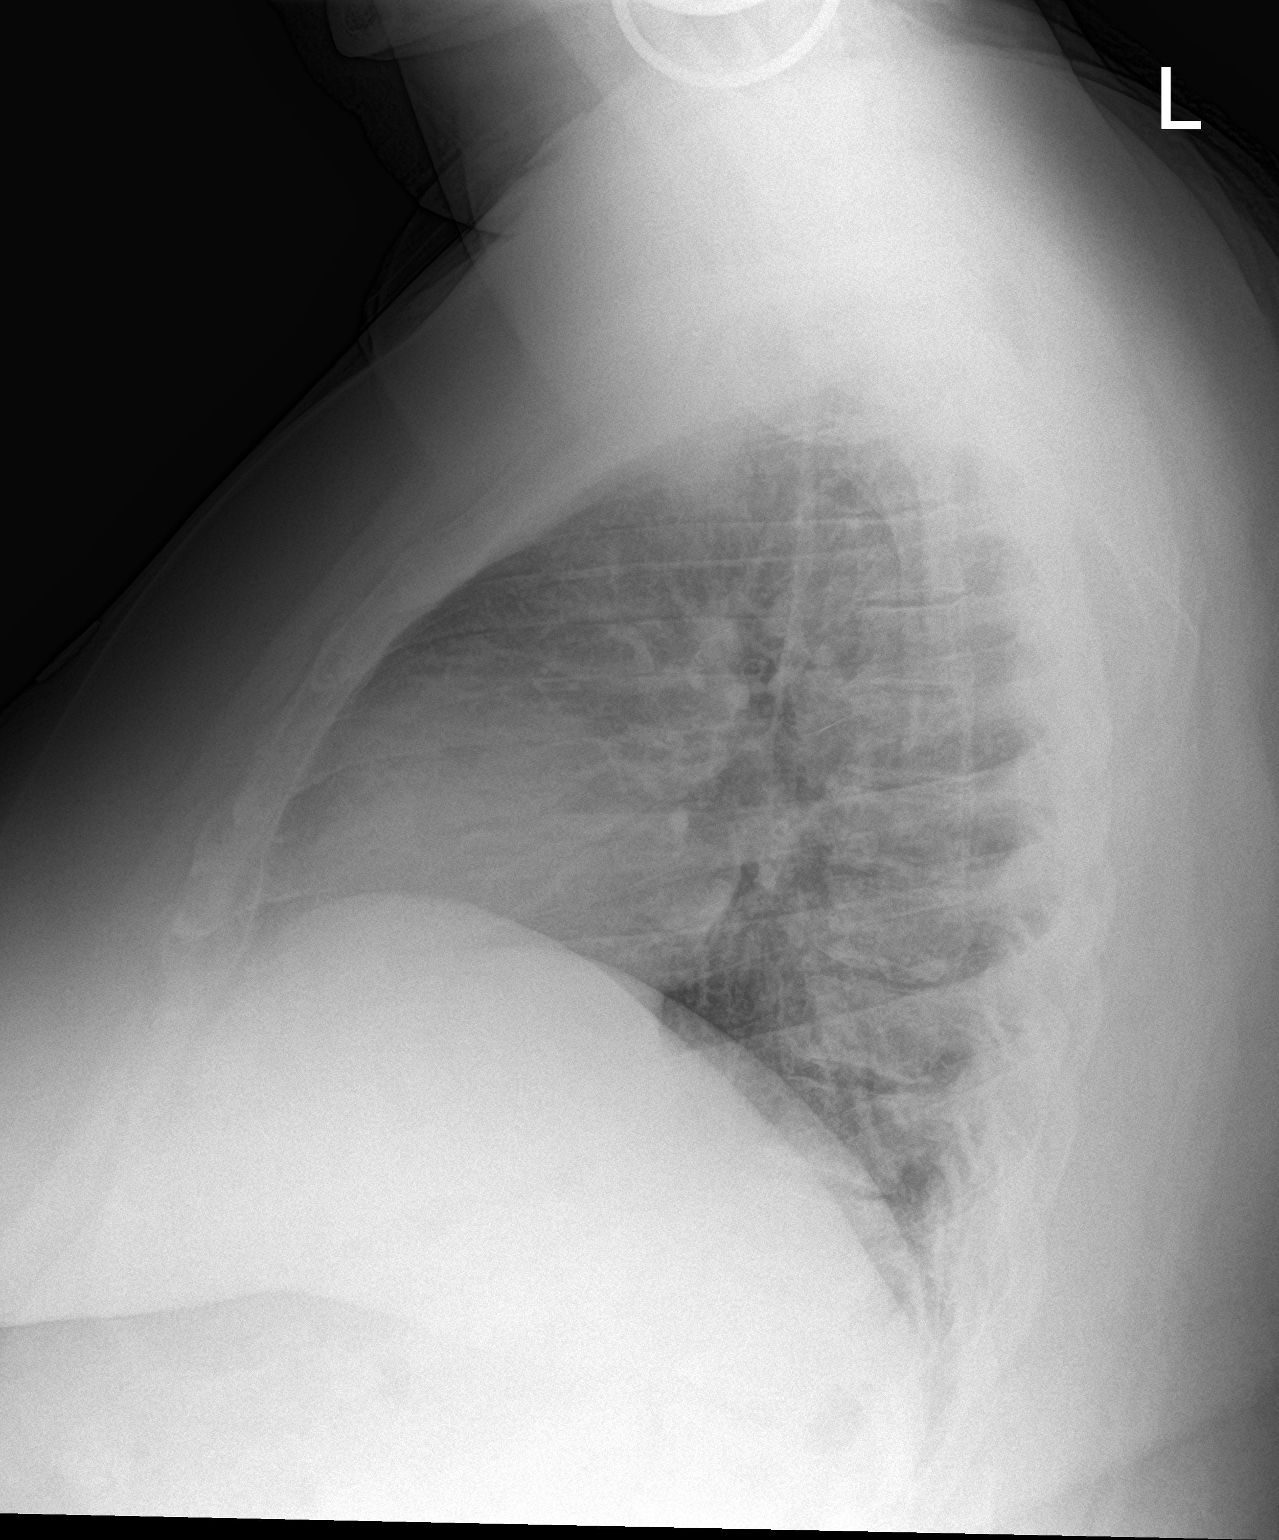

[2 of 2 positions shown; findings below may reference images not displayed]

FINDINGS: The heart size and mediastinal contours are within normal limits.
Both lungs are clear. The visualized skeletal structures are
unremarkable.
IMPRESSION: No active cardiopulmonary disease.

## 2020-02-13 MED ORDER — SODIUM CHLORIDE 0.9% FLUSH: 3.0000 mL | Freq: Once | INTRAVENOUS | Status: DC

## 2020-02-13 NOTE — ED Triage Notes (Signed)
Shortness of breath onset today, history of palpitations

## 2020-02-13 NOTE — ED Provider Notes (Signed)
[facility] EMERGENCY DEPARTMENT Provider Note   CSN: [id number] Arrival date & time: 02/13/20  1655     History Chief Complaint  Patient presents with  . Palpitations  . Chest Pain    Kathryn Morris is a 44 y.o. female with hx of SVT s/p ablation, HTN, obesity, DM who presents with palpitations. She states that she was sitting at her desk this afternoon after eating lunch and had an acute onset of palpitations. She took an ASA and she felt it go to her stomach and she had a discomfort there. She then had a sudden urge to have a BM and when she went to the bathroom she was having diarrhea. She went home but just didn't feel well. She continued to have intermittent palpations, central chest and back tightness with some SOB and nausea. She feels somewhat anxious and is unsure if she had a panic attack but states she does not have a particular reason to have a panic attack. She states with her SVT she would have palpitations and SOB and it wouldn't go away. She took a metoprolol PTA and since she has been here her symptoms have been improving. She denies any recent fever, chills, cough, URI symptoms, vomiting, dysuria. No recent surgery/travel/immobilization, hx of cancer, unilateral leg swelling, hemoptysis, prior DVT/PE, or hormone use. She does have swelling in her ankles chronically which improves with elevation. She states her mother had a heart attack in her 68s and she died of CHF at age 20.   HPI     Past Medical History:  Diagnosis Date  . Anemia   . Diabetes mellitus without complication (HCC)   . Hypertension   . SVT (supraventricular tachycardia) [facility])     Patient Active Problem List   Diagnosis Date Noted  . Morbid obesity (HCC) 03/31/2015  . SVT (supraventricular tachycardia) (HCC) 03/26/2015  . DM2 (diabetes mellitus, type 2) (HCC) 03/26/2015  . HTN (hypertension) 03/26/2015    Past Surgical History:  Procedure Laterality Date  . ELECTROPHYSIOLOGIC STUDY N/A  01/01/2016   Procedure: SVT Ablation;  Surgeon: [name], MD;  Location: [facility] INVASIVE CV LAB;  Service: Cardiovascular;  Laterality: N/A;  . NO PAST SURGERIES       OB History    Gravida  1   Para      Term      Preterm      AB  1   Living  0     SAB  1   TAB      Ectopic      Multiple      Live Births              Family History  Problem Relation Age of Onset  . Diabetes Mother   . Heart failure Mother   . Diabetes Sister     Social History   Tobacco Use  . Smoking status: Never Smoker  . Smokeless tobacco: Never Used  Vaping Use  . Vaping Use: Never used  Substance Use Topics  . Alcohol use: No  . Drug use: No    Home Medications Prior to Admission medications   Medication Sig Start Date End Date Taking? Authorizing Provider  aspirin EC 81 MG tablet Take 81 mg by mouth daily.     [provider]  Cyanocobalamin (VITAMIN B 12) 250 MCG LOZG Take by mouth.    [provider]  dexamethasone (DECADRON) 4 MG tablet Take 1 tablet (4 mg total) by mouth  2 (two) times daily with a meal. 07/23/18   Ivery Quale, PA-C  ferrous sulfate 325 (65 FE) MG tablet Take 325-650 mg by mouth 2 (two) times daily. Take 2 tablets by mouth in the AM and 1 tablet in the PM    [provider]  fluticasone (FLONASE) 50 MCG/ACT nasal spray Place 2 sprays into both nostrils daily. 11/12/17   Tommie Sams, DO  folic acid (FOLVITE) 400 MCG tablet Take 400 mcg by mouth daily.    [provider]  hydrochlorothiazide (HYDRODIURIL) 25 MG tablet Take 25 mg by mouth daily as needed (fluid).    [provider]  HYDROcodone-homatropine (HYCODAN) 5-1.5 MG/5ML syrup Take 5 mLs by mouth every 6 (six) hours as needed. 07/23/18   Ivery Quale, PA-C  losartan (COZAAR) 100 MG tablet TAKE 1 TABLET BY MOUTH ONCE DAILY. **PLEASE SCHEDULE APPT FOR FURTHER REFILLS** 02/23/17   Allred, Fayrene Fearing, MD  metFORMIN (GLUCOPHAGE) 1000 MG tablet Take 1,000 mg by mouth 2  (two) times daily with a meal.    [provider]  metoprolol succinate (TOPROL-XL) 50 MG 24 hr tablet TAKE 1 TABLET BY MOUTH ONCE DAILY 02/23/17   Allred, Fayrene Fearing, MD  Multiple Vitamin (MULTIVITAMIN WITH MINERALS) TABS tablet Take 1 tablet by mouth daily.    [provider]  naproxen (NAPROSYN) 500 MG tablet Take 1 tablet (500 mg total) by mouth 2 (two) times daily. 01/10/20   Burgess Amor, PA-C  NOVOLIN 70/30 RELION (70-30) 100 UNIT/ML injection  11/09/17   [provider]    Allergies    Lisinopril  Review of Systems   Review of Systems  Constitutional: Negative for chills and fever.  Respiratory: Positive for chest tightness and shortness of breath. Negative for cough and wheezing.   Cardiovascular: Positive for palpitations. Negative for chest pain and leg swelling.  Gastrointestinal: Negative for abdominal pain, diarrhea, nausea and vomiting.  Genitourinary: Positive for frequency. Negative for dysuria.  Musculoskeletal: Positive for back pain.  Neurological: Negative for syncope.  Psychiatric/Behavioral: The patient is nervous/anxious.   All other systems reviewed and are negative.   Physical Exam Updated Vital Signs BP (!) 169/94 (BP Location: Right Arm)   Pulse 67   Temp 98.5 F (36.9 C) (Oral)   Resp 16   Ht 5\' 11"  (1.803 m)   Wt (!) 138.8 kg   LMP 02/05/2020   SpO2 100%   BMI 42.68 kg/m   Physical Exam Vitals and nursing note reviewed.  Constitutional:      General: She is not in acute distress.    Appearance: She is well-developed. She is obese. She is not ill-appearing.     Comments: Calm, cooperative. NAD  HENT:     Head: Normocephalic and atraumatic.  Eyes:     General: No scleral icterus.       Right eye: No discharge.        Left eye: No discharge.     Conjunctiva/sclera: Conjunctivae normal.     Pupils: Pupils are equal, round, and reactive to light.  Cardiovascular:     Rate and Rhythm: Normal rate and regular rhythm.    Pulmonary:     Effort: Pulmonary effort is normal. No respiratory distress.     Breath sounds: Normal breath sounds.  Abdominal:     General: There is no distension.     Palpations: Abdomen is soft.     Tenderness: There is no abdominal tenderness.  Musculoskeletal:     Cervical back: Normal  range of motion.     Right lower leg: No edema.     Left lower leg: No edema.     Comments: Mild edema of the ankles bilaterally. No calf tenderness or swelling  Skin:    General: Skin is warm and dry.  Neurological:     Mental Status: She is alert and oriented to person, place, and time.  Psychiatric:        Behavior: Behavior normal.     ED Results / Procedures / Treatments   Labs (all labs ordered are listed, but only abnormal results are displayed) Labs Reviewed  BASIC METABOLIC PANEL - Abnormal; Notable for the following components:      Result Value   Sodium 134 (*)    Glucose, Bld 205 (*)    All other components within normal limits  CBC - Abnormal; Notable for the following components:   RBC 3.83 (*)    Hemoglobin 10.9 (*)    HCT 35.9 (*)    Platelets 416 (*)    All other components within normal limits  POC URINE PREG, ED  TROPONIN I (HIGH SENSITIVITY)  TROPONIN I (HIGH SENSITIVITY)    EKG EKG Interpretation  Date/Time:  Monday February 13 2020 17:27:48 EDT Ventricular Rate:  66 PR Interval:  162 QRS Duration: 86 QT Interval:  396 QTC Calculation: 415 R Axis:   -5 Text Interpretation: Normal sinus rhythm with sinus arrhythmia Moderate voltage criteria for LVH, may be normal variant ( R in aVL , Cornell product ) Nonspecific ST abnormality Abnormal ECG Confirmed by Marianna Fuss ([id number]) on 02/13/2020 6:56:33 PM   Radiology DG Chest 2 View  Result Date: 02/13/2020 CLINICAL DATA:  44 year old female with shortness of breath. EXAM: CHEST - 2 VIEW COMPARISON:  Chest radiograph dated 12/11/2018 FINDINGS: The heart size and mediastinal contours are within normal limits.  Both lungs are clear. The visualized skeletal structures are unremarkable. IMPRESSION: No active cardiopulmonary disease. Electronically Signed   By: Elgie Collard M.D.   On: 02/13/2020 18:52    Procedures Procedures (including critical care time)  Medications Ordered in ED Medications  sodium chloride flush (NS) 0.9 % injection 3 mL (has no administration in time range)    ED Course  I have reviewed the triage vital signs and the nursing notes.  Pertinent labs & imaging results that were available during my care of the patient were reviewed by me and considered in my medical decision making (see chart for details).  44 year old female with acute onset of palpitations, chest tightness, back pain, SOB, nausea, diarrhea that occurred today. EKG is SR. CXR is negative. Initial and second troponin are 4. Labs are remarkable for mild anemia which is at baseline and hyperglycemia (205). HEART score is 2. She is PERC negative. Unclear etiology of her symptoms today. DDx includes acute GI illness, anxiety, SVT. Pt is overall well appearing and with reassuring work up she was advised to f/u with cardiology. She verbalized understanding.   MDM Rules/Calculators/A&P                           Final Clinical Impression(s) / ED Diagnoses Final diagnoses:  Palpitations  Feeling of chest tightness    Rx / DC Orders ED Discharge Orders    None       Bethel Born, PA-C 02/13/20 2053    Milagros Loll, MD 02/16/20 0005

## 2020-02-13 NOTE — Discharge Instructions (Addendum)
You EKG, chest xray, and blood work all looked overall reassuring today Please rest and stay hydrated Avoid excessive caffeine use Please make a follow up visit with Dr. Johney Frame Return to the ER if you are worsening

## 2021-04-30 ENCOUNTER — Emergency Department (HOSPITAL_COMMUNITY)
Admission: EM | Admit: 2021-04-30 | Discharge: 2021-04-30 | Disposition: A | Discharge status: Home / Self Care | Payer: Medicaid Other | Attending: Emergency Medicine | Admitting: Emergency Medicine

## 2021-04-30 ENCOUNTER — Encounter (HOSPITAL_COMMUNITY): Payer: Self-pay | Admitting: *Deleted

## 2021-04-30 ENCOUNTER — Emergency Department (HOSPITAL_COMMUNITY): Payer: Medicaid Other

## 2021-04-30 DIAGNOSIS — Z794 Long term (current) use of insulin: Secondary | ICD-10-CM | POA: Insufficient documentation

## 2021-04-30 DIAGNOSIS — Z79899 Other long term (current) drug therapy: Secondary | ICD-10-CM | POA: Insufficient documentation

## 2021-04-30 DIAGNOSIS — I10 Essential (primary) hypertension: Secondary | ICD-10-CM | POA: Insufficient documentation

## 2021-04-30 DIAGNOSIS — E119 Type 2 diabetes mellitus without complications: Secondary | ICD-10-CM | POA: Insufficient documentation

## 2021-04-30 DIAGNOSIS — Z7984 Long term (current) use of oral hypoglycemic drugs: Secondary | ICD-10-CM | POA: Insufficient documentation

## 2021-04-30 DIAGNOSIS — Z7982 Long term (current) use of aspirin: Secondary | ICD-10-CM | POA: Insufficient documentation

## 2021-04-30 LAB — CBC
HCT: 39.3 % (ref 36.0–46.0)
Hemoglobin: 12.5 g/dL (ref 12.0–15.0)
MCH: 29.3 pg (ref 26.0–34.0)
MCHC: 31.8 g/dL (ref 30.0–36.0)
MCV: 92.3 fL (ref 80.0–100.0)
Platelets: 385 10*3/uL (ref 150–400)
RBC: 4.26 MIL/uL (ref 3.87–5.11)
RDW: 14.1 % (ref 11.5–15.5)
WBC: 7.3 10*3/uL (ref 4.0–10.5)
nRBC: 0 % (ref 0.0–0.2)

## 2021-04-30 LAB — BASIC METABOLIC PANEL
Anion gap: 10 (ref 5–15)
BUN: 6 mg/dL (ref 6–20)
CO2: 27 mmol/L (ref 22–32)
Calcium: 9.4 mg/dL (ref 8.9–10.3)
Chloride: 99 mmol/L (ref 98–111)
Creatinine, Ser: 0.73 mg/dL (ref 0.44–1.00)
GFR, Estimated: >60 mL/min (ref >60)
Glucose, Bld: 221 mg/dL — ABNORMAL HIGH (ref 70–99)
Potassium: 3.8 mmol/L (ref 3.5–5.1)
Sodium: 136 mmol/L (ref 135–145)

## 2021-04-30 LAB — TROPONIN I (HIGH SENSITIVITY): Troponin I (High Sensitivity): 6 ng/L (ref <18)

## 2021-04-30 IMAGING — CR DG CHEST 2V
2 series · 2 of 2 positions shown · non-contrast
Comparison: [DATE]

CLINICAL DATA: Dizziness.  Hypertension.

EXAM:
CHEST - 2 VIEW

[w pa chest]
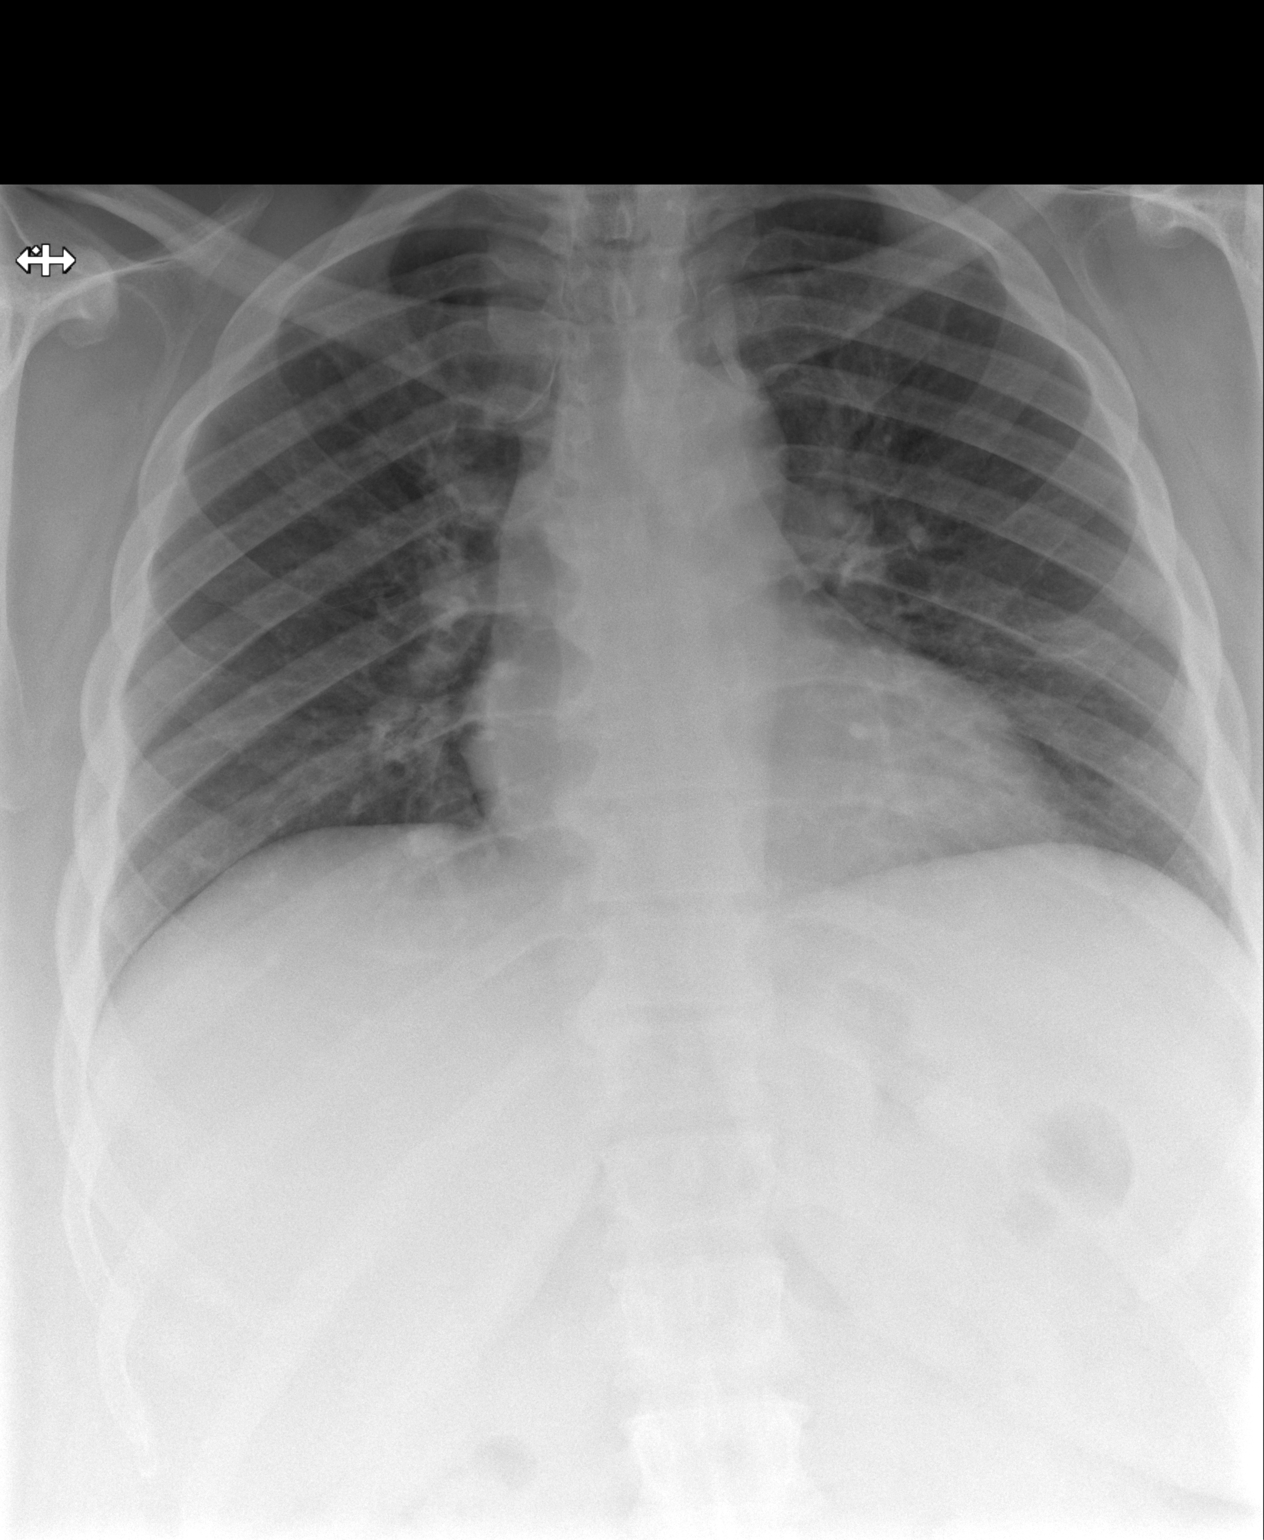

[w chest lat]
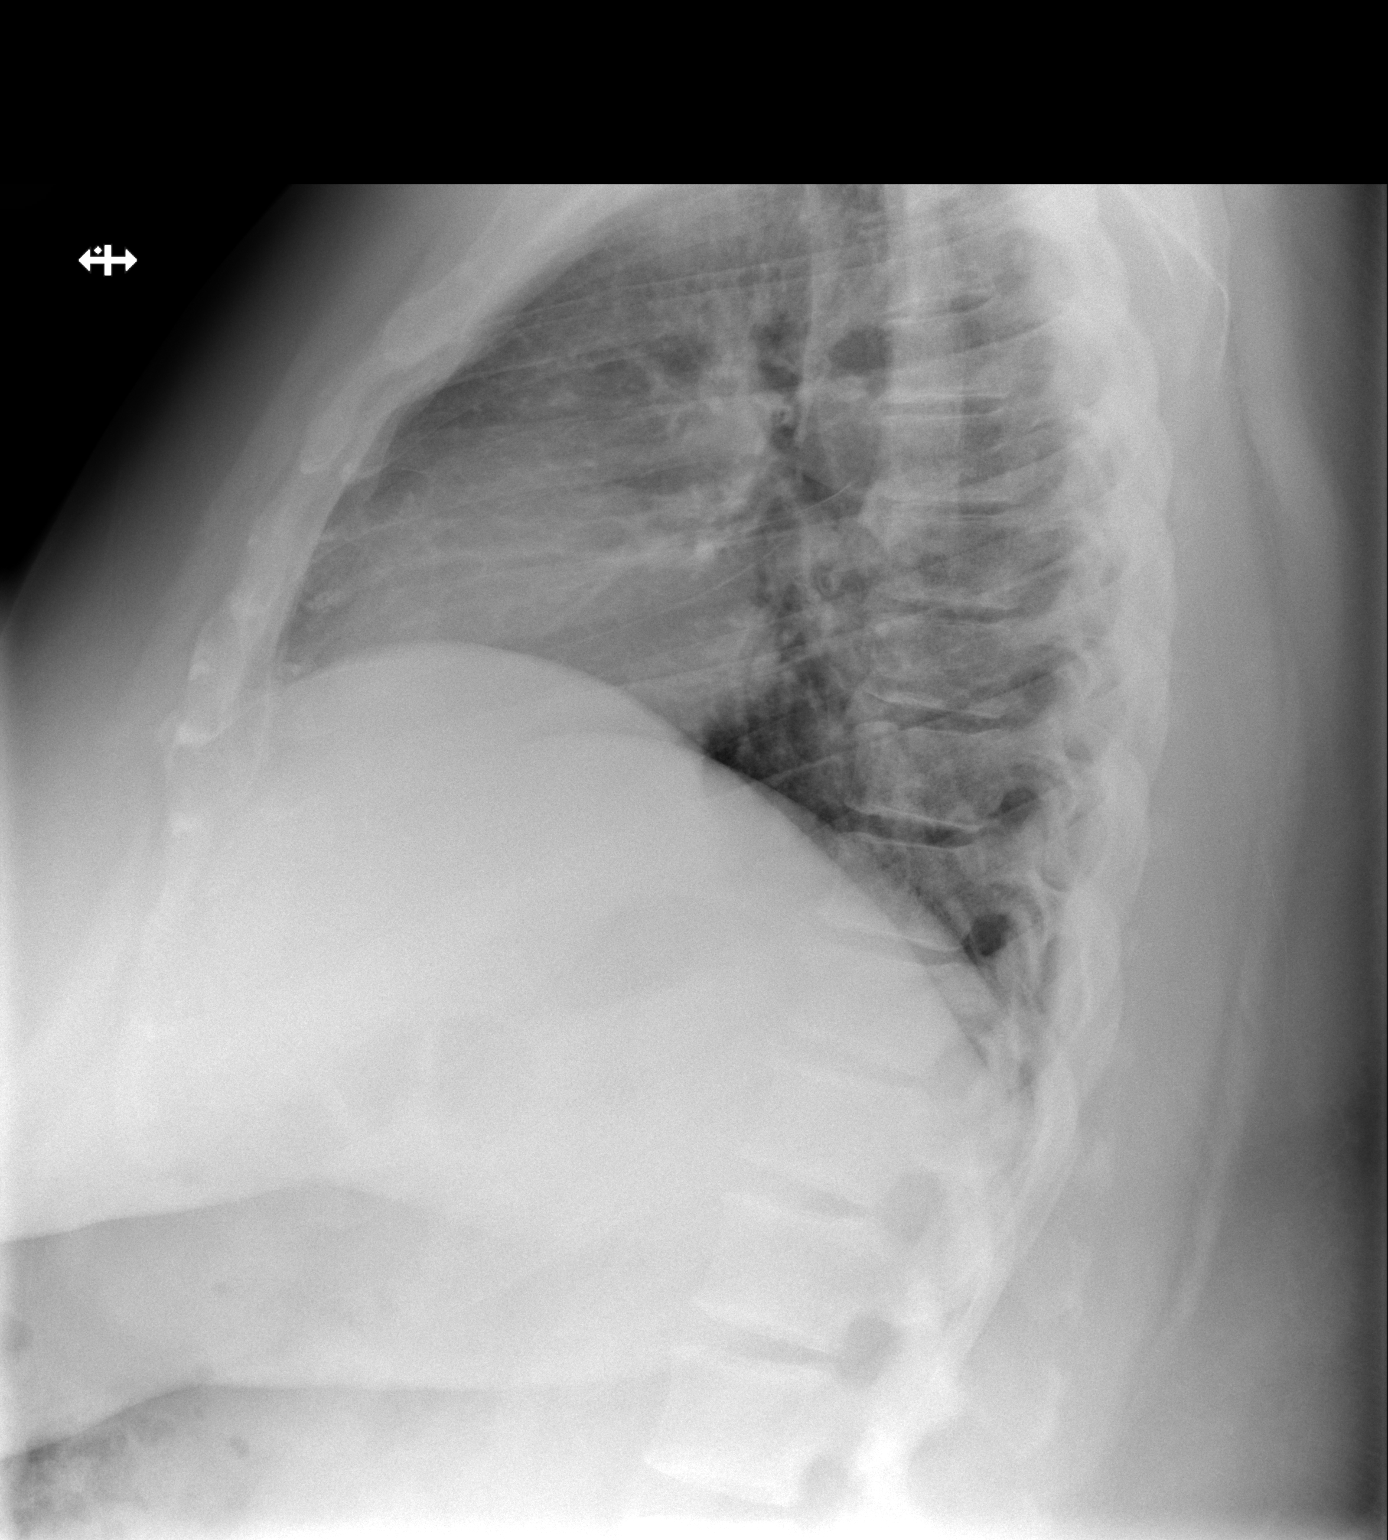

[2 of 2 positions shown; findings below may reference images not displayed]

FINDINGS: The heart size and mediastinal contours are within normal limits.
Both lungs are clear. The visualized skeletal structures are
unremarkable.
IMPRESSION: No active cardiopulmonary disease.

## 2021-04-30 MED ORDER — AMLODIPINE BESYLATE 10 MG PO TABS: 10.0000 mg | ORAL_TABLET | Freq: Every day | ORAL | 0 refills | Status: DC

## 2021-04-30 MED ORDER — AMLODIPINE BESYLATE 5 MG PO TABS
5.0000 mg | ORAL_TABLET | Freq: Once | ORAL | Status: AC
  Administered 2021-04-30: 5 mg via ORAL
  Filled 2021-04-30: qty 1

## 2021-04-30 NOTE — ED Triage Notes (Signed)
States he blood pressure has ben elevated today

## 2021-04-30 NOTE — ED Provider Notes (Signed)
[facility] EMERGENCY DEPARTMENT Provider Note   CSN: [id number] Arrival date & time: 04/30/21  1349     History Chief Complaint  Patient presents with   Hypertension    Kathryn Morris is a 45 y.o. female.   Hypertension  This patient is a 45 year old female who goes to the [place] [facility] in [facility] for her care, she is a diabetic has hypertension and has SVT status post ablation.  She is currently on metoprolol once a day and losartan once a day, she had been taken off of her hydrochlorothiazide recently and switched over to spironolactone but did not tolerate it.  Today she forgot to take her morning medications, she got to work and had some breakfast and then decided to take them later because she was feeling lightheaded.  Her blood pressure was measured at well over 200 systolic, she continued to be lightheaded until she vomited, she decided to come to the hospital after that.  At this time she is completely asymptomatic.  She denies having any other symptoms including chest pain coughing shortness of breath fever swelling of the legs and has had no diarrhea or blood in the stool.  She has no blurred vision, no weakness or numbness.  She has not missed any other doses of her medicines and has had her morning medicines she just took them midmorning because she forgot them this morning.      Past Medical History:  Diagnosis Date   Anemia    Diabetes mellitus without complication (HCC)    Hypertension    SVT (supraventricular tachycardia) [facility])     Patient Active Problem List   Diagnosis Date Noted   Morbid obesity (HCC) 03/31/2015   SVT (supraventricular tachycardia) (HCC) 03/26/2015   DM2 (diabetes mellitus, type 2) (HCC) 03/26/2015   HTN (hypertension) 03/26/2015    Past Surgical History:  Procedure Laterality Date   ELECTROPHYSIOLOGIC STUDY N/A 01/01/2016   Procedure: SVT Ablation;  Surgeon: [name], MD;  Location: [facility] INVASIVE CV LAB;  Service:  Cardiovascular;  Laterality: N/A;   NO PAST SURGERIES       OB History     Gravida  1   Para      Term      Preterm      AB  1   Living  0      SAB  1   IAB      Ectopic      Multiple      Live Births              Family History  Problem Relation Age of Onset   Diabetes Mother    Heart failure Mother    Diabetes Sister     Social History   Tobacco Use   Smoking status: Never   Smokeless tobacco: Never  Vaping Use   Vaping Use: Never used  Substance Use Topics   Alcohol use: No   Drug use: No    Home Medications Prior to Admission medications   Medication Sig Start Date End Date Taking? Authorizing Provider  amLODipine (NORVASC) 10 MG tablet Take 1 tablet (10 mg total) by mouth daily. 04/30/21 07/29/21 Yes Eber Hong, MD  aspirin EC 81 MG tablet Take 81 mg by mouth daily.     [provider]  Cyanocobalamin (VITAMIN B 12) 250 MCG LOZG Take by mouth.    [provider]  dexamethasone (DECADRON) 4 MG tablet Take 1 tablet (4 mg total) by  mouth 2 (two) times daily with a meal. 07/23/18   Ivery Quale, PA-C  ferrous sulfate 325 (65 FE) MG tablet Take 325-650 mg by mouth 2 (two) times daily. Take 2 tablets by mouth in the AM and 1 tablet in the PM    [provider]  fluticasone (FLONASE) 50 MCG/ACT nasal spray Place 2 sprays into both nostrils daily. 11/12/17   Tommie Sams, DO  folic acid (FOLVITE) 400 MCG tablet Take 400 mcg by mouth daily.    [provider]  hydrochlorothiazide (HYDRODIURIL) 25 MG tablet Take 25 mg by mouth daily as needed (fluid).    [provider]  HYDROcodone-homatropine (HYCODAN) 5-1.5 MG/5ML syrup Take 5 mLs by mouth every 6 (six) hours as needed. 07/23/18   Ivery Quale, PA-C  losartan (COZAAR) 100 MG tablet TAKE 1 TABLET BY MOUTH ONCE DAILY. **PLEASE SCHEDULE APPT FOR FURTHER REFILLS** 02/23/17   Allred, Fayrene Fearing, MD  metFORMIN (GLUCOPHAGE) 1000 MG tablet Take 1,000 mg by mouth 2  (two) times daily with a meal.    [provider]  metoprolol succinate (TOPROL-XL) 50 MG 24 hr tablet TAKE 1 TABLET BY MOUTH ONCE DAILY 02/23/17   Allred, Fayrene Fearing, MD  Multiple Vitamin (MULTIVITAMIN WITH MINERALS) TABS tablet Take 1 tablet by mouth daily.    [provider]  naproxen (NAPROSYN) 500 MG tablet Take 1 tablet (500 mg total) by mouth 2 (two) times daily. 01/10/20   Burgess Amor, PA-C  NOVOLIN 70/30 RELION (70-30) 100 UNIT/ML injection  11/09/17   [provider]    Allergies    Lisinopril  Review of Systems   Review of Systems  All other systems reviewed and are negative.  Physical Exam Updated Vital Signs BP (!) 203/95 (BP Location: Right Arm)   Pulse 80   Temp 98.6 F (37 C) (Oral)   Resp 18   Ht 1.803 m (5\' 11" )   Wt 133 kg   SpO2 100%   BMI 40.89 kg/m   Physical Exam Vitals and nursing note reviewed.  Constitutional:      General: She is not in acute distress.    Appearance: She is well-developed.  HENT:     Head: Normocephalic and atraumatic.     Mouth/Throat:     Pharynx: No oropharyngeal exudate.  Eyes:     General: No scleral icterus.       Right eye: No discharge.        Left eye: No discharge.     Conjunctiva/sclera: Conjunctivae normal.     Pupils: Pupils are equal, round, and reactive to light.  Neck:     Thyroid: No thyromegaly.     Vascular: No JVD.  Cardiovascular:     Rate and Rhythm: Normal rate and regular rhythm.     Heart sounds: Normal heart sounds. No murmur heard.   No friction rub. No gallop.  Pulmonary:     Effort: Pulmonary effort is normal. No respiratory distress.     Breath sounds: Normal breath sounds. No wheezing or rales.  Abdominal:     General: Bowel sounds are normal. There is no distension.     Palpations: Abdomen is soft. There is no mass.     Tenderness: There is no abdominal tenderness.  Musculoskeletal:        General: No tenderness. Normal range of motion.     Cervical back: Normal  range of motion and neck supple.  Lymphadenopathy:     Cervical: No cervical adenopathy.  Skin:  General: Skin is warm and dry.     Findings: No erythema or rash.  Neurological:     Mental Status: She is alert.     Coordination: Coordination normal.     Comments: Speech is clear, cranial nerves III through XII are intact, memory is intact, strength is normal in all 4 extremities including grips, sensation is intact to light touch and pinprick in all 4 extremities. Coordination as tested by finger-nose-finger is normal, no limb ataxia. Normal gait, normal reflexes at the patellar tendons bilaterally  Psychiatric:        Behavior: Behavior normal.    ED Results / Procedures / Treatments   Labs (all labs ordered are listed, but only abnormal results are displayed) Labs Reviewed  BASIC METABOLIC PANEL - Abnormal; Notable for the following components:      Result Value   Glucose, Bld 221 (*)    All other components within normal limits  CBC  TROPONIN I (HIGH SENSITIVITY)  TROPONIN I (HIGH SENSITIVITY)    EKG None  Radiology DG Chest 2 View  Result Date: 04/30/2021 CLINICAL DATA:  Dizziness.  Hypertension. EXAM: CHEST - 2 VIEW COMPARISON:  02/13/2020 FINDINGS: The heart size and mediastinal contours are within normal limits. Both lungs are clear. The visualized skeletal structures are unremarkable. IMPRESSION: No active cardiopulmonary disease. Electronically Signed   By: Paulina Fusi M.D.   On: 04/30/2021 15:21    Procedures Procedures   Medications Ordered in ED Medications  amLODipine (NORVASC) tablet 5 mg (has no administration in time range)    ED Course  I have reviewed the triage vital signs and the nursing notes.  Pertinent labs & imaging results that were available during my care of the patient were reviewed by me and considered in my medical decision making (see chart for details).    MDM Rules/Calculators/A&P                           The patient has an  unremarkable chest x-ray, labs are totally unremarkable except for glucose of 220 but there is no signs of organ dysfunction.  Her blood pressure rechecked on my exam is 199/90.  She will be given a dose of amlodipine and started on this at home.  I had a discussion with her about the need to start new medications given her ongoing high blood pressure, she states that she usually lives in the 150-160 systolic range.  I also asked her to cut back on her caffeine intake.  She does not drink alcohol nor does she take anti-inflammatories very often.  She is agreeable to the plan and stable for discharge at this time  Final Clinical Impression(s) / ED Diagnoses Final diagnoses:  Primary hypertension    Rx / DC Orders ED Discharge Orders          Ordered    amLODipine (NORVASC) 10 MG tablet  Daily        04/30/21 1647             Eber Hong, MD 04/30/21 1653

## 2021-04-30 NOTE — Discharge Instructions (Addendum)
Please measure your blood pressure every day at the same time approximately 2 hours after taking your medication.  Please report these numbers to your family doctor within 1 week but I want you to come back to the emergency department for severe or worsening symptoms

## 2021-09-22 ENCOUNTER — Emergency Department (HOSPITAL_COMMUNITY): Payer: Self-pay

## 2021-09-22 ENCOUNTER — Emergency Department (HOSPITAL_COMMUNITY)
Admission: EM | Admit: 2021-09-22 | Discharge: 2021-09-22 | Disposition: A | Discharge status: Home / Self Care | Payer: Self-pay | Attending: Emergency Medicine | Admitting: Emergency Medicine

## 2021-09-22 ENCOUNTER — Other Ambulatory Visit: Payer: Self-pay

## 2021-09-22 ENCOUNTER — Encounter (HOSPITAL_COMMUNITY): Payer: Self-pay | Admitting: Emergency Medicine

## 2021-09-22 DIAGNOSIS — Z79899 Other long term (current) drug therapy: Secondary | ICD-10-CM | POA: Insufficient documentation

## 2021-09-22 DIAGNOSIS — H532 Diplopia: Secondary | ICD-10-CM | POA: Insufficient documentation

## 2021-09-22 DIAGNOSIS — R519 Headache, unspecified: Secondary | ICD-10-CM | POA: Insufficient documentation

## 2021-09-22 DIAGNOSIS — R531 Weakness: Secondary | ICD-10-CM | POA: Insufficient documentation

## 2021-09-22 DIAGNOSIS — Z20822 Contact with and (suspected) exposure to covid-19: Secondary | ICD-10-CM | POA: Insufficient documentation

## 2021-09-22 DIAGNOSIS — Z7984 Long term (current) use of oral hypoglycemic drugs: Secondary | ICD-10-CM | POA: Insufficient documentation

## 2021-09-22 DIAGNOSIS — I10 Essential (primary) hypertension: Secondary | ICD-10-CM | POA: Insufficient documentation

## 2021-09-22 DIAGNOSIS — E119 Type 2 diabetes mellitus without complications: Secondary | ICD-10-CM | POA: Insufficient documentation

## 2021-09-22 DIAGNOSIS — Z7982 Long term (current) use of aspirin: Secondary | ICD-10-CM | POA: Insufficient documentation

## 2021-09-22 DIAGNOSIS — R252 Cramp and spasm: Secondary | ICD-10-CM | POA: Insufficient documentation

## 2021-09-22 LAB — BASIC METABOLIC PANEL
Anion gap: 9 (ref 5–15)
BUN: 8 mg/dL (ref 6–20)
CO2: 26 mmol/L (ref 22–32)
Calcium: 9 mg/dL (ref 8.9–10.3)
Chloride: 101 mmol/L (ref 98–111)
Creatinine, Ser: 0.75 mg/dL (ref 0.44–1.00)
GFR, Estimated: >60 mL/min (ref >60)
Glucose, Bld: 172 mg/dL — ABNORMAL HIGH (ref 70–99)
Potassium: 3.9 mmol/L (ref 3.5–5.1)
Sodium: 136 mmol/L (ref 135–145)

## 2021-09-22 LAB — RESP PANEL BY RT-PCR (FLU A&B, COVID) ARPGX2
Influenza A by PCR: NEGATIVE
Influenza B by PCR: NEGATIVE
SARS Coronavirus 2 by RT PCR: NEGATIVE

## 2021-09-22 LAB — CBC
HCT: 37.9 % (ref 36.0–46.0)
Hemoglobin: 12 g/dL (ref 12.0–15.0)
MCH: 29.3 pg (ref 26.0–34.0)
MCHC: 31.7 g/dL (ref 30.0–36.0)
MCV: 92.7 fL (ref 80.0–100.0)
Platelets: 425 10*3/uL — ABNORMAL HIGH (ref 150–400)
RBC: 4.09 MIL/uL (ref 3.87–5.11)
RDW: 13.7 % (ref 11.5–15.5)
WBC: 7.9 10*3/uL (ref 4.0–10.5)
nRBC: 0 % (ref 0.0–0.2)

## 2021-09-22 LAB — SEDIMENTATION RATE: Sed Rate: 74 mm/hr — ABNORMAL HIGH (ref 0–22)

## 2021-09-22 IMAGING — MR MR CERVICAL SPINE WO/W CM
4 of 6 series · 18 of 48 positions shown · IV contrast (gadavist)
Comparison: Radiograph from [DATE].

CLINICAL DATA: Initial evaluation for ataxia, nontraumatic.

EXAM:
MRI CERVICAL SPINE WITHOUT AND WITH CONTRAST
TECHNIQUE: Multiplanar and multiecho pulse sequences of the cervical spine, to
include the craniocervical junction and cervicothoracic junction,
were obtained without and with intravenous contrast.
CONTRAST:  8mL GADAVIST GADOBUTROL 1 MMOL/ML IV SOLN

[Series 10: T2 · sagittal · 3.0mm · 0.43mm/px · 3 of 15 slices shown (1 of 2)]
[im 1/15]
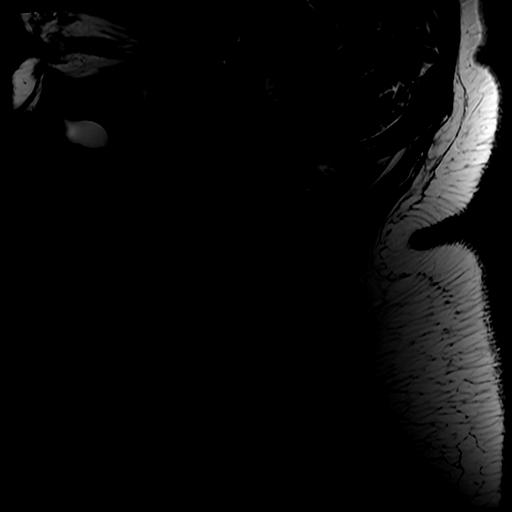
[im 8/15]
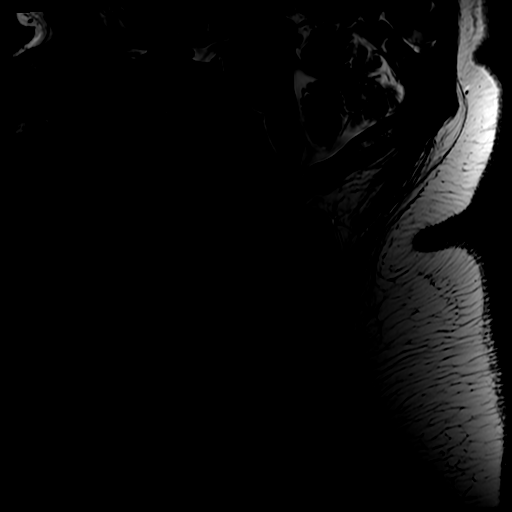
[im 15/15]
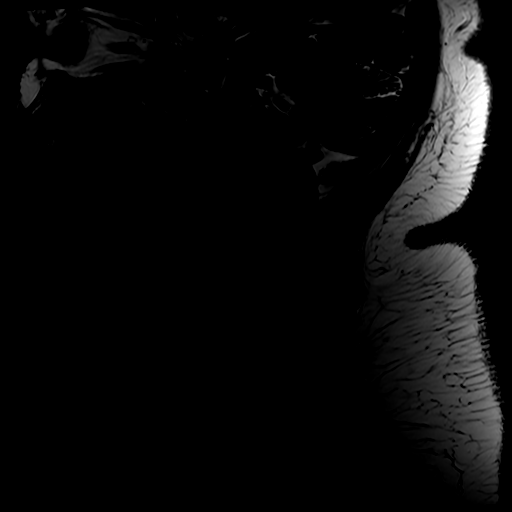

[Series 12: STIR · sagittal · 3.0mm · 0.43mm/px · 3 of 15 slices shown]
[im 1/15]
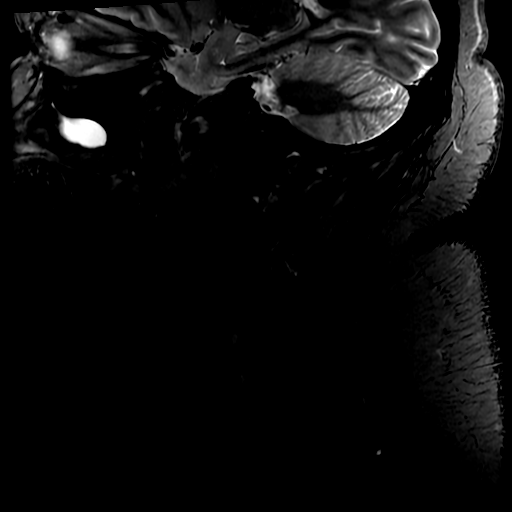
[im 8/15]
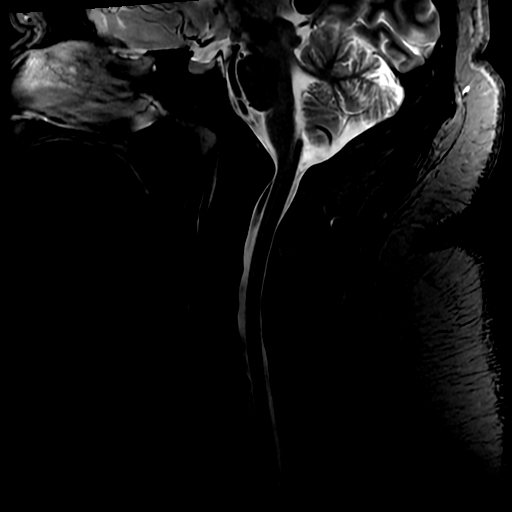
[im 15/15]
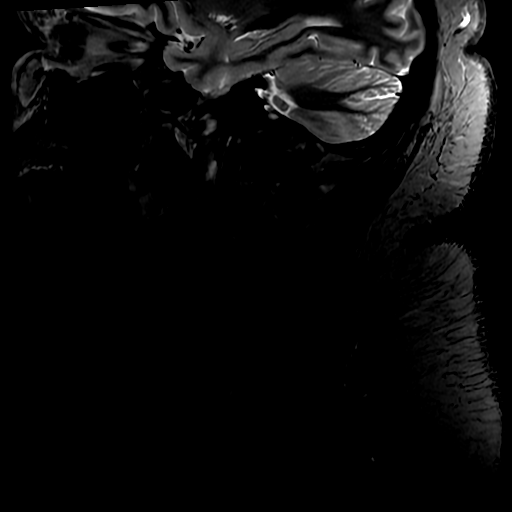

[Series 14: T2 · axial · 3.0mm · 0.35mm/px · z∈[-236,-121]mm · 8 of 36 slices shown (2 of 2)]
[im 1/36]
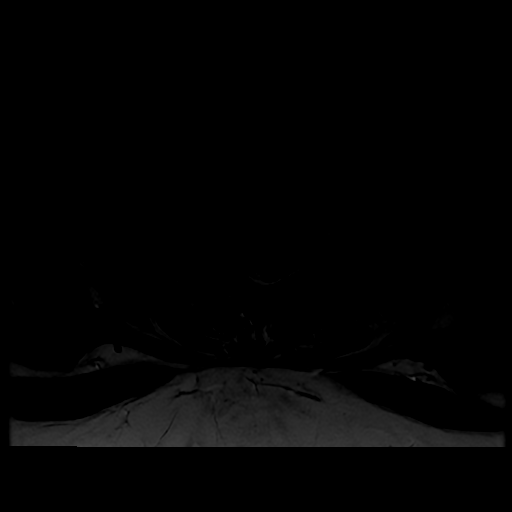
[im 6/36]
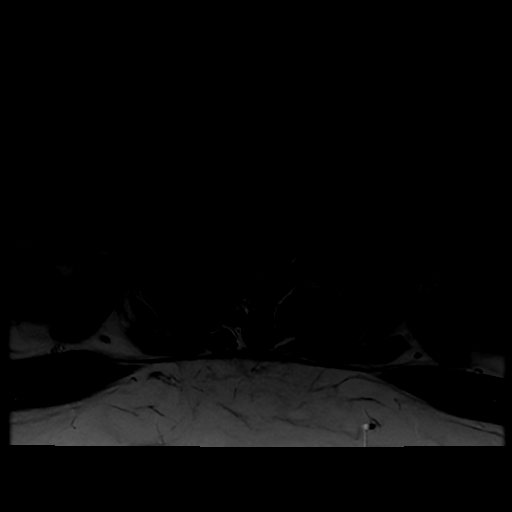
[im 11/36]
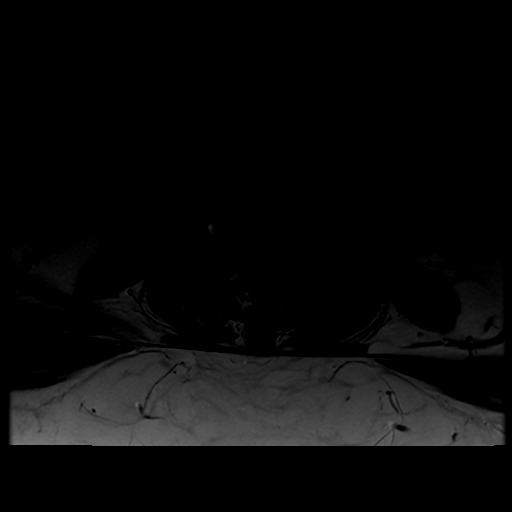
[im 16/36]
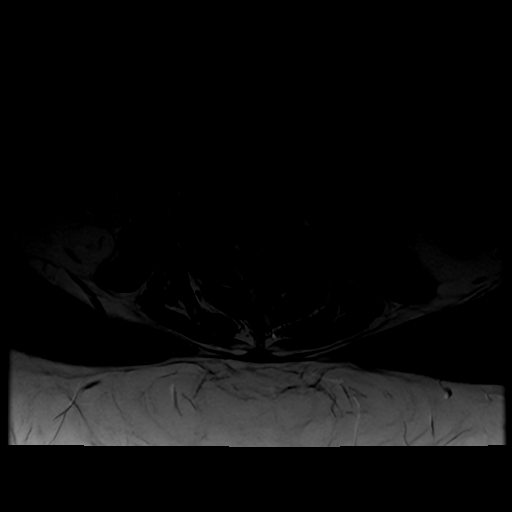
[im 21/36]
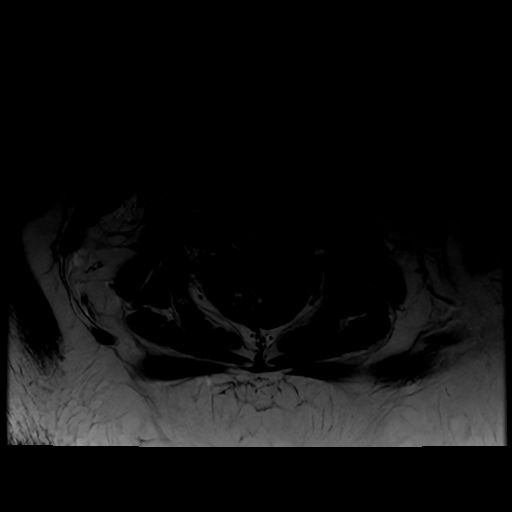
[im 26/36]
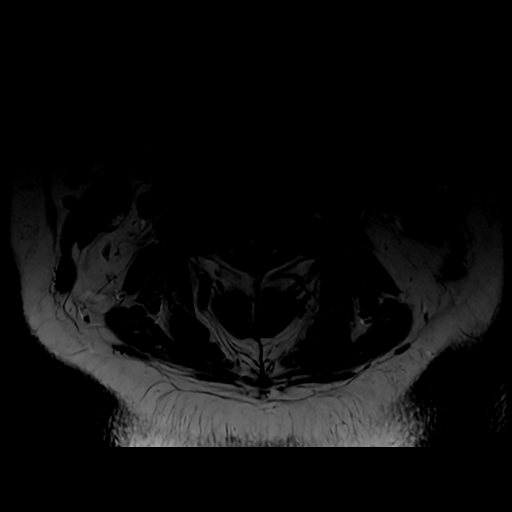
[im 31/36]
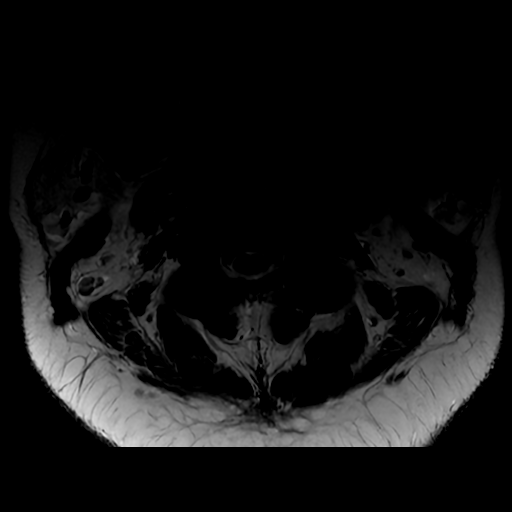
[im 36/36]
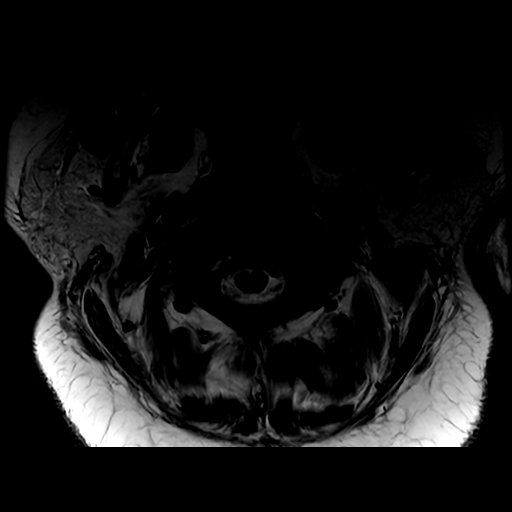

[Series 15: T1 · axial · non-contrast · 3.0mm · 0.35mm/px · z∈[-236,-137]mm · 4 of 36 slices shown]
[im 1/36]
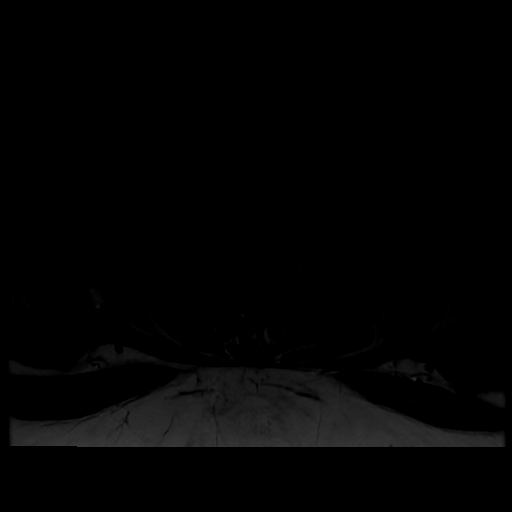
[im 6/36]
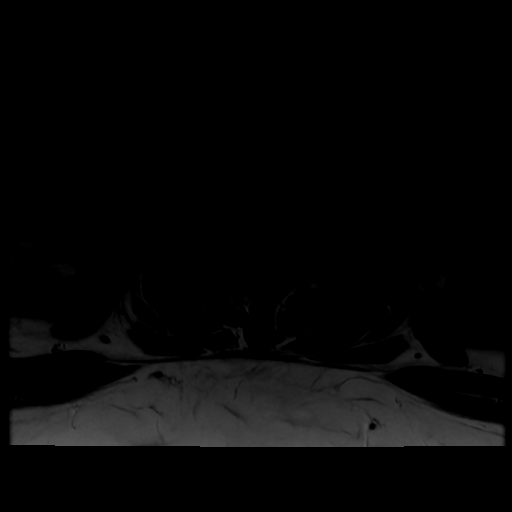
[im 21/36]
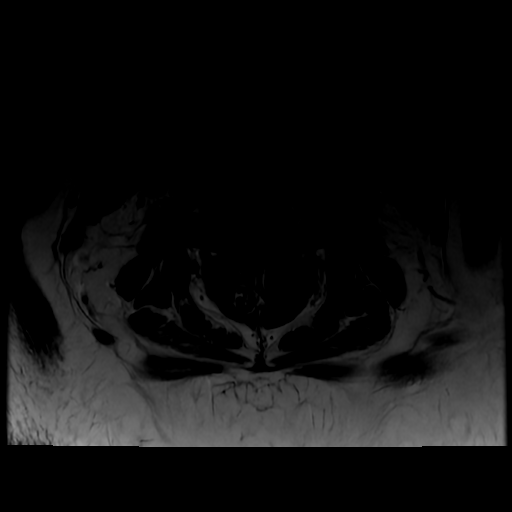
[im 31/36]
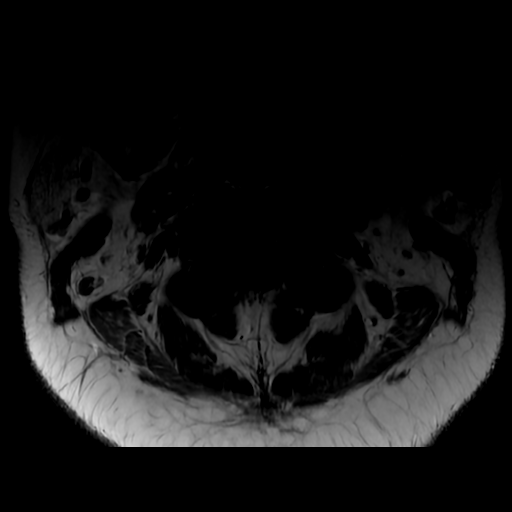

[18 of 48 positions shown; findings below may reference images not displayed]

FINDINGS: Alignment: Straightening of the normal cervical lordosis. No
listhesis.

Vertebrae: Vertebral body height maintained without acute or chronic
fracture. Bone marrow signal intensity within normal limits. No
discrete or worrisome osseous lesions or abnormal marrow edema.

Cord: Normal signal and morphology.

Posterior Fossa, vertebral arteries, paraspinal tissues:
Craniocervical junction normal. Paraspinous soft tissues within
normal limits. Normal flow voids seen within the vertebral arteries
bilaterally.

Disc levels:

C2-C3: Unremarkable.

C3-C4: Small central disc protrusion minimally indents the ventral
thecal sac (series 13, image 34). No significant spinal stenosis.
Mild uncovertebral spurring without significant foraminal
encroachment.

C4-C5: Minimal disc bulge with uncovertebral hypertrophy. No spinal
stenosis. Foramina remain patent.

C5-C6: Small central disc protrusion indents the ventral thecal sac
(series 13, image 63). No significant spinal stenosis. Mild
uncovertebral spurring without significant foraminal encroachment.

C6-C7: Right paracentral disc protrusion indents the ventral thecal
sac (series 13, image 77). No significant spinal stenosis or cord
deformity. Foramina remain patent.

C7-T1: Negative interspace. Mild right-sided facet hypertrophy. No
canal or foraminal stenosis.
IMPRESSION: 1. Mild degenerative spondylosis at C3-4 through C6-7 without
significant spinal stenosis or neural impingement. No significant
foraminal encroachment within the cervical spine.
2. Normal MRI of the cervical spinal cord.
3. Please note that the peripheral IV infiltrated upon
administration of IV contrast. As a result, no postcontrast
sequences are available for review.

## 2021-09-22 IMAGING — MR MR THORACIC SPINE WO/W CM
5 of 7 series · 18 of 48 positions shown · IV contrast (gadavist)
Comparison: None available.

CLINICAL DATA: Initial evaluation for acute ataxia, nontraumatic.

EXAM:
MRI THORACIC WITHOUT AND WITH CONTRAST
TECHNIQUE: Multiplanar and multiecho pulse sequences of the thoracic spine were
obtained without and with intravenous contrast.
CONTRAST:  8mL GADAVIST GADOBUTROL 1 MMOL/ML IV SOLN

[Series 17: T1 · sagittal · 3.0mm · 0.90mm/px · 1 of 12 slices shown (1 of 3)]
[im 1/12]
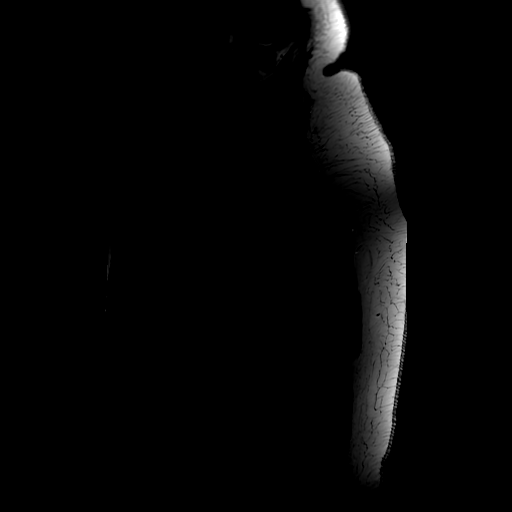

[Series 19: T2 · sagittal · 3.0mm · 0.66mm/px · 3 of 17 slices shown (1 of 2)]
[im 1/17]
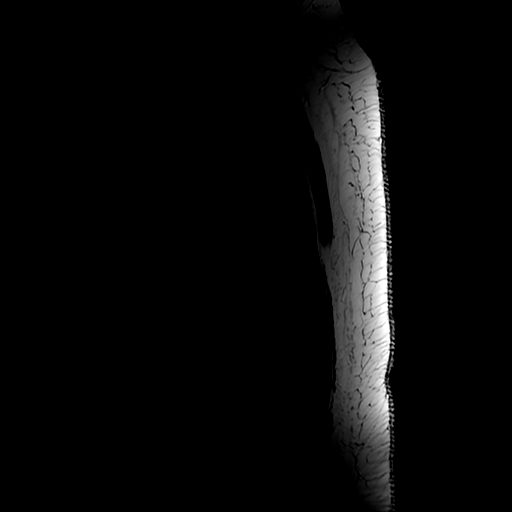
[im 9/17]
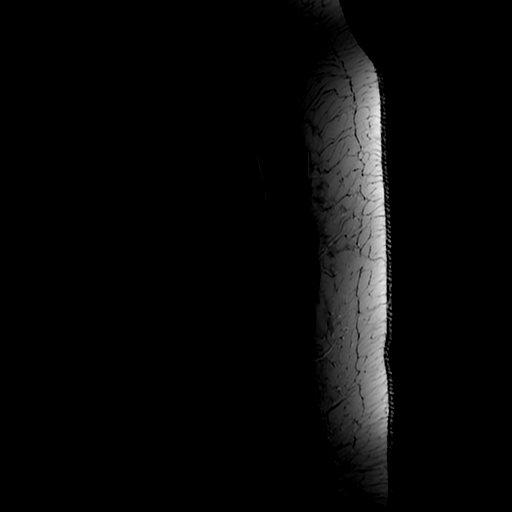
[im 17/17]
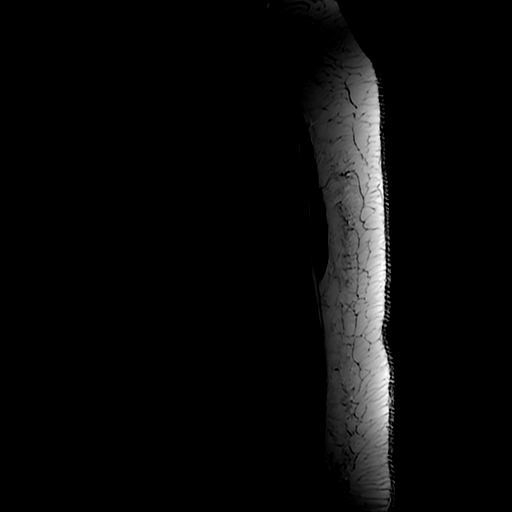

[Series 21: T1 · sagittal · 3.0mm · 0.66mm/px · 4 of 17 slices shown (2 of 3)]
[im 1/17]
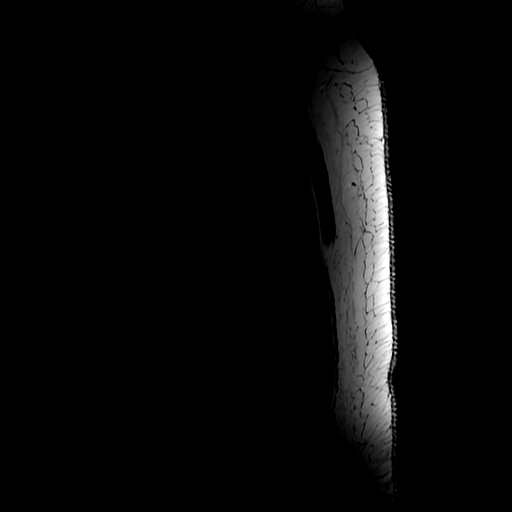
[im 6/17]
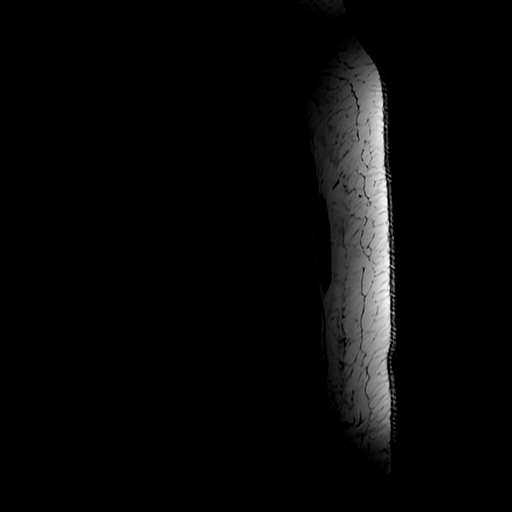
[im 11/17]
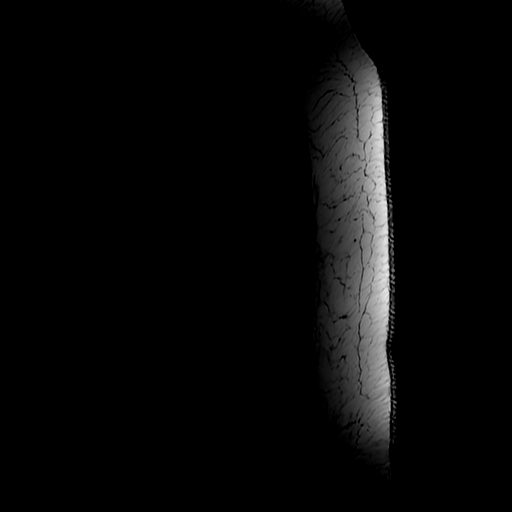
[im 17/17]
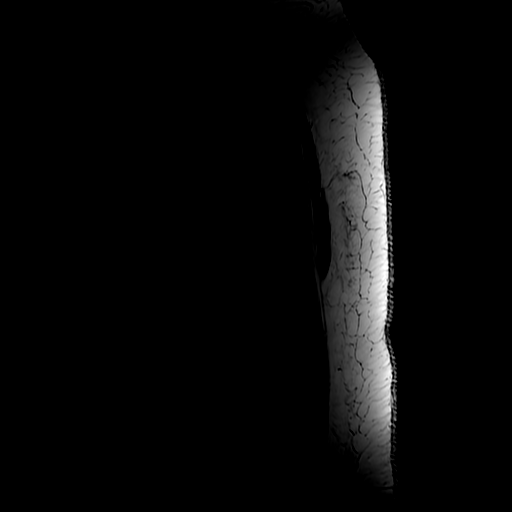

[Series 22: T2 · axial · 4.0mm · 0.39mm/px · z∈[-473,-231]mm · 8 of 47 slices shown (2 of 2)]
[im 1/47]
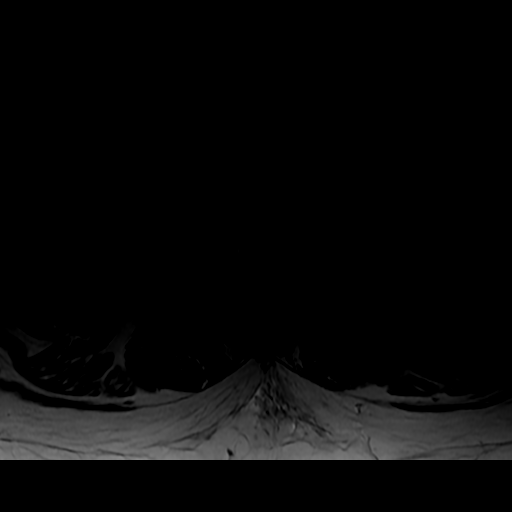
[im 6/47]
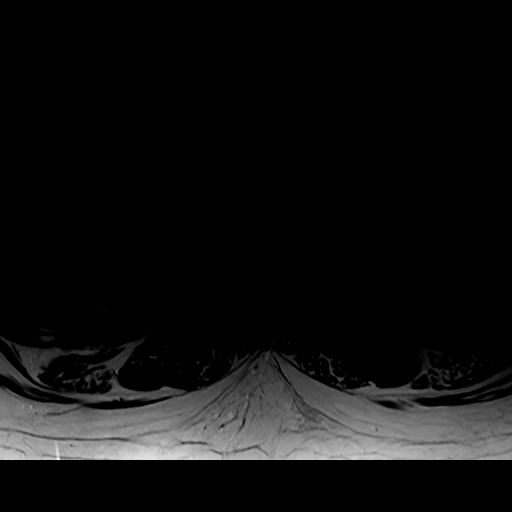
[im 16/47]
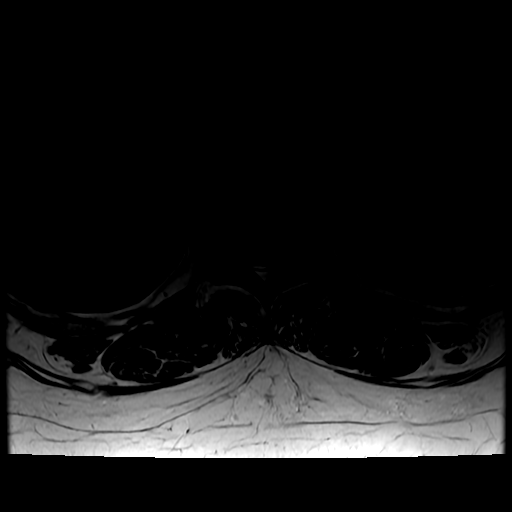
[im 21/47]
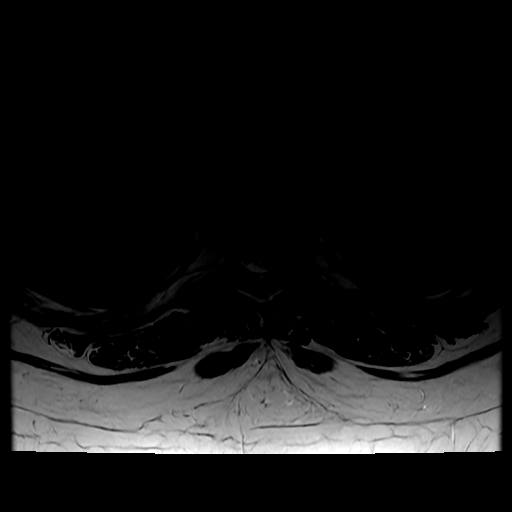
[im 26/47]
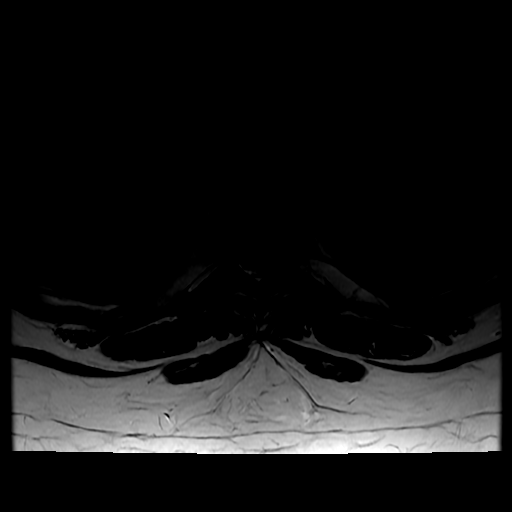
[im 31/47]
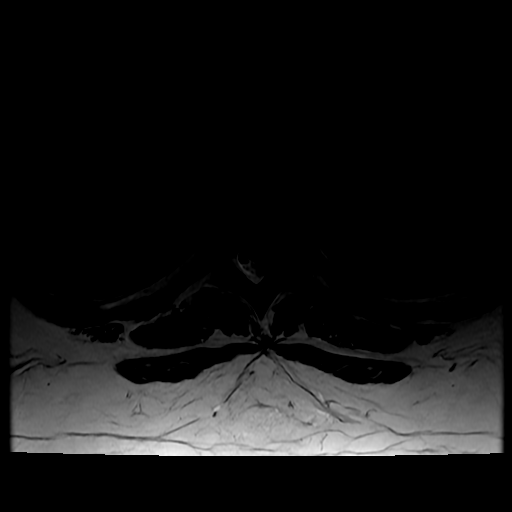
[im 41/47]
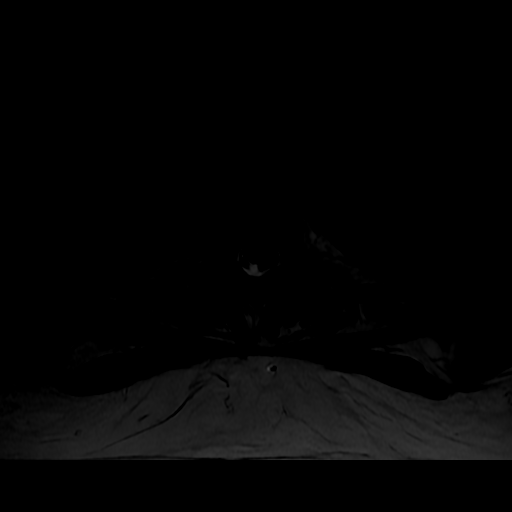
[im 47/47]
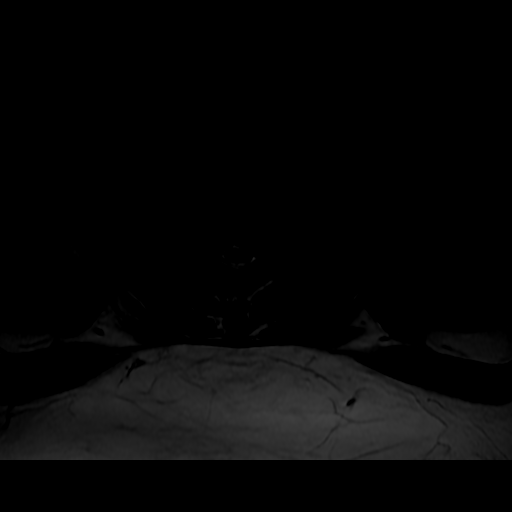

[Series 24: T1 · axial · non-contrast · 3.0mm · 0.39mm/px · z∈[-451,-416]mm · 2 of 76 slices shown (3 of 3)]
[im 6/76]
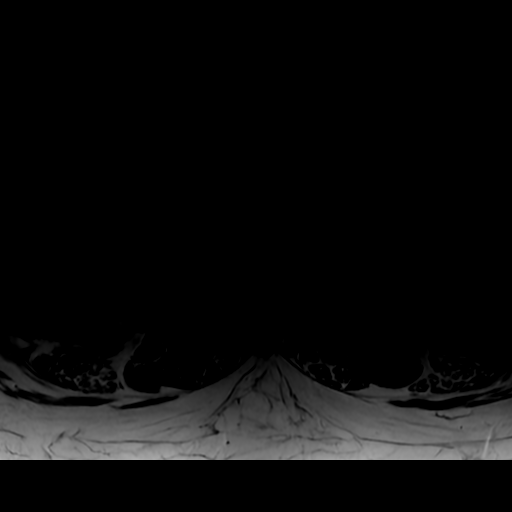
[im 16/76]
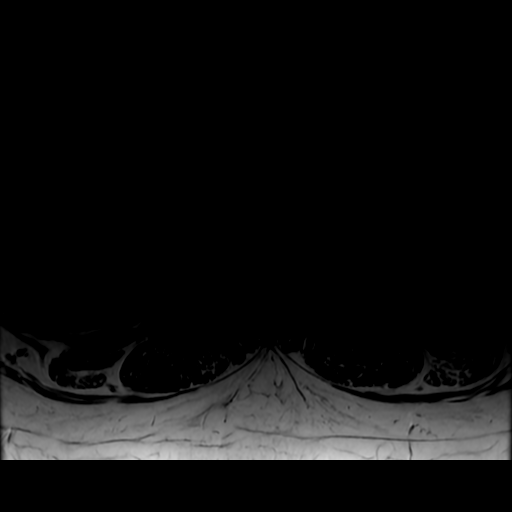

[18 of 48 positions shown; findings below may reference images not displayed]

FINDINGS: Alignment: Vertebral bodies normally aligned with preservation of
the normal thoracic kyphosis. No listhesis.

Vertebrae: Vertebral body height well maintained without acute or
chronic fracture. Bone marrow signal intensity within normal limits.
Few scattered benign hemangiomata noted. No worrisome osseous
lesions. No abnormal marrow edema.

Cord:  Normal signal and morphology.

Paraspinal and other soft tissues: Unremarkable.

Disc levels:

T4-5: Normal interspace. Right worse than left facet hypertrophy. No
spinal stenosis. Mild right foraminal narrowing. Left neural
foramina remains patent.

T5-6: Negative interspace. Left-sided facet hypertrophy. No
significant canal or foraminal stenosis.

T6-7: Small right paracentral disc protrusion indents the ventral
thecal sac (series 23, image 20). Minimal cord flattening without
cord signal changes or significant spinal stenosis.

T7-8: Small central disc protrusion indents the ventral thecal sac
(series 23, image 25). No significant spinal stenosis or cord
deformity. Foramina remain patent.

T8-9: Small central disc protrusion minimally indents the ventral
thecal sac (series 23, image 29). No significant canal or foraminal
stenosis.

Otherwise, no other significant disc pathology seen within the
thoracic spine for patient age. No other canal or foraminal stenosis
or evidence for neural impingement.
IMPRESSION: 1. Normal MRI appearance of the thoracic spinal cord. No findings to
explain patient's symptoms.
2. Small disc protrusions at T6-7 through T8-9 without significant
spinal stenosis.
3. Right greater than left facet hypertrophy at T4-5 with resultant
mild right foraminal stenosis.
4. Please note that the peripheral IV infiltrated upon
administration of IV contrast. As a result, no postcontrast imaging
is available for review.

## 2021-09-22 IMAGING — MR MR HEAD WO/W CM
6 of 11 series · 26 of 48 positions shown · IV contrast (gadavist)
Comparison: None available.

CLINICAL DATA: Initial evaluation for acute headache, diplopia.

EXAM:
MRI HEAD WITHOUT AND WITH CONTRAST
TECHNIQUE: Multiplanar, multiecho pulse sequences of the brain and surrounding
structures were obtained without and with intravenous contrast.
CONTRAST:  8mL GADAVIST GADOBUTROL 1 MMOL/ML IV SOLN

[Series 2: DWI · axial · 3.0mm · 0.94mm/px · z∈[-118,+30]mm · 8 of 100 slices shown (1 of 2)]
[im 1/100]
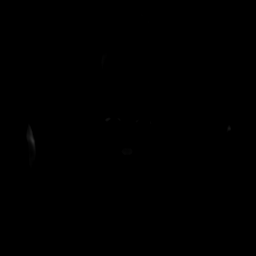
[im 15/100]
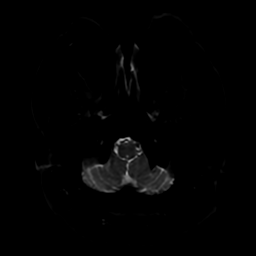
[im 29/100]
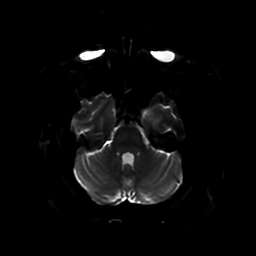
[im 43/100]
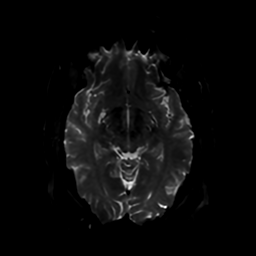
[im 57/100]
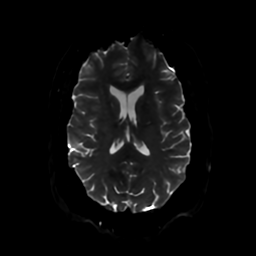
[im 71/100]
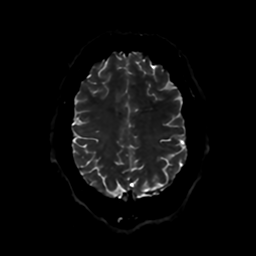
[im 85/100]
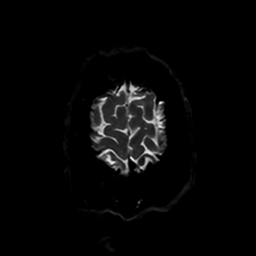
[im 100/100]
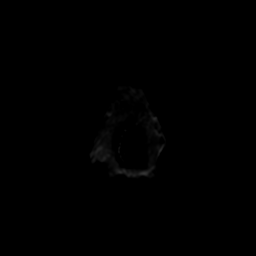

[Series 3: DWI · coronal · 4.0mm · 0.94mm/px · 6 of 74 slices shown (2 of 2)]
[im 1/74]
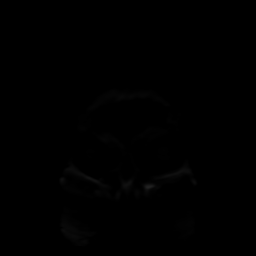
[im 15/74]
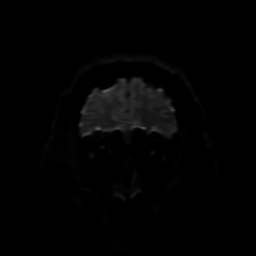
[im 30/74]
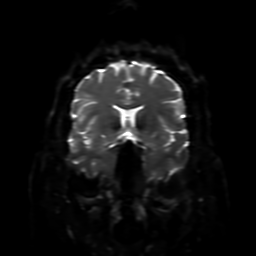
[im 44/74]
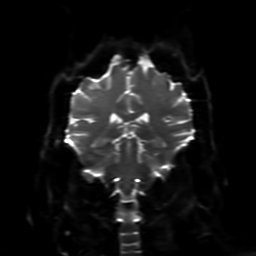
[im 59/74]
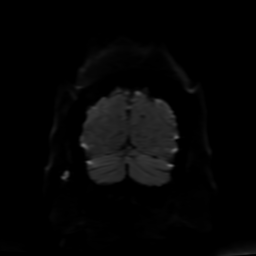
[im 74/74]
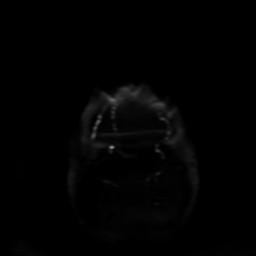

[Series 4: FLAIR · sagittal · 5.0mm · 0.23mm/px · 2 of 23 slices shown (1 of 2)]
[im 1/23]
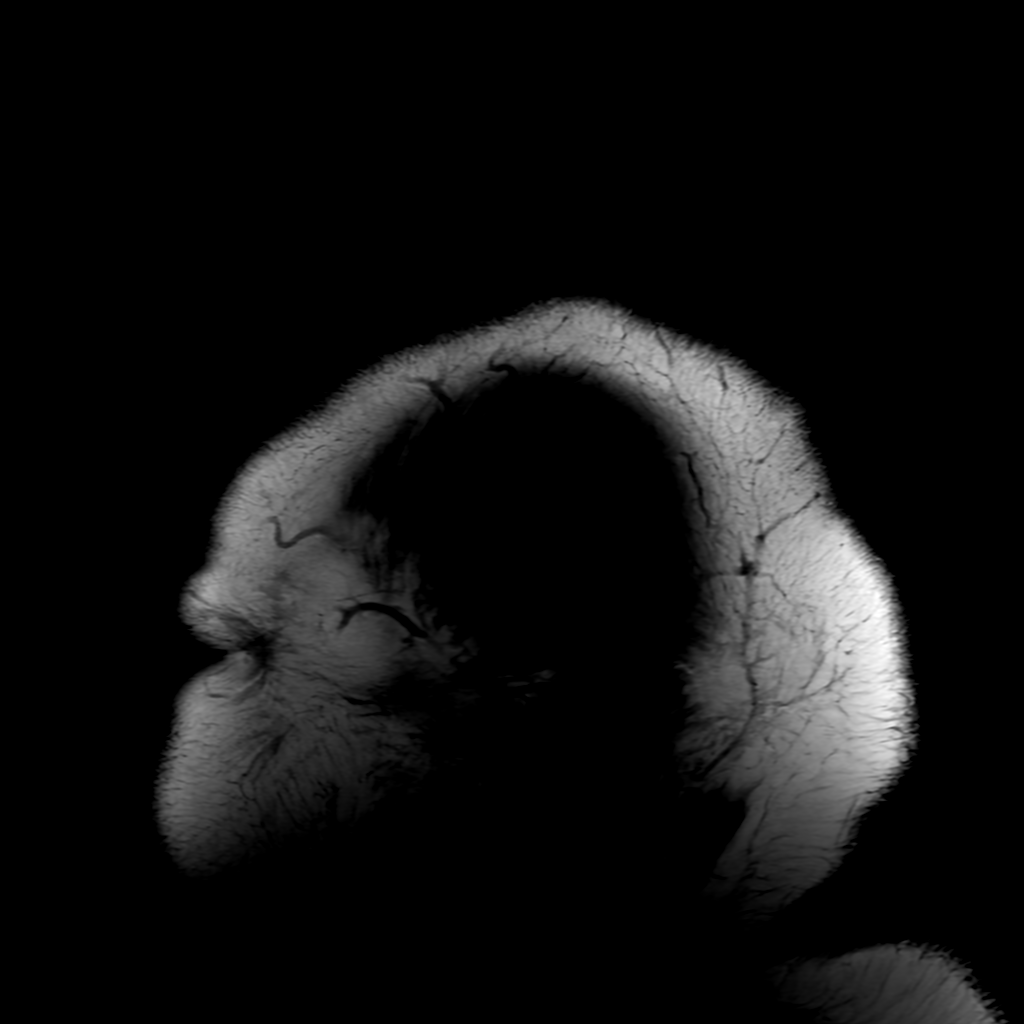
[im 23/23]
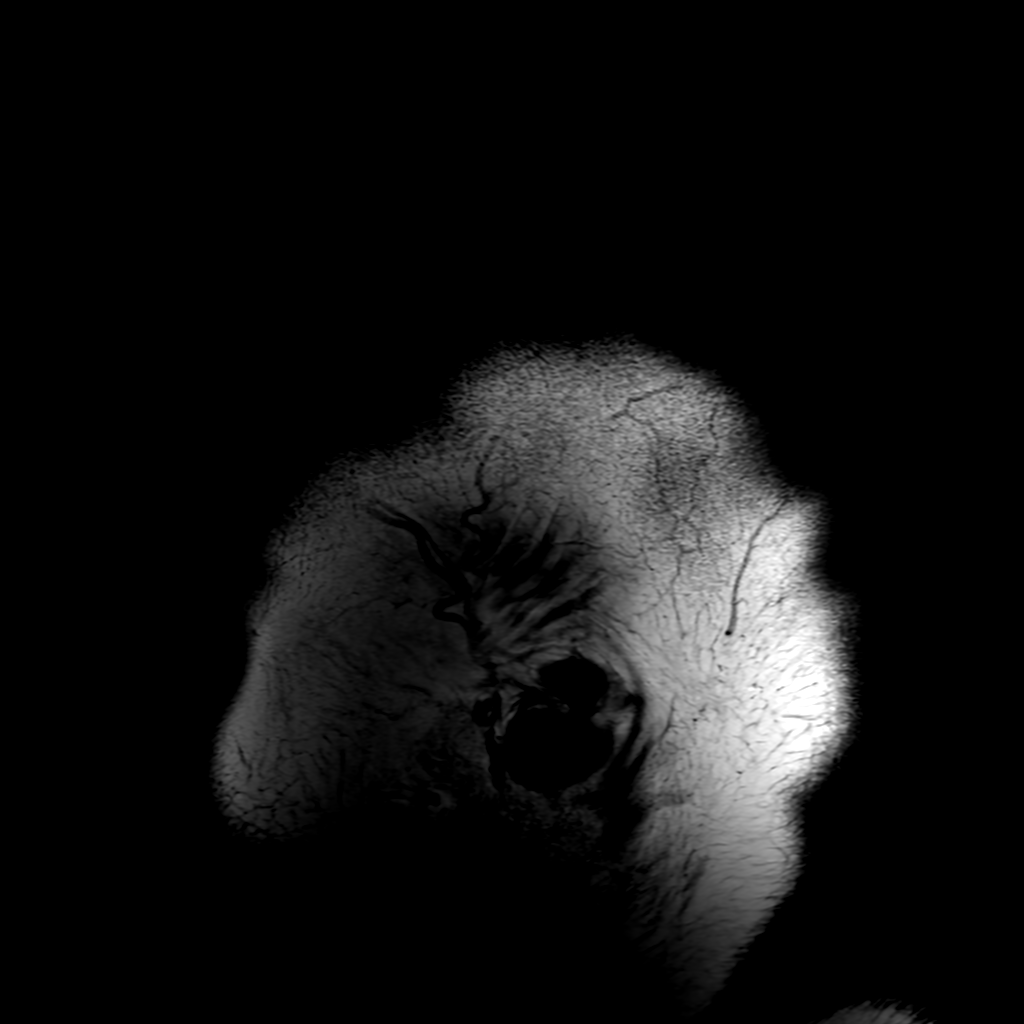

[Series 6: FLAIR · axial · 4.0mm · 0.45mm/px · z∈[-116,+29]mm · 3 of 35 slices shown (2 of 2)]
[im 1/35]
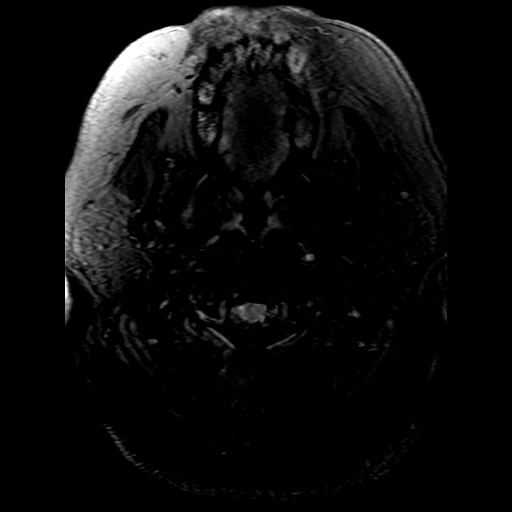
[im 18/35]
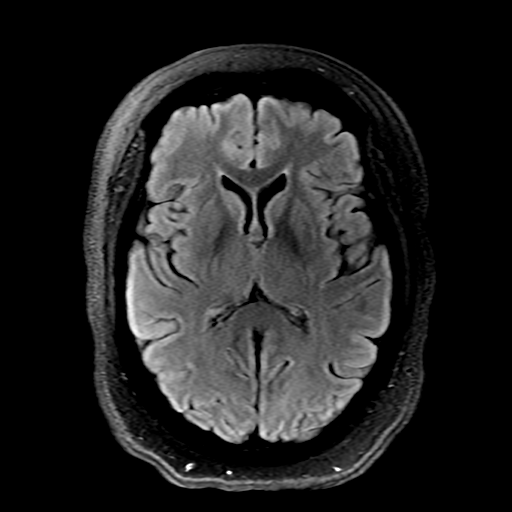
[im 35/35]
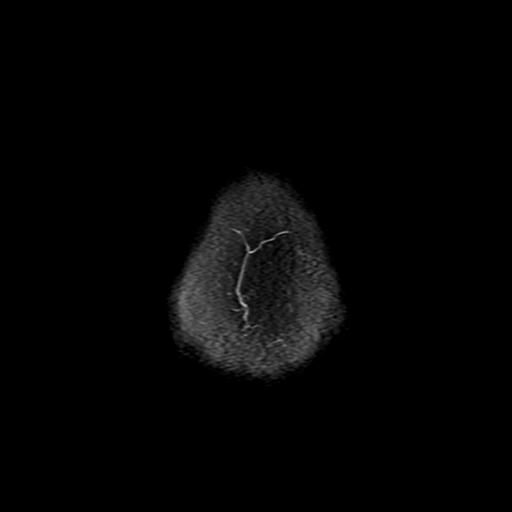

[Series 250: ADC · axial · 3.0mm · 0.94mm/px · z∈[-118,+30]mm · 4 of 52 slices shown (1 of 2)]
[im 1/52]
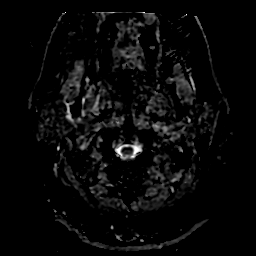
[im 18/52]
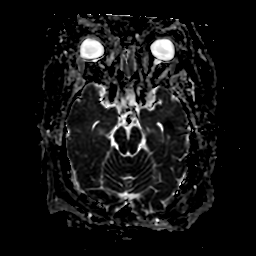
[im 35/52]
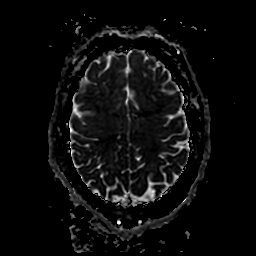
[im 52/52]
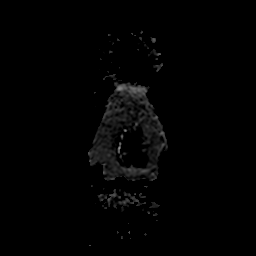

[Series 350: ADC · coronal · 4.0mm · 0.94mm/px · 3 of 37 slices shown (2 of 2)]
[im 1/37]
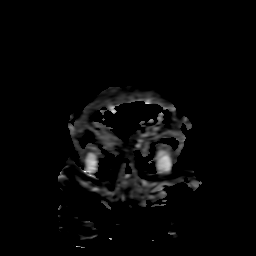
[im 19/37]
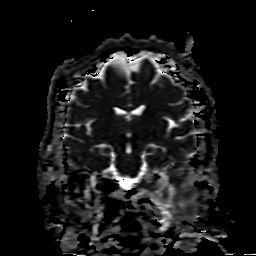
[im 37/37]
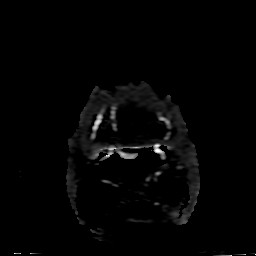

[26 of 48 positions shown; findings below may reference images not displayed]

FINDINGS: Brain: Examination is somewhat technically limited as the peripheral
IV infiltrated upon administration of contrast. As a result, no
postcontrast sequences are available for review.

Cerebral volume within normal limits. No focal parenchymal signal
abnormality. No evidence for acute or subacute ischemia. Gray-white
matter differentiation maintained. No encephalomalacia to suggest
chronic cortical infarction or other insult. No acute or chronic
intracranial blood products.

No mass lesion, midline shift or mass effect. No hydrocephalus or
extra-axial fluid collection. Pituitary gland suprasellar region
within normal limits. Midline structures intact.

Vascular: Major intracranial vascular flow voids are maintained.

Skull and upper cervical spine: Craniocervical junction within
normal limits. Bone marrow signal intensity within normal limits. No
scalp soft tissue abnormality.

Sinuses/Orbits: Globes and orbital soft tissues demonstrate no acute
finding. Left maxillary sinus retention cyst. Paranasal sinuses are
otherwise clear. No mastoid effusion. Inner ear structures grossly
normal.

Other: None.
IMPRESSION: 1. Normal brain MRI. No acute intracranial abnormality identified.
2. Please note that the peripheral IV infiltrated upon
administration of IV contrast. As a result, no postcontrast images
are available for review.

## 2021-09-22 MED ORDER — DIPHENHYDRAMINE HCL 50 MG/ML IJ SOLN
12.5000 mg | Freq: Once | INTRAMUSCULAR | Status: AC
  Administered 2021-09-22: 12.5 mg via INTRAVENOUS
  Filled 2021-09-22: qty 1

## 2021-09-22 MED ORDER — GADOBUTROL 1 MMOL/ML IV SOLN
8.0000 mL | Freq: Once | INTRAVENOUS | Status: AC | PRN
  Administered 2021-09-22: 8 mL via INTRAVENOUS

## 2021-09-22 MED ORDER — METOCLOPRAMIDE HCL 5 MG/ML IJ SOLN
5.0000 mg | Freq: Once | INTRAMUSCULAR | Status: AC
  Administered 2021-09-22: 5 mg via INTRAVENOUS
  Filled 2021-09-22: qty 2

## 2021-09-22 MED ORDER — LORAZEPAM 2 MG/ML IJ SOLN: 1.0000 mg | Freq: Once | INTRAMUSCULAR | Status: DC | PRN

## 2021-09-22 NOTE — ED Notes (Signed)
Patient transported to MRI 

## 2021-09-22 NOTE — ED Provider Notes (Cosign Needed Addendum)
Patient was seen at [facility] emergency department earlier today and sent to [facility] department for MRI.  See previous provider note for further details.  Briefly, patient is a 46 year old female with a past medical history of hypertension, diabetes, and obesity.  Presents to the emergency department with a chief complaint of persistent headaches.  Patient has had left-sided headache with left-sided scalp sensitivity over the past month.  Patient had some mild relief medications with Fioricet.  She been having intermittent diplopia to left eye.  Diplopia comes on randomly.  Patient reports that she has had weakness to her right leg and a cracking sensation in right anterior thigh that began this morning after waking.  Additionally patient reports that she had migraines as a teenager however has not had any migraine headaches since 1998.    Physical Exam  BP (!) 165/67    Pulse 78    Temp 99.3 F (37.4 C)    Resp 20    Ht 5\' 11"  (1.803 m)    Wt 132.5 kg    LMP 08/22/2021    SpO2 100%    BMI 40.73 kg/m   Physical Exam Vitals and nursing note reviewed.  Constitutional:      General: She is not in acute distress.    Appearance: She is not ill-appearing, toxic-appearing or diaphoretic.  Eyes:     General:        Right eye: No discharge.        Left eye: No discharge.     Extraocular Movements: Extraocular movements intact.     Conjunctiva/sclera: Conjunctivae normal.     Pupils: Pupils are equal, round, and reactive to light.  Cardiovascular:     Rate and Rhythm: Normal rate.  Pulmonary:     Effort: Pulmonary effort is normal.  Musculoskeletal:     Cervical back: Normal range of motion and neck supple. No rigidity.  Skin:    General: Skin is warm and dry.  Neurological:     General: No focal deficit present.     Mental Status: She is alert.     GCS: GCS eye subscore is 4. GCS verbal subscore is 5. GCS motor subscore is 6.     Cranial Nerves: No cranial nerve deficit,  dysarthria or facial asymmetry.     Motor: No weakness, tremor, seizure activity or pronator drift.     Coordination: Romberg sign negative. Finger-Nose-Finger Test normal.     Deep Tendon Reflexes:     Reflex Scores:      Patellar reflexes are 2+ on the right side and 2+ on the left side.    Comments: equal grip strength, +5 strength to bilateral upper extremities.  +5 strength to left lower extremity.  +4 strength to right lower extremity.  Sensation to light touch grossly intact to bilateral upper and lower extremities.  Reports of diplopia to left eye    Psychiatric:        Behavior: Behavior is cooperative.    Procedures  Procedures  ED Course / MDM    MR THORACIC SPINE W WO CONTRAST 09/22/2021  Narrative CLINICAL DATA:  Initial evaluation for acute ataxia, nontraumatic.  EXAM: MRI THORACIC WITHOUT AND WITH CONTRAST  TECHNIQUE: Multiplanar and multiecho pulse sequences of the thoracic spine were obtained without and with intravenous contrast.  CONTRAST:  8mL GADAVIST GADOBUTROL 1 MMOL/ML IV SOLN  COMPARISON:  None available.  FINDINGS: Alignment: Vertebral bodies normally aligned with preservation of the normal thoracic kyphosis. No  listhesis.  Vertebrae: Vertebral body height well maintained without acute or chronic fracture. Bone marrow signal intensity within normal limits. Few scattered benign hemangiomata noted. No worrisome osseous lesions. No abnormal marrow edema.  Cord:  Normal signal and morphology.  Paraspinal and other soft tissues: Unremarkable.  Disc levels:  T4-5: Normal interspace. Right worse than left facet hypertrophy. No spinal stenosis. Mild right foraminal narrowing. Left neural foramina remains patent.  T5-6: Negative interspace. Left-sided facet hypertrophy. No significant canal or foraminal stenosis.  T6-7: Small right paracentral disc protrusion indents the ventral thecal sac (series 23, image 20). Minimal cord flattening  without cord signal changes or significant spinal stenosis.  T7-8: Small central disc protrusion indents the ventral thecal sac (series 23, image 25). No significant spinal stenosis or cord deformity. Foramina remain patent.  T8-9: Small central disc protrusion minimally indents the ventral thecal sac (series 23, image 29). No significant canal or foraminal stenosis.  Otherwise, no other significant disc pathology seen within the thoracic spine for patient age. No other canal or foraminal stenosis or evidence for neural impingement.  IMPRESSION: 1. Normal MRI appearance of the thoracic spinal cord. No findings to explain patient's symptoms. 2. Small disc protrusions at T6-7 through T8-9 without significant spinal stenosis. 3. Right greater than left facet hypertrophy at T4-5 with resultant mild right foraminal stenosis. 4. Please note that the peripheral IV infiltrated upon administration of IV contrast. As a result, no postcontrast imaging is available for review.   Electronically Signed By: Rise Mu M.D. On: 09/22/2021 21:57  MR Brain W and Wo Contrast 09/22/2021  Narrative CLINICAL DATA:  Initial evaluation for acute headache, diplopia.  EXAM: MRI HEAD WITHOUT AND WITH CONTRAST  TECHNIQUE: Multiplanar, multiecho pulse sequences of the brain and surrounding structures were obtained without and with intravenous contrast.  CONTRAST:  57mL GADAVIST GADOBUTROL 1 MMOL/ML IV SOLN  COMPARISON:  None available.  FINDINGS: Brain: Examination is somewhat technically limited as the peripheral IV infiltrated upon administration of contrast. As a result, no postcontrast sequences are available for review.  Cerebral volume within normal limits. No focal parenchymal signal abnormality. No evidence for acute or subacute ischemia. Gray-white matter differentiation maintained. No encephalomalacia to suggest chronic cortical infarction or other insult. No acute or  chronic intracranial blood products.  No mass lesion, midline shift or mass effect. No hydrocephalus or extra-axial fluid collection. Pituitary gland suprasellar region within normal limits. Midline structures intact.  Vascular: Major intracranial vascular flow voids are maintained.  Skull and upper cervical spine: Craniocervical junction within normal limits. Bone marrow signal intensity within normal limits. No scalp soft tissue abnormality.  Sinuses/Orbits: Globes and orbital soft tissues demonstrate no acute finding. Left maxillary sinus retention cyst. Paranasal sinuses are otherwise clear. No mastoid effusion. Inner ear structures grossly normal.  Other: None.  IMPRESSION: 1. Normal brain MRI. No acute intracranial abnormality identified. 2. Please note that the peripheral IV infiltrated upon administration of IV contrast. As a result, no postcontrast images are available for review.   Electronically Signed By: Rise Mu M.D. On: 09/22/2021 21:38   MR Cervical Spine W or Wo Contrast 09/22/2021  Narrative CLINICAL DATA:  Initial evaluation for ataxia, nontraumatic.  EXAM: MRI CERVICAL SPINE WITHOUT AND WITH CONTRAST  TECHNIQUE: Multiplanar and multiecho pulse sequences of the cervical spine, to include the craniocervical junction and cervicothoracic junction, were obtained without and with intravenous contrast.  CONTRAST:  36mL GADAVIST GADOBUTROL 1 MMOL/ML IV SOLN  COMPARISON:  Radiograph from 01/10/2020.  FINDINGS: Alignment: Straightening of the normal cervical lordosis. No listhesis.  Vertebrae: Vertebral body height maintained without acute or chronic fracture. Bone marrow signal intensity within normal limits. No discrete or worrisome osseous lesions or abnormal marrow edema.  Cord: Normal signal and morphology.  Posterior Fossa, vertebral arteries, paraspinal tissues: Craniocervical junction normal. Paraspinous soft tissues  within normal limits. Normal flow voids seen within the vertebral arteries bilaterally.  Disc levels:  C2-C3: Unremarkable.  C3-C4: Small central disc protrusion minimally indents the ventral thecal sac (series 13, image 34). No significant spinal stenosis. Mild uncovertebral spurring without significant foraminal encroachment.  C4-C5: Minimal disc bulge with uncovertebral hypertrophy. No spinal stenosis. Foramina remain patent.  C5-C6: Small central disc protrusion indents the ventral thecal sac (series 13, image 63). No significant spinal stenosis. Mild uncovertebral spurring without significant foraminal encroachment.  C6-C7: Right paracentral disc protrusion indents the ventral thecal sac (series 13, image 77). No significant spinal stenosis or cord deformity. Foramina remain patent.  C7-T1: Negative interspace. Mild right-sided facet hypertrophy. No canal or foraminal stenosis.  IMPRESSION: 1. Mild degenerative spondylosis at C3-4 through C6-7 without significant spinal stenosis or neural impingement. No significant foraminal encroachment within the cervical spine. 2. Normal MRI of the cervical spinal cord. 3. Please note that the peripheral IV infiltrated upon administration of IV contrast. As a result, no postcontrast sequences are available for review.   Electronically Signed By: Rise Mu M.D. On: 09/22/2021 21:43   Clinical Course as of 09/22/21 7591  Wynelle Link Sep 22, 2021  1423 Case discussed with Dr. Caryl Pina who recommends MRI of the brain C-spine and thoracic spine with without contrast to rule out potential MS lesions. [AH]  1423 Case discussed with Dr. Stevie Kern who is excepted the patient in transfer. [AH]    Clinical Course User Index [AH] Arthor Captain, PA-C    Medical Decision Making Amount and/or Complexity of Data Reviewed Labs: ordered. Radiology: ordered.  Risk Prescription drug management.   Patient was noted to have  persistent left eye diplopia, dysmetria with left finger-to-nose, and right leg weakness and quadricep with previous provider.  Will obtain visual acuity.  MRI imaging pending at this time.  Patient reports that she has had significant improvement in her headache after receiving migraine cocktail.  She continues to endorse increasing sensitivity to the left side of her scalp.  Patient's IV failed with administration of contrast dye for MRI.  MRI imaging obtained was noncontrast only.  MRI results as noted above.  No acute findings of CVA or demyelination.  On serial reexamination patient reports improvement in weakness to right lower extremity.  Continues to report improvement in headache.  Endorses resolution of diplopia.  Patient is able to stand and ambulate.  Does continue to have some stiffness to right lower extremity.  I spoke with on-call radiologist Dr. Thomasena Edis regarding patient's symptoms, course of treatment, and imaging.  He advised to be discharged to follow-up with neurology in the outpatient setting.  Due to patient's reports of intermittent diplopia over the last month patient was advised to refrain from driving until she can be evaluated by neurology.  Ambulatory referral was placed to neurology.  Discussed results, findings, treatment and follow up. Patient advised of return precautions. Patient verbalized understanding and agreed with plan.         Haskel Schroeder, PA-C 09/23/21 0150    Haskel Schroeder, PA-C 09/23/21 0153

## 2021-09-22 NOTE — ED Triage Notes (Incomplete)
Pt presents today c/o migraine x 2 weeks, taking Butalbital but not helping. Last took around 8:30am today. Went to see PCP on 3/3 and got a shot in office but did not help either. Denies vomiting. States type of pain is different than regular migraine, describes as "piercing". ?

## 2021-09-22 NOTE — ED Notes (Addendum)
Pt started having migraines around 2/28 with double vision. Pt confirms she has high BP but is compliant with medication.  ? ?Pt had went to see PCP where she was prescribed medication that helped briefly but now it has came back  ? ?Pt states this morning when trying to get up her right leg felt stiff and hard to move.  ?

## 2021-09-22 NOTE — ED Provider Notes (Cosign Needed)
[facility]ENC[facility]PART[facility] Provider Note   CSN: [id number] Arrival date & time: 09/22/21  1259     History  Chief Complaint  Patient presents with   Migraine    Kathryn Morris is a 46 y.o. female with a past medical history of hypertension, diabetes and obesity who presents emergency department with chief complaint of 1 month of persistent headaches.  Patient complains of a left-sided headache with left-sided scalp sensitivity to touch.  She complains of parietal region throbbing daily headache.  She saw her primary care physician who gave her a shot on March 3 and prescribed Fioricet which she states gives her some mild relief.  Over the past month she has noticed changes in vision in her left eye.  She notices double vision in the left eye only.  She is also noticed that her right leg is weak and seems to be giving out at times.  She complains of a cramping sensation in her right anterior thigh.  She denies low back pain, saddle anesthesia, bowel or bladder incontinence.  She denies falls but is having some difficulty walking.   Migraine Associated symptoms include headaches.      Home Medications Prior to Admission medications   Medication Sig Start Date End Date Taking? Authorizing Provider  amLODipine (NORVASC) 10 MG tablet Take 1 tablet (10 mg total) by mouth daily. 04/30/21 07/29/21  Eber HongMiller, Brian, MD  aspirin EC 81 MG tablet Take 81 mg by mouth daily.     [provider]  Cyanocobalamin (VITAMIN B 12) 250 MCG LOZG Take by mouth.    [provider]  dexamethasone (DECADRON) 4 MG tablet Take 1 tablet (4 mg total) by mouth 2 (two) times daily with a meal. 07/23/18   Ivery QualeBryant, Hobson, PA-C  ferrous sulfate 325 (65 FE) MG tablet Take 325-650 mg by mouth 2 (two) times daily. Take 2 tablets by mouth in the AM and 1 tablet in the PM    [provider]  fluticasone (FLONASE) 50 MCG/ACT nasal spray Place 2 sprays into both nostrils daily. 11/12/17   Tommie Samsook, Jayce  G, DO  folic acid (FOLVITE) 400 MCG tablet Take 400 mcg by mouth daily.    [provider]  hydrochlorothiazide (HYDRODIURIL) 25 MG tablet Take 25 mg by mouth daily as needed (fluid).    [provider]  HYDROcodone-homatropine (HYCODAN) 5-1.5 MG/5ML syrup Take 5 mLs by mouth every 6 (six) hours as needed. 07/23/18   Ivery QualeBryant, Hobson, PA-C  losartan (COZAAR) 100 MG tablet TAKE 1 TABLET BY MOUTH ONCE DAILY. **PLEASE SCHEDULE APPT FOR FURTHER REFILLS** 02/23/17   Allred, Fayrene FearingJames, MD  metFORMIN (GLUCOPHAGE) 1000 MG tablet Take 1,000 mg by mouth 2 (two) times daily with a meal.    [provider]  metoprolol succinate (TOPROL-XL) 50 MG 24 hr tablet TAKE 1 TABLET BY MOUTH ONCE DAILY 02/23/17   Allred, Fayrene FearingJames, MD  Multiple Vitamin (MULTIVITAMIN WITH MINERALS) TABS tablet Take 1 tablet by mouth daily.    [provider]  naproxen (NAPROSYN) 500 MG tablet Take 1 tablet (500 mg total) by mouth 2 (two) times daily. 01/10/20   Burgess AmorIdol, Julie, PA-C  NOVOLIN 70/30 RELION (70-30) 100 UNIT/ML injection  11/09/17   [provider]      Allergies    Lisinopril    Review of Systems   Review of Systems  Eyes:  Positive for visual disturbance. Negative for photophobia.  Musculoskeletal:  Positive for gait problem.  Neurological:  Positive for  weakness and headaches.   Physical Exam Updated Vital Signs BP (!) 165/67    Pulse 78    Temp 99.3 F (37.4 C)    Resp 20    Ht 5\' 11"  (1.803 m)    Wt 132.5 kg    LMP 08/22/2021    SpO2 100%    BMI 40.73 kg/m  Physical Exam Vitals and nursing note reviewed.  Constitutional:      General: She is not in acute distress.    Appearance: She is well-developed. She is not diaphoretic.  HENT:     Head: Normocephalic and atraumatic.     Right Ear: External ear normal.     Left Ear: External ear normal.     Nose: Nose normal.     Mouth/Throat:     Mouth: Mucous membranes are moist.  Eyes:     General: No scleral icterus.     Conjunctiva/sclera: Conjunctivae normal.  Cardiovascular:     Rate and Rhythm: Normal rate and regular rhythm.     Heart sounds: Normal heart sounds. No murmur heard.   No friction rub. No gallop.  Pulmonary:     Effort: Pulmonary effort is normal. No respiratory distress.     Breath sounds: Normal breath sounds.  Abdominal:     General: Bowel sounds are normal. There is no distension.     Palpations: Abdomen is soft. There is no mass.     Tenderness: There is no abdominal tenderness. There is no guarding.  Musculoskeletal:     Cervical back: Normal range of motion.  Skin:    General: Skin is warm and dry.  Neurological:     Mental Status: She is alert and oriented to person, place, and time.     Cranial Nerves: Cranial nerve deficit present.     Sensory: Sensory deficit present.     Motor: Weakness present.     Coordination: Coordination abnormal.     Gait: Gait abnormal.     Deep Tendon Reflexes: Reflexes normal.     Comments: Hyperesthesia of the left parietal scalp region No lesions noted suggestive of zoster. No visual field deficits however persistent left eye diplopia on exam Dysmetria with left finger-to-nose Right leg weakness in the quadriceps, stiffed leg walking with abnormal gait  Psychiatric:        Behavior: Behavior normal.    ED Results / Procedures / Treatments   Labs (all labs ordered are listed, but only abnormal results are displayed) Labs Reviewed  BASIC METABOLIC PANEL - Abnormal; Notable for the following components:      Result Value   Glucose, Bld 172 (*)    All other components within normal limits  CBC - Abnormal; Notable for the following components:   Platelets 425 (*)    All other components within normal limits  SEDIMENTATION RATE - Abnormal; Notable for the following components:   Sed Rate 74 (*)    All other components within normal limits  RESP PANEL BY RT-PCR (FLU A&B, COVID) ARPGX2  POC URINE PREG, ED  I-STAT CHEM 8, ED     EKG None  Radiology No results found.  Procedures Procedures    Medications Ordered in ED Medications  LORazepam (ATIVAN) injection 1 mg (has no administration in time range)  metoCLOPramide (REGLAN) injection 5 mg (5 mg Intravenous Given 09/22/21 1457)  diphenhydrAMINE (BENADRYL) injection 12.5 mg (12.5 mg Intravenous Given 09/22/21 1458)    ED Course/ Medical Decision Making/ A&P Clinical Course as of 09/22/21  8315  Sun Sep 22, 2021  1423 Case discussed with Dr. Caryl Pina who recommends MRI of the brain C-spine and thoracic spine with without contrast to rule out potential MS lesions. [AH]  1423 Case discussed with Dr. Stevie Kern who is excepted the patient in transfer. [AH]    Clinical Course User Index [AH] Arthor Captain, PA-C                           Medical Decision Making 46 year old female here with a history of diabetes, hypertension and obesity here with persistent headache x1 month. Emergent considerations for headache include subarachnoid hemorrhage, meningitis, temporal arteritis, glaucoma, cerebral ischemia, carotid/vertebral dissection, intracranial tumor, Venous sinus thrombosis, carbon monoxide poisoning, acute or chronic subdural hemorrhage.  Other considerations include: Migraine, Cluster headache, Hypertension, Caffeine, alcohol, or drug withdrawal, Pseudotumor cerebri, Arteriovenous malformation, Head injury, Neurocysticercosis, Post-lumbar puncture, Preeclampsia, Tension headache, Sinusitis, Cervical arthritis, Refractive error causing strain, Dental abscess, Otitis media, Temporomandibular joint syndrome, Depression, Somatoform disorder (eg, somatization) Trigeminal neuralgia, Glossopharyngeal neuralgia. Patient has abnormal neurologic work-up including diplopia in the left eye, dysmetria of the left arm, and right leg weakness with abnormal gait.  I have highest concern for potential MS.  Case discussed as documented in ED course with Dr. Otelia Limes to  discuss appropriate imaging and I have discussed the case with Dr. Stevie Kern at [facility], ER who is excepted the patient in transfer.  Amount and/or Complexity of Data Reviewed Independent Historian:     Details: Family member at bedside Labs: ordered.    Details: CBC without significant abnormality, CMP with elevated blood glucose consistent with history of diabetes.  Sed rate significantly elevated at 74. Radiology: ordered.    Details: MRI of the brain C-spine and thoracic spine with and without contrast ordered and pending at [facility]  Risk Prescription drug management.            Final Clinical Impression(s) / ED Diagnoses Final diagnoses:  None    Rx / DC Orders ED Discharge Orders     None         Arthor Captain, PA-C 09/22/21 1612

## 2021-09-22 NOTE — Progress Notes (Signed)
Completed pt's currently ordered MRI. Pt's IV extravasated during contrast administration. IV saline flush initially pushed well after slight blood return (pt stated felt ok) but pt began to feel a burning sensation towards end of contrast administration. Immediately stopped injecting. Site appeared ok but began to swell slightly in the following minute or so. Removed IV (catheter came out clean). A cool compress was applied per pt request upon explaining to pt what happened. Pt endorsed burning sensation and tenderness at injection site. No other expressed symptoms. I had initially thought that, prior to extravasation, that some of the contrast got in but, in the following post contrast imaging, no contrast was seen. Confirmed this with Dr Phill Myron (Radiologist). Per Dr. Phill Myron based on pre contrast imaging, no need for post contrast imaging and to complete studies as is, without further contrast injection resulting in no contrasted imaging. Exams left ordered as W WO as pt still received the injection of contrast. I informed pt's RN, ordering provider, and attending provider of the situation. Total injected volume was ~86mL (65mL Saline followed by ~64mL Gadavist contrast) Unsure of how much of either made it into vein before actual extravasation took place. ?

## 2021-09-22 NOTE — Discharge Instructions (Addendum)
You came to the emergency department today to be evaluated for your headache, double vision, and right leg weakness.  Your MRI did not show any acute abnormalities.  You will need to follow-up with [facility] neurology in the outpatient setting.  I have placed a referral for this visit.  If you do not hear from them in 3 business days please call to schedule follow-up appointment.  Please refrain from driving until you can be cleared by neurology. ? ?Get help right away if: ?Your headache: ?Becomes severe quickly. ?Gets worse after moderate to intense physical activity. ?You have any of these symptoms: ?Repeated vomiting. ?Pain or stiffness in your neck. ?Changes to your vision. ?Pain in an eye or ear. ?Problems with speech. ?Muscular weakness or loss of muscle control. ?Loss of balance or coordination. ?You feel faint or pass out. ?You have confusion. ?You have a seizure. ?

## 2021-11-15 ENCOUNTER — Ambulatory Visit: Payer: Self-pay

## 2022-03-14 ENCOUNTER — Other Ambulatory Visit: Payer: Self-pay | Admitting: Registered Nurse

## 2022-03-14 DIAGNOSIS — Z1231 Encounter for screening mammogram for malignant neoplasm of breast: Secondary | ICD-10-CM

## 2022-04-07 ENCOUNTER — Ambulatory Visit
Admission: RE | Admit: 2022-04-07 | Discharge: 2022-04-07 | Disposition: A | Discharge status: Home / Self Care | Payer: Managed Care, Other (non HMO) | Source: Ambulatory Visit | Attending: Registered Nurse | Admitting: Registered Nurse

## 2022-04-07 DIAGNOSIS — Z1231 Encounter for screening mammogram for malignant neoplasm of breast: Secondary | ICD-10-CM | POA: Diagnosis not present

## 2022-08-28 ENCOUNTER — Encounter (INDEPENDENT_AMBULATORY_CARE_PROVIDER_SITE_OTHER): Payer: Medicaid Other

## 2022-08-28 ENCOUNTER — Encounter (INDEPENDENT_AMBULATORY_CARE_PROVIDER_SITE_OTHER): Payer: Medicaid Other | Admitting: Vascular Surgery

## 2022-10-09 ENCOUNTER — Emergency Department
Admission: EM | Admit: 2022-10-09 | Discharge: 2022-10-09 | Disposition: A | Discharge status: Home / Self Care | Payer: Managed Care, Other (non HMO) | Attending: Emergency Medicine | Admitting: Emergency Medicine

## 2022-10-09 ENCOUNTER — Emergency Department: Payer: Managed Care, Other (non HMO)

## 2022-10-09 ENCOUNTER — Other Ambulatory Visit: Payer: Self-pay

## 2022-10-09 ENCOUNTER — Encounter: Payer: Self-pay | Admitting: Intensive Care

## 2022-10-09 DIAGNOSIS — R519 Headache, unspecified: Secondary | ICD-10-CM

## 2022-10-09 DIAGNOSIS — E109 Type 1 diabetes mellitus without complications: Secondary | ICD-10-CM | POA: Insufficient documentation

## 2022-10-09 DIAGNOSIS — I10 Essential (primary) hypertension: Secondary | ICD-10-CM | POA: Insufficient documentation

## 2022-10-09 LAB — CBC
HCT: 34.4 % — ABNORMAL LOW (ref 36.0–46.0)
Hemoglobin: 10.9 g/dL — ABNORMAL LOW (ref 12.0–15.0)
MCH: 30 pg (ref 26.0–34.0)
MCHC: 31.7 g/dL (ref 30.0–36.0)
MCV: 94.8 fL (ref 80.0–100.0)
Platelets: 319 10*3/uL (ref 150–400)
RBC: 3.63 MIL/uL — ABNORMAL LOW (ref 3.87–5.11)
RDW: 13.7 % (ref 11.5–15.5)
WBC: 7.8 10*3/uL (ref 4.0–10.5)
nRBC: 0 % (ref 0.0–0.2)

## 2022-10-09 LAB — BASIC METABOLIC PANEL
Anion gap: 12 (ref 5–15)
BUN: 13 mg/dL (ref 6–20)
CO2: 23 mmol/L (ref 22–32)
Calcium: 8.9 mg/dL (ref 8.9–10.3)
Chloride: 100 mmol/L (ref 98–111)
Creatinine, Ser: 0.99 mg/dL (ref 0.44–1.00)
GFR, Estimated: >60 mL/min (ref >60)
Glucose, Bld: 316 mg/dL — ABNORMAL HIGH (ref 70–99)
Potassium: 3.4 mmol/L — ABNORMAL LOW (ref 3.5–5.1)
Sodium: 135 mmol/L (ref 135–145)

## 2022-10-09 LAB — BLOOD GAS, VENOUS
Acid-Base Excess: 0.3 mmol/L (ref 0.0–2.0)
Bicarbonate: 25.4 mmol/L (ref 20.0–28.0)
O2 Saturation: 77.4 %
Patient temperature: 37
pCO2, Ven: 42 mmHg — ABNORMAL LOW (ref 44–60)
pH, Ven: 7.39 (ref 7.25–7.43)
pO2, Ven: 47 mmHg — ABNORMAL HIGH (ref 32–45)

## 2022-10-09 LAB — TROPONIN I (HIGH SENSITIVITY)
Troponin I (High Sensitivity): 4 ng/L (ref <18)
Troponin I (High Sensitivity): 7 ng/L (ref <18)

## 2022-10-09 MED ORDER — METOPROLOL SUCCINATE ER 50 MG PO TB24
50.0000 mg | ORAL_TABLET | Freq: Once | ORAL | Status: AC
  Administered 2022-10-09: 50 mg via ORAL
  Filled 2022-10-09: qty 1

## 2022-10-09 NOTE — Discharge Instructions (Addendum)
Please take your normal blood pressure regimen tomorrow morning.  2 hours after you take your morning dose, please take your blood pressure and if your systolic (top number) is above 170, please take the second 50 mg metoprolol.  Please continue this regimen until follow-up with Dr. Clayborn Bigness

## 2022-10-09 NOTE — ED Notes (Signed)
Pt discharge to home. Pt VSS, GCS 15, NAD. Pt verbalized understanding of discharge instructions with no additional questions at this time.  

## 2022-10-09 NOTE — ED Triage Notes (Signed)
First nurse note: Pt here via [facility] with c/o of HTN, EMS also states pt has N/V. EMS also states hyperglycemia, EMS gave 10 mg of labetolol.   20G L FA.

## 2022-10-09 NOTE — ED Triage Notes (Signed)
Patient arrived by EMS from work for hypertension, hyperglycemia, and headache. While at work she started to have headache, nausea, diaphoretic, and dizziness.   A&O x4 in triage  EMS administered 10mg  labetolol and 4mg  zofran  History of hypertension and diabetes

## 2022-10-09 NOTE — ED Provider Notes (Signed)
[facility] Provider Note   Event Date/Time   First MD Initiated Contact with Patient 10/09/22 1233     (approximate) History  Hypertension and Headache  HPI Kathryn Morris is a 47 y.o. female with a stated past medical history of insulin-dependent diabetes and poorly controlled hypertension who presents complaining of headache, nausea, vomiting, and vertigo symptoms that began approximately 2 hours prior to arrival and was associated with extremely high blood pressure vertigo symptoms that began approximately 2 hours prior to arrival and was associated with extremely high blood pressure.  EMS arrived and noted patient's systolic blood pressure to be in the 240s before administering 10 mg of labetalol IV.  Patient arrives with blood pressures of 173/71 and significantly improved symptoms.  Patient states that she no longer has a headache, chest pain, does not feel nauseous, and only has mild vertigo symptoms. ROS: Patient currently denies any vision changes, tinnitus, difficulty speaking, facial droop, sore throat, chest pain, shortness of breath, abdominal pain, diarrhea, dysuria, or weakness/numbness/paresthesias in any extremity   Physical Exam  Triage Vital Signs: ED Triage Vitals [10/09/22 1219]  Enc Vitals Group     BP (!) 173/71     Pulse Rate 62     Resp 16     Temp 99 F (37.2 C)     Temp Source Oral     SpO2 97 %     Weight 288 lb (130.6 kg)     Height 5\' 11"  (1.803 m)     Head Circumference      Peak Flow      Pain Score 0     Pain Loc      Pain Edu?      Excl. in [place]?    Most recent vital signs: Vitals:   10/09/22 1330 10/09/22 1400  BP: (!) 159/69 (!) 163/71  Pulse: 69 72  Resp: 17 19  Temp:    SpO2: 98% 95%   General: Awake, oriented x4. CV:  Good peripheral perfusion.  Resp:  Normal effort.  Abd:  No distention.  Other:  Middle-aged morbidly obese African-American female laying in bed in no acute distress.  NIHSS 0 ED Results /  Procedures / Treatments  Labs (all labs ordered are listed, but only abnormal results are displayed) Labs Reviewed  BASIC METABOLIC PANEL - Abnormal; Notable for the following components:      Result Value   Potassium 3.4 (*)    Glucose, Bld 316 (*)    All other components within normal limits  CBC - Abnormal; Notable for the following components:   RBC 3.63 (*)    Hemoglobin 10.9 (*)    HCT 34.4 (*)    All other components within normal limits  BLOOD GAS, VENOUS - Abnormal; Notable for the following components:   pCO2, Ven 42 (*)    pO2, Ven 47 (*)    All other components within normal limits  TROPONIN I (HIGH SENSITIVITY)  TROPONIN I (HIGH SENSITIVITY)   EKG ED ECG REPORT I, Naaman Plummer, the attending physician, personally viewed and interpreted this ECG. Date: 10/09/2022 EKG Time: 1224 Rate: 76 Rhythm: normal sinus rhythm QRS Axis: normal Intervals: normal ST/T Wave abnormalities: normal Narrative Interpretation: no evidence of acute ischemia RADIOLOGY ED MD interpretation: 2 view chest x-ray interpreted by me shows no evidence of acute abnormalities including no pneumonia, pneumothorax, or widened mediastinum -Agree with radiology assessment Official radiology report(s): DG Chest 2 View  Result Date: 10/09/2022  CLINICAL DATA:  Hypertension. EXAM: CHEST - 2 VIEW COMPARISON:  Chest radiograph 04/30/2021 FINDINGS: Of note, the lower lateral right ribs are not included on the field of view. The heart size and mediastinal contours are within normal limits. Both lungs are clear. No pleural effusion or pneumothorax. Multilevel degenerative changes in the mid thoracic spine. IMPRESSION: No active cardiopulmonary disease. Electronically Signed   By: Ileana Roup M.D.   On: 10/09/2022 13:02   PROCEDURES: Critical Care performed: No .1-3 Lead EKG Interpretation  Performed by: Naaman Plummer, MD Authorized by: Naaman Plummer, MD     Interpretation: normal     ECG rate:   71   ECG rate assessment: normal     Rhythm: sinus rhythm     Ectopy: none     Conduction: normal    MEDICATIONS ORDERED IN ED: Medications  metoprolol succinate (TOPROL-XL) 24 hr tablet 50 mg (50 mg Oral Given 10/09/22 1341)   IMPRESSION / MDM / ASSESSMENT AND PLAN / ED COURSE  I reviewed the triage vital signs and the nursing notes.                             The patient is on the cardiac monitor to evaluate for evidence of arrhythmia and/or significant heart rate changes. Patient's presentation is most consistent with acute presentation with potential threat to life or bodily function. Presents to the emergency department complaining of high blood pressure. Patient is otherwise asymptomatic without confusion, chest pain, hematuria, or SOB. Denies Nonadherence to antihypertensive regimen DDx: CV, AMI, heart failure, renal infarction or failure or other end organ damage.  Disposition: Discussed with patient their elevated blood pressure and need for close outpatient management of their hypertension. Will provide a prescription for the patients previous antihypertensive medication and arrange for the patient to follow up in a primary care clinic    FINAL CLINICAL IMPRESSION(S) / ED DIAGNOSES   Final diagnoses:  Uncontrolled hypertension  Acute nonintractable headache, unspecified headache type   Rx / DC Orders   ED Discharge Orders     None      Note:  This document was prepared using Dragon voice recognition software and may include unintentional dictation errors.   Naaman Plummer, MD 10/09/22 [phone]

## 2022-10-16 ENCOUNTER — Emergency Department (HOSPITAL_COMMUNITY)
Admission: EM | Admit: 2022-10-16 | Discharge: 2022-10-16 | Disposition: A | Discharge status: Home / Self Care | Payer: Managed Care, Other (non HMO) | Attending: Emergency Medicine | Admitting: Emergency Medicine

## 2022-10-16 ENCOUNTER — Other Ambulatory Visit: Payer: Self-pay

## 2022-10-16 ENCOUNTER — Encounter: Payer: Self-pay | Admitting: Neurology

## 2022-10-16 ENCOUNTER — Encounter (HOSPITAL_COMMUNITY): Payer: Self-pay

## 2022-10-16 ENCOUNTER — Emergency Department (HOSPITAL_COMMUNITY): Payer: Managed Care, Other (non HMO)

## 2022-10-16 DIAGNOSIS — Z794 Long term (current) use of insulin: Secondary | ICD-10-CM | POA: Diagnosis not present

## 2022-10-16 DIAGNOSIS — R531 Weakness: Secondary | ICD-10-CM | POA: Diagnosis not present

## 2022-10-16 DIAGNOSIS — Z7982 Long term (current) use of aspirin: Secondary | ICD-10-CM | POA: Diagnosis not present

## 2022-10-16 DIAGNOSIS — D649 Anemia, unspecified: Secondary | ICD-10-CM | POA: Insufficient documentation

## 2022-10-16 DIAGNOSIS — I10 Essential (primary) hypertension: Secondary | ICD-10-CM | POA: Insufficient documentation

## 2022-10-16 DIAGNOSIS — R200 Anesthesia of skin: Secondary | ICD-10-CM | POA: Insufficient documentation

## 2022-10-16 DIAGNOSIS — Z79899 Other long term (current) drug therapy: Secondary | ICD-10-CM | POA: Diagnosis not present

## 2022-10-16 DIAGNOSIS — Z7984 Long term (current) use of oral hypoglycemic drugs: Secondary | ICD-10-CM | POA: Insufficient documentation

## 2022-10-16 DIAGNOSIS — E119 Type 2 diabetes mellitus without complications: Secondary | ICD-10-CM | POA: Diagnosis not present

## 2022-10-16 LAB — RAPID URINE DRUG SCREEN, HOSP PERFORMED
Amphetamines: NOT DETECTED
Barbiturates: NOT DETECTED
Benzodiazepines: NOT DETECTED
Cocaine: NOT DETECTED
Opiates: NOT DETECTED
Tetrahydrocannabinol: NOT DETECTED

## 2022-10-16 LAB — I-STAT CHEM 8, ED
BUN: 5 mg/dL — ABNORMAL LOW (ref 6–20)
Calcium, Ion: 1.2 mmol/L (ref 1.15–1.40)
Chloride: 101 mmol/L (ref 98–111)
Creatinine, Ser: 0.7 mg/dL (ref 0.44–1.00)
Glucose, Bld: 217 mg/dL — ABNORMAL HIGH (ref 70–99)
HCT: 37 % (ref 36.0–46.0)
Hemoglobin: 12.6 g/dL (ref 12.0–15.0)
Potassium: 4.1 mmol/L (ref 3.5–5.1)
Sodium: 141 mmol/L (ref 135–145)
TCO2: 27 mmol/L (ref 22–32)

## 2022-10-16 LAB — COMPREHENSIVE METABOLIC PANEL
ALT: 33 U/L (ref 0–44)
AST: 33 U/L (ref 15–41)
Albumin: 3.6 g/dL (ref 3.5–5.0)
Alkaline Phosphatase: 125 U/L (ref 38–126)
Anion gap: 9 (ref 5–15)
BUN: 7 mg/dL (ref 6–20)
CO2: 26 mmol/L (ref 22–32)
Calcium: 8.8 mg/dL — ABNORMAL LOW (ref 8.9–10.3)
Chloride: 102 mmol/L (ref 98–111)
Creatinine, Ser: 0.8 mg/dL (ref 0.44–1.00)
GFR, Estimated: >60 mL/min (ref >60)
Glucose, Bld: 218 mg/dL — ABNORMAL HIGH (ref 70–99)
Potassium: 4 mmol/L (ref 3.5–5.1)
Sodium: 137 mmol/L (ref 135–145)
Total Bilirubin: 0.5 mg/dL (ref 0.3–1.2)
Total Protein: 7.9 g/dL (ref 6.5–8.1)

## 2022-10-16 LAB — CBC
HCT: 36.8 % (ref 36.0–46.0)
Hemoglobin: 11.9 g/dL — ABNORMAL LOW (ref 12.0–15.0)
MCH: 30.1 pg (ref 26.0–34.0)
MCHC: 32.3 g/dL (ref 30.0–36.0)
MCV: 92.9 fL (ref 80.0–100.0)
Platelets: 394 10*3/uL (ref 150–400)
RBC: 3.96 MIL/uL (ref 3.87–5.11)
RDW: 13.6 % (ref 11.5–15.5)
WBC: 6.7 10*3/uL (ref 4.0–10.5)
nRBC: 0 % (ref 0.0–0.2)

## 2022-10-16 LAB — APTT: aPTT: 26 seconds (ref 24–36)

## 2022-10-16 LAB — DIFFERENTIAL
Abs Immature Granulocytes: 0.05 10*3/uL (ref 0.00–0.07)
Basophils Absolute: 0 10*3/uL (ref 0.0–0.1)
Basophils Relative: 1 %
Eosinophils Absolute: 0.2 10*3/uL (ref 0.0–0.5)
Eosinophils Relative: 2 %
Immature Granulocytes: 1 %
Lymphocytes Relative: 34 %
Lymphs Abs: 2.2 10*3/uL (ref 0.7–4.0)
Monocytes Absolute: 0.4 10*3/uL (ref 0.1–1.0)
Monocytes Relative: 5 %
Neutro Abs: 3.8 10*3/uL (ref 1.7–7.7)
Neutrophils Relative %: 57 %

## 2022-10-16 LAB — URINALYSIS, ROUTINE W REFLEX MICROSCOPIC
Bilirubin Urine: NEGATIVE
Glucose, UA: 50 mg/dL — AB
Hgb urine dipstick: NEGATIVE
Ketones, ur: NEGATIVE mg/dL
Leukocytes,Ua: NEGATIVE
Nitrite: NEGATIVE
Protein, ur: NEGATIVE mg/dL
Specific Gravity, Urine: 1.006 (ref 1.005–1.030)
pH: 7 (ref 5.0–8.0)

## 2022-10-16 LAB — PROTIME-INR
INR: 1 (ref 0.8–1.2)
Prothrombin Time: 13.5 seconds (ref 11.4–15.2)

## 2022-10-16 LAB — ETHANOL: Alcohol, Ethyl (B): <10 mg/dL (ref <10)

## 2022-10-16 LAB — I-STAT BETA HCG BLOOD, ED (MC, WL, AP ONLY): I-stat hCG, quantitative: <5 m[IU]/mL (ref <5)

## 2022-10-16 NOTE — ED Notes (Signed)
Patient transported to CT 

## 2022-10-16 NOTE — ED Notes (Signed)
EDP at bedside  

## 2022-10-16 NOTE — Discharge Instructions (Signed)
Blood pressure improved here but not ideal.  Follow-up with your cardiologist in [facility] for blood pressure adjustments as needed.  Make an appointment follow-up with [facility] neurology.  MRI today without any evidence of stroke.  Also make an appointment follow-up with your primary care doctor.

## 2022-10-16 NOTE — ED Notes (Signed)
EDP notified of NIH reading and LKW 1730 on 10/15/22, pt awoke with symptoms at 0600 on 10/16/22.

## 2022-10-16 NOTE — ED Triage Notes (Signed)
Patient comes in with HTN. Pt states 181/92 with home BP system. Patient states onset of right arm numbness and tingling this morning since she woke up. Patient denies pain at this time. Patient was seen at [facility] last week for same complaint and was changed from her 50 mg Metoprolol to 100 mg once a day.

## 2022-10-16 NOTE — ED Notes (Signed)
Patient transported to MRI 

## 2022-10-16 NOTE — ED Provider Notes (Signed)
[facility] Provider Note   CSN: [id number] Arrival date & time: 10/16/22  0720     History  Chief Complaint  Patient presents with   Hypertension    Kathryn Kathryn is a 47 y.o. female.  Patient brought in by POV.  Patient states she is Blood pressure been running high 181/92 did take her blood pressure medicine this morning.  States that she had the onset of right arm numbness and weakness and right leg weakness and also visual changes that started yesterday evening at 1800.  Initial blood pressure here 184/68.  Patient denies any other symptoms.  Past medical history sniffer diabetes hypertension supraventricular tachycardia.  Patient seen at [facility] on March 28 for uncontrolled hypertension patient seen March 12 for migraine headache.  Patient also seen April 30, 2021 for primary hypertension.  Patient's last MRI was September 22, 2021.  When she was seen for the migraine.  It was negative at that time.  Patient is followed by the [facility] also followed by [facility] cardiology at [facility].       Home Medications Prior to Admission medications   Medication Sig Start Date End Date Taking? Authorizing Provider  amLODipine (NORVASC) 10 MG tablet Take 1 tablet (10 mg total) by mouth daily. 04/30/21 07/29/21  Noemi Chapel, MD  aspirin EC 81 MG tablet Take 81 mg by mouth daily.     [provider]  Cyanocobalamin (VITAMIN B 12) 250 MCG LOZG Take by mouth.    [provider]  dexamethasone (DECADRON) 4 MG tablet Take 1 tablet (4 mg total) by mouth 2 (two) times daily with a meal. 07/23/18   Lily Kocher, PA-C  ferrous sulfate 325 (65 FE) MG tablet Take 325-650 mg by mouth 2 (two) times daily. Take 2 tablets by mouth in the AM and 1 tablet in the PM    [provider]  fluticasone (FLONASE) 50 MCG/ACT nasal spray Place 2 sprays into both nostrils daily. 11/12/17   Coral Spikes, DO  folic acid (FOLVITE)  A999333 MCG tablet Take 400 mcg by mouth daily.    [provider]  hydrochlorothiazide (HYDRODIURIL) 25 MG tablet Take 25 mg by mouth daily as needed (fluid).    [provider]  HYDROcodone-homatropine (HYCODAN) 5-1.5 MG/5ML syrup Take 5 mLs by mouth every 6 (six) hours as needed. 07/23/18   Lily Kocher, PA-C  losartan (COZAAR) 100 MG tablet TAKE 1 TABLET BY MOUTH ONCE DAILY. **PLEASE SCHEDULE APPT FOR FURTHER REFILLS** 02/23/17   Allred, Jeneen Rinks, MD  metFORMIN (GLUCOPHAGE) 1000 MG tablet Take 1,000 mg by mouth 2 (two) times daily with a meal.    [provider]  metoprolol succinate (TOPROL-XL) 50 MG 24 hr tablet TAKE 1 TABLET BY MOUTH ONCE DAILY 02/23/17   Allred, Jeneen Rinks, MD  Multiple Vitamin (MULTIVITAMIN WITH MINERALS) TABS tablet Take 1 tablet by mouth daily.    [provider]  naproxen (NAPROSYN) 500 MG tablet Take 1 tablet (500 mg total) by mouth 2 (two) times daily. 01/10/20   Evalee Jefferson, PA-C  NOVOLIN 70/30 RELION (70-30) 100 UNIT/ML injection  11/09/17   [provider]      Allergies    Lisinopril    Review of Systems   Review of Systems  Constitutional:  Negative for chills and fever.  HENT:  Negative for ear pain and sore throat.   Eyes:  Positive for visual disturbance. Negative for pain.  Respiratory:  Negative for cough and shortness of breath.   Cardiovascular:  Negative for chest pain and palpitations.  Gastrointestinal:  Negative for abdominal pain and vomiting.  Genitourinary:  Negative for dysuria and hematuria.  Musculoskeletal:  Negative for arthralgias and back pain.  Skin:  Negative for color change and rash.  Neurological:  Positive for weakness and numbness. Negative for seizures and syncope.  All other systems reviewed and are negative.   Physical Exam Updated Vital Signs BP (!) 156/66 (BP Location: Left Arm)   Pulse 76   Temp 97.9 F (36.6 C) (Oral)   Resp 19   Ht 1.803 m (5\' 11" )   Wt 128.8 kg   LMP 10/05/2022  (Within Days)   SpO2 98%   BMI 39.61 kg/m  Physical Exam Vitals and nursing note reviewed.  Constitutional:      General: She is not in acute distress.    Appearance: Normal appearance. She is well-developed.  HENT:     Head: Normocephalic and atraumatic.  Eyes:     Extraocular Movements: Extraocular movements intact.     Conjunctiva/sclera: Conjunctivae normal.     Pupils: Pupils are equal, round, and reactive to light.  Cardiovascular:     Rate and Rhythm: Normal rate and regular rhythm.     Heart sounds: No murmur heard. Pulmonary:     Effort: Pulmonary effort is normal. No respiratory distress.     Breath sounds: Normal breath sounds.  Abdominal:     Palpations: Abdomen is soft.     Tenderness: There is no abdominal tenderness.  Musculoskeletal:        General: No swelling.     Cervical back: Normal range of motion and neck supple.  Skin:    General: Skin is warm and dry.     Capillary Refill: Capillary refill takes less than 2 seconds.  Neurological:     Mental Status: She is alert.     Cranial Nerves: No cranial nerve deficit.     Sensory: Sensory deficit present.     Motor: Weakness present.  Psychiatric:        Mood and Affect: Mood normal.     ED Results / Procedures / Treatments   Labs (all labs ordered are listed, but only abnormal results are displayed) Labs Reviewed  CBC - Abnormal; Notable for the following components:      Result Value   Hemoglobin 11.9 (*)    All other components within normal limits  COMPREHENSIVE METABOLIC PANEL - Abnormal; Notable for the following components:   Glucose, Bld 218 (*)    Calcium 8.8 (*)    All other components within normal limits  URINALYSIS, ROUTINE W REFLEX MICROSCOPIC - Abnormal; Notable for the following components:   Color, Urine STRAW (*)    Glucose, UA 50 (*)    All other components within normal limits  I-STAT CHEM 8, ED - Abnormal; Notable for the following components:   BUN 5 (*)    Glucose, Bld  217 (*)    All other components within normal limits  PROTIME-INR  APTT  DIFFERENTIAL  ETHANOL  RAPID URINE DRUG SCREEN, HOSP PERFORMED  I-STAT BETA HCG BLOOD, ED ([facility], [facility], [facility] ONLY)    EKG EKG Interpretation  Date/Time:  Thursday October 16 2022 08:10:20 EDT Ventricular Rate:  61 PR Interval:  170 QRS Duration: 88 QT Interval:  397 QTC Calculation: 400 R Axis:   9 Text Interpretation: Sinus rhythm Probable left ventricular hypertrophy Confirmed by Fredia Sorrow [phone])  on 10/16/2022 8:24:51 AM  Radiology MR Brain Wo Contrast (neuro protocol)  Result Date: 10/16/2022 CLINICAL DATA:  Neuro deficit. Weakness, blurry vision, right-sided numbness EXAM: MRI HEAD WITHOUT CONTRAST TECHNIQUE: Multiplanar, multiecho pulse sequences of the brain and surrounding structures were obtained without intravenous contrast. COMPARISON:  Same-day CT head and brain MRI 09/22/2021 FINDINGS: Brain: There is no acute intracranial hemorrhage, extra-axial fluid collection, or acute infarct. Parenchymal volume is normal. The ventricles are normal in size. Parenchymal signal is normal. The pituitary and suprasellar region are normal. There is no mass lesion there is no mass effect or midline shift. Vascular: Normal flow voids. Skull and upper cervical spine: Normal marrow signal. Sinuses/Orbits: There is a retention cyst in the left maxillary sinus. The globes and orbits are unremarkable. Other: None. IMPRESSION: Unremarkable brain MRI with no acute intracranial pathology. Electronically Signed   By: Valetta Mole M.D.   On: 10/16/2022 11:39   CT HEAD WO CONTRAST  Result Date: 10/16/2022 CLINICAL DATA:  Provided history: Weakness. Blurred vision. Right-sided numbness. EXAM: CT HEAD WITHOUT CONTRAST TECHNIQUE: Contiguous axial images were obtained from the base of the skull through the vertex without intravenous contrast. RADIATION DOSE REDUCTION: This exam was performed according to the departmental dose-optimization  program which includes automated exposure control, adjustment of the mA and/or kV according to patient size and/or use of iterative reconstruction technique. COMPARISON:  Brain MRI 09/22/2021. Head CT 01/10/2020. FINDINGS: Brain: No age advanced or lobar predominant parenchymal atrophy. There is no acute intracranial hemorrhage. No demarcated cortical infarct. No extra-axial fluid collection. No evidence of an intracranial mass. No midline shift. Vascular: No hyperdense vessel. Atherosclerotic calcifications. Skull: No fracture or aggressive osseous lesion. Sinuses/Orbits: No mass or acute finding within the imaged orbits. No significant paranasal sinus disease at the imaged levels at the imaged levels. IMPRESSION: No evidence of an acute intracranial abnormality. Electronically Signed   By: Kellie Simmering D.O.   On: 10/16/2022 08:56    Procedures Procedures    Medications Ordered in ED Medications - No data to display  ED Course/ Medical Decision Making/ A&P                             Medical Decision Making Amount and/or Complexity of Data Reviewed Labs: ordered. Radiology: ordered.   CRITICAL CARE Performed by: Fredia Sorrow Total critical care time: 40 minutes Critical care time was exclusive of separately billable procedures and treating other patients. Critical care was necessary to treat or prevent imminent or life-threatening deterioration. Critical care was time spent personally by me on the following activities: development of treatment plan with patient and/or surrogate as well as nursing, discussions with consultants, evaluation of patient's response to treatment, examination of patient, obtaining history from patient or surrogate, ordering and performing treatments and interventions, ordering and review of laboratory studies, ordering and review of radiographic studies, pulse oximetry and re-evaluation of patient's condition.  Patient with neuroexam and subjective complaints  concerning for CVA.  Patient has longstanding history of hypertension.  Patient did take her blood pressure medicine this morning prior to arrival.  Blood pressure did improved to a systolic of 0000000.  Currently systolics [id number].  Diastolics are below 90.  MRI negative for CVA.  Head CT also negative.  Labs drug screen negative urinalysis negative.  Pregnancy test negative.  Alcohol negative.  CBC no leukocytosis hemoglobin 11.9.  Complete metabolic panel blood sugar 218 renal function normal liver function test  normal.  Patient has had negative MRIs in the past.  Will have her follow-up with [facility] neurology.  See no reason for admission no MRI evidence of CVA.  Patient will need close follow-up with her cardiologist and primary care doctor for better blood pressure control.  Patient just had an adjustment done on her blood pressure medicines within the last week.  Do not feel the patient needs admission for hypertensive emergency.  Final Clinical Impression(s) / ED Diagnoses Final diagnoses:  Primary hypertension  Numbness  Weakness    Rx / DC Orders ED Discharge Orders     None         Fredia Sorrow, MD 10/16/22 1218

## 2022-10-24 NOTE — Progress Notes (Signed)
Initial neurology clinic note  Kathryn Morris MRN: [id number] DOB: [birth year]  Referring provider: Center, [facility]*  Primary care provider: Center, [facility]  Reason for consult:  right arm and leg numbness and weakness and vision changes  Subjective:  This is Ms. Kathryn Morris, a 47 y.o. right-handed female with a medical history of poorly controlled HTN and DM, OSA (not on CPAP), SVT s/p ablation, and migraines who presents to neurology clinic with right arm and leg numbness and weakness and vision changes. The patient is alone today.  Patient had very high BP at work around 10/14/22 and was sent to ED where her BP was 248/112. She had confusion and slurred speech or word finding difficulty. She was treated for hypertensive emergency. The neurologic symptoms resolved after about 10 minutes, about the time of arrival to ED. She had no further neurologic symptoms until 10/16/22. She woke up on 10/16/22 and checked her BP, which was 189/98. She felt numbness on the right side (right arm and leg). Patient presented to ED for symptoms. She also was seeing double. Her SBP in 180s at home and 160s in ED. As her BP improved, her vision changes resolved. CT head and MRI brain without evidence of stroke.  She continues to have numbness in the entire right arm and weakness in the right leg. She has some imbalance, but denies falls. She does endorse tingling in her right hand. She also endorses so shoulder pain and low back pain.  She denies ever having these types of symptoms prior to 10/14/22. She denies any symptoms on the left side of her body. She also mentions a history of migraines with photophobia, phonophobia, nausea, and vomiting last in 06/2022. She has not had similar headaches this year.  She has presented to the ED in similar uncontrolled HTN and DM in the past as well.  Of note, patient was diagnosed with OSA, but has not seen sleep medicine. She states she needs to make  the appointment.  She does not smoker. She does not drink EtOH. She denies any drug use.  MEDICATIONS:  Outpatient Encounter Medications as of 11/07/2022  Medication Sig   amLODipine (NORVASC) 10 MG tablet Take 1 tablet (10 mg total) by mouth daily.   aspirin EC 81 MG tablet Take 81 mg by mouth as needed for mild pain (palpitations).   Cyanocobalamin (VITAMIN B 12) 250 MCG LOZG Take by mouth.   ferrous sulfate 325 (65 FE) MG tablet Take 325-650 mg by mouth 2 (two) times daily. Take 2 tablets by mouth in the AM and 1 tablet in the PM   folic acid (FOLVITE) 400 MCG tablet Take 400 mcg by mouth daily.   hydrochlorothiazide (HYDRODIURIL) 25 MG tablet Take 25 mg by mouth daily as needed (fluid).   losartan (COZAAR) 100 MG tablet TAKE 1 TABLET BY MOUTH ONCE DAILY. **PLEASE SCHEDULE APPT FOR FURTHER REFILLS**   metFORMIN (GLUCOPHAGE) 1000 MG tablet Take 1,000 mg by mouth 2 (two) times daily with a meal.   metoprolol succinate (TOPROL-XL) 50 MG 24 hr tablet TAKE 1 TABLET BY MOUTH ONCE DAILY   Multiple Vitamin (MULTIVITAMIN WITH MINERALS) TABS tablet Take 1 tablet by mouth daily.   SEMGLEE, YFGN, 100 UNIT/ML Pen Inject 50 Units into the skin in the morning and at bedtime.   dexamethasone (DECADRON) 4 MG tablet Take 1 tablet (4 mg total) by mouth 2 (two) times daily with a meal. (Patient not taking: Reported on 11/07/2022)  fluticasone (FLONASE) 50 MCG/ACT nasal spray Place 2 sprays into both nostrils daily. (Patient not taking: Reported on 11/07/2022)   HYDROcodone-homatropine (HYCODAN) 5-1.5 MG/5ML syrup Take 5 mLs by mouth every 6 (six) hours as needed. (Patient not taking: Reported on 11/07/2022)   naproxen (NAPROSYN) 500 MG tablet Take 1 tablet (500 mg total) by mouth 2 (two) times daily. (Patient not taking: Reported on 11/07/2022)   NOVOLIN 70/30 RELION (70-30) 100 UNIT/ML injection  (Patient not taking: Reported on 11/07/2022)   No facility-administered encounter medications on file as of 11/07/2022.     PAST MEDICAL HISTORY: Past Medical History:  Diagnosis Date   Anemia    Diabetes mellitus without complication (HCC)    Hypertension    SVT (supraventricular tachycardia)     PAST SURGICAL HISTORY: Past Surgical History:  Procedure Laterality Date   ELECTROPHYSIOLOGIC STUDY N/A 01/01/2016   Procedure: SVT Ablation;  Surgeon: [name], MD;  Location: [facility] INVASIVE CV LAB;  Service: Cardiovascular;  Laterality: N/A;   NO PAST SURGERIES      ALLERGIES: Allergies  Allergen Reactions   Lisinopril Hives    FAMILY HISTORY: Family History  Problem Relation Age of Onset   Diabetes Mother    Heart failure Mother    Diabetes Sister     SOCIAL HISTORY: Social History   Tobacco Use   Smoking status: Never   Smokeless tobacco: Never  Vaping Use   Vaping Use: Never used  Substance Use Topics   Alcohol use: No   Drug use: No   Social History   Social History Narrative   Are you right handed or left handed? Right   Are you currently employed ?    What is your current occupation?    Do you live at home alone?yes   Who lives with you?    What type of home do you live in: 1 story or 2 story? one    Caffeine 1cup daily     Objective:  Vital Signs:  BP 136/68 (BP Location: Right Arm, Patient Position: Sitting, Cuff Size: Normal)   Pulse 84   Ht 5\' 11"  (1.803 m)   Wt 288 lb (130.6 kg)   LMP 10/05/2022 (Within Days)   SpO2 99%   BMI 40.17 kg/m   General: General appearance: Awake and alert. No distress. Cooperative with exam.  Skin: No obvious rash or jaundice. HEENT: Atraumatic. Anicteric. Lungs: Non-labored breathing on room air  Extremities: Peripheral edema in bilateral lower extremities. No obvious deformity.  Psych: Affect appropriate.  Neurological: Mental Status: Alert. Speech fluent. No pseudobulbar affect Cranial Nerves: CNII: No RAPD. Visual fields intact. CNIII, IV, VI: PERRL. No nystagmus. EOMI. CN V: Facial sensation diminished on right  compared to left. CN VII: Facial muscles symmetric and strong. No ptosis at rest. CN VIII: Hears finger rub well bilaterally. CN IX: No hypophonia. CN X: Palate elevates symmetrically. CN XI: Full strength shoulder shrug bilaterally. CN XII: Tongue protrusion full and midline. No atrophy or fasciculations. No significant dysarthria Motor: Tone is normal.  Strength is essentially 5/5 in bilateral upper and lower extremities. There is some give way weakness in the right arm and leg, but it is 5/5 with best effort. Reflexes:  Right Left   Bicep 2+ 2+   Tricep 2+ 2+   BrRad 2+ 2+   Knee 2+ 2+   Ankle 2+ 2+    Pathological Reflexes: Babinski: flexor response bilaterally Hoffman: absent bilaterally Troemner: absent bilaterally Sensation: Pinprick: Diminished to "1%"  of normal in right face, arm, and leg Coordination: Intact finger-to- nose-finger bilaterally. Romberg negative. Gait: Able to rise from chair with arms crossed unassisted. Normal, narrow-based gait. Able to walk on toes and heels.   Labs and Imaging review: Internal labs: Lab Results  Component Value Date   TSH 2.232 11/25/2015   Lab Results  Component Value Date   ESRSEDRATE 74 (H) 09/22/2021   10/16/22: CMP significant for glucose of 218 CBC unremarkable  External labs: HbA1c (03/19/18): 10.0 Lipid panel (03/19/18): TG 154, Cholesterol 157, LDL 84, HDL 42  Imaging: MRI brain wo contrast (10/16/22): FINDINGS: Brain: There is no acute intracranial hemorrhage, extra-axial fluid collection, or acute infarct.   Parenchymal volume is normal. The ventricles are normal in size. Parenchymal signal is normal.   The pituitary and suprasellar region are normal. There is no mass lesion there is no mass effect or midline shift.   Vascular: Normal flow voids.   Skull and upper cervical spine: Normal marrow signal.   Sinuses/Orbits: There is a retention cyst in the left maxillary sinus. The globes and orbits are  unremarkable.   Other: None.   IMPRESSION: Unremarkable brain MRI with no acute intracranial pathology.  CT head wo contrast (10/16/22): FINDINGS: Brain:   No age advanced or lobar predominant parenchymal atrophy.   There is no acute intracranial hemorrhage.   No demarcated cortical infarct.   No extra-axial fluid collection.   No evidence of an intracranial mass.   No midline shift.   Vascular: No hyperdense vessel. Atherosclerotic calcifications.   Skull: No fracture or aggressive osseous lesion.   Sinuses/Orbits: No mass or acute finding within the imaged orbits. No significant paranasal sinus disease at the imaged levels at the imaged levels.   IMPRESSION: No evidence of an acute intracranial abnormality.  MRI cervical spine w/wo contrast (09/22/21): FINDINGS: Alignment: Straightening of the normal cervical lordosis. No listhesis.   Vertebrae: Vertebral body height maintained without acute or chronic fracture. Bone marrow signal intensity within normal limits. No discrete or worrisome osseous lesions or abnormal marrow edema.   Cord: Normal signal and morphology.   Posterior Fossa, vertebral arteries, paraspinal tissues: Craniocervical junction normal. Paraspinous soft tissues within normal limits. Normal flow voids seen within the vertebral arteries bilaterally.   Disc levels:   C2-C3: Unremarkable.   C3-C4: Small central disc protrusion minimally indents the ventral thecal sac (series 13, image 34). No significant spinal stenosis. Mild uncovertebral spurring without significant foraminal encroachment.   C4-C5: Minimal disc bulge with uncovertebral hypertrophy. No spinal stenosis. Foramina remain patent.   C5-C6: Small central disc protrusion indents the ventral thecal sac (series 13, image 63). No significant spinal stenosis. Mild uncovertebral spurring without significant foraminal encroachment.   C6-C7: Right paracentral disc protrusion indents  the ventral thecal sac (series 13, image 77). No significant spinal stenosis or cord deformity. Foramina remain patent.   C7-T1: Negative interspace. Mild right-sided facet hypertrophy. No canal or foraminal stenosis.   IMPRESSION: 1. Mild degenerative spondylosis at C3-4 through C6-7 without significant spinal stenosis or neural impingement. No significant foraminal encroachment within the cervical spine. 2. Normal MRI of the cervical spinal cord. 3. Please note that the peripheral IV infiltrated upon administration of IV contrast. As a result, no postcontrast sequences are available for review.  MRI thoracic spine w/wo contrast (09/22/21): FINDINGS: Alignment: Vertebral bodies normally aligned with preservation of the normal thoracic kyphosis. No listhesis.   Vertebrae: Vertebral body height well maintained without acute or chronic fracture. Bone  marrow signal intensity within normal limits. Few scattered benign hemangiomata noted. No worrisome osseous lesions. No abnormal marrow edema.   Cord:  Normal signal and morphology.   Paraspinal and other soft tissues: Unremarkable.   Disc levels:   T4-5: Normal interspace. Right worse than left facet hypertrophy. No spinal stenosis. Mild right foraminal narrowing. Left neural foramina remains patent.   T5-6: Negative interspace. Left-sided facet hypertrophy. No significant canal or foraminal stenosis.   T6-7: Small right paracentral disc protrusion indents the ventral thecal sac (series 23, image 20). Minimal cord flattening without cord signal changes or significant spinal stenosis.   T7-8: Small central disc protrusion indents the ventral thecal sac (series 23, image 25). No significant spinal stenosis or cord deformity. Foramina remain patent.   T8-9: Small central disc protrusion minimally indents the ventral thecal sac (series 23, image 29). No significant canal or foraminal stenosis.   Otherwise, no other  significant disc pathology seen within the thoracic spine for patient age. No other canal or foraminal stenosis or evidence for neural impingement.   IMPRESSION: 1. Normal MRI appearance of the thoracic spinal cord. No findings to explain patient's symptoms. 2. Small disc protrusions at T6-7 through T8-9 without significant spinal stenosis. 3. Right greater than left facet hypertrophy at T4-5 with resultant mild right foraminal stenosis. 4. Please note that the peripheral IV infiltrated upon administration of IV contrast. As a result, no postcontrast imaging is available for review.  Assessment/Plan:  VAYLA WILHELMI is a 47 y.o. female who presents for evaluation of right arm and leg numbness and weakness and vision changes. Vision changes have resolved, but numbness and subjective weakness persists. She has a relevant medical history of poorly controlled HTN and DM, OSA (not on CPAP), SVT s/p ablation, and migraines. Her neurological examination is pertinent for give way weakness in the right arm and leg and diminished sensation in right face, arm, and leg compared to left. Available diagnostic data is significant for normal CT head and MRI brain on 10/16/22. The etiology of patient's symptoms is not clear currently. Stroke was not seen on MRI on day of symptoms. We discussed repeating as symptoms had not resolved as sometimes early stroke can be missed on MRI. Patient decline though. Hypertensive emergency is also possible as the cause of patient's symptoms. I would have expected her symptoms to have improved with improved BP though (as it is today).  PLAN: -Blood work: TSH, HbA1c, Lipid panel, B12 -Discussed repeating MRI brain and getting vessel imaging with CTA, but patient preferred to defer -Continue aspirin 81 mg daily -May add statin pending lipid panel -Stroke precautions discussed  -Return to clinic in 6 months or sooner if symptoms do not resolve or if there is new or worsening  symptoms  The impression above as well as the plan as outlined below were extensively discussed with the patient who voiced understanding. All questions were answered to their satisfaction.  When available, results of the above investigations and possible further recommendations will be communicated to the patient via telephone/MyChart. Patient to call office if not contacted after expected testing turnaround time.   Total time spent reviewing records, interview, history/exam, documentation, and coordination of care on day of encounter:  55 min   Thank you for allowing me to participate in patient's care.  If I can answer any additional questions, I would be pleased to do so.  Jacquelyne Balint, MD   CC: Center, [facility] [address] [place]  [address]  CC: Referring provider: Center, [facility] [address] [place],  [place] [address]

## 2022-11-06 ENCOUNTER — Encounter (INDEPENDENT_AMBULATORY_CARE_PROVIDER_SITE_OTHER): Payer: Self-pay

## 2022-11-06 ENCOUNTER — Encounter (INDEPENDENT_AMBULATORY_CARE_PROVIDER_SITE_OTHER): Payer: Self-pay | Admitting: Vascular Surgery

## 2022-11-07 ENCOUNTER — Encounter: Payer: Self-pay | Admitting: Neurology

## 2022-11-07 ENCOUNTER — Other Ambulatory Visit (INDEPENDENT_AMBULATORY_CARE_PROVIDER_SITE_OTHER): Payer: Managed Care, Other (non HMO)

## 2022-11-07 ENCOUNTER — Ambulatory Visit: Payer: Managed Care, Other (non HMO) | Admitting: Neurology

## 2022-11-07 VITALS — BP 136/68 | HR 84 | Ht 71.0 in | Wt 288.0 lb

## 2022-11-07 DIAGNOSIS — H539 Unspecified visual disturbance: Secondary | ICD-10-CM

## 2022-11-07 DIAGNOSIS — I161 Hypertensive emergency: Secondary | ICD-10-CM

## 2022-11-07 DIAGNOSIS — I10 Essential (primary) hypertension: Secondary | ICD-10-CM

## 2022-11-07 DIAGNOSIS — R200 Anesthesia of skin: Secondary | ICD-10-CM

## 2022-11-07 DIAGNOSIS — E119 Type 2 diabetes mellitus without complications: Secondary | ICD-10-CM

## 2022-11-07 NOTE — Patient Instructions (Addendum)
I saw you today for numbness on the right side of your body. Your MRI brain did not show evidence of stroke. We discussed repeating the MRI to be sure, but you would rather hold off on this. Your symptoms could be due to the high blood pressure as well. With better controlled blood pressure, perhaps your symptoms will improve. If you do not, or there is any change, please let me know.  If you have new difficulty speaking, face droop, numbness on one side of the body, weakness on one side of the body, or dizziness/imbalance, this could be the sign of a stroke. Don't wait, please call EMS and be evaluated at the nearest emergency room.  I would like to get some blood work today. I will be in touch when I have those results.  I would like to see you back in clinic in 6 months, assuming your symptoms improve. If you have continued symptoms or new or worsening symptoms, please let us know and I can see you sooner.  Continue aspirin 81 mg daily.  The physicians and staff at [facility] are committed to providing excellent care. You may receive a survey requesting feedback about your experience at our office. We strive to receive "very good" responses to the survey questions. If you feel that your experience would prevent you from giving the office a "very good " response, please contact our office to try to remedy the situation. We may be reached at [phone]. Thank you for taking the time out of your busy day to complete the survey.  Jacquelyne Balint, MD [facility]

## 2022-11-10 LAB — TSH: TSH: 1.75 u[IU]/mL (ref 0.450–4.500)

## 2022-11-10 LAB — VITAMIN B12: Vitamin B-12: 277 pg/mL (ref 232–1245)

## 2022-11-10 LAB — LIPID PANEL
Chol/HDL Ratio: 4.3 ratio (ref 0.0–4.4)
Cholesterol, Total: 164 mg/dL (ref 100–199)
HDL: 38 mg/dL — ABNORMAL LOW (ref >39)
LDL Chol Calc (NIH): 97 mg/dL (ref 0–99)
Triglycerides: 168 mg/dL — ABNORMAL HIGH (ref 0–149)
VLDL Cholesterol Cal: 29 mg/dL (ref 5–40)

## 2022-11-10 LAB — HEMOGLOBIN A1C
Est. average glucose Bld gHb Est-mCnc: 283 mg/dL
Hgb A1c MFr Bld: 11.5 % — ABNORMAL HIGH (ref 4.8–5.6)

## 2023-03-03 LAB — HM PAP SMEAR: HM Pap smear: NORMAL

## 2023-04-06 ENCOUNTER — Ambulatory Visit: Payer: Self-pay

## 2023-04-06 DIAGNOSIS — Z23 Encounter for immunization: Secondary | ICD-10-CM | POA: Diagnosis not present

## 2023-04-06 DIAGNOSIS — Z719 Counseling, unspecified: Secondary | ICD-10-CM

## 2023-04-06 NOTE — Progress Notes (Signed)
Patient seen in nurse clinic for pneumococcal vaccine. Patient has risk factor- Diabetes. Prevnar 20 was vaccine determined to be needed.  Prevnar 20 IM right deltoid,  Tolerated well. VIS provided. [facility] updated and copy provided. Patient to discuss with MD regarding Pneumo Poly 23 vaccine.

## 2023-04-09 ENCOUNTER — Encounter: Payer: Self-pay | Admitting: Family Medicine

## 2023-04-09 ENCOUNTER — Ambulatory Visit: Payer: Managed Care, Other (non HMO) | Admitting: Family Medicine

## 2023-04-09 VITALS — BP 134/68 | HR 103 | Ht 71.0 in | Wt 277.1 lb

## 2023-04-09 DIAGNOSIS — Z114 Encounter for screening for human immunodeficiency virus [HIV]: Secondary | ICD-10-CM

## 2023-04-09 DIAGNOSIS — N951 Menopausal and female climacteric states: Secondary | ICD-10-CM | POA: Diagnosis not present

## 2023-04-09 DIAGNOSIS — E65 Localized adiposity: Secondary | ICD-10-CM | POA: Diagnosis not present

## 2023-04-09 DIAGNOSIS — E1100 Type 2 diabetes mellitus with hyperosmolarity without nonketotic hyperglycemic-hyperosmolar coma (NKHHC): Secondary | ICD-10-CM

## 2023-04-09 DIAGNOSIS — I10 Essential (primary) hypertension: Secondary | ICD-10-CM

## 2023-04-09 DIAGNOSIS — E038 Other specified hypothyroidism: Secondary | ICD-10-CM

## 2023-04-09 DIAGNOSIS — E559 Vitamin D deficiency, unspecified: Secondary | ICD-10-CM

## 2023-04-09 DIAGNOSIS — Z794 Long term (current) use of insulin: Secondary | ICD-10-CM

## 2023-04-09 DIAGNOSIS — E7849 Other hyperlipidemia: Secondary | ICD-10-CM

## 2023-04-09 DIAGNOSIS — Z1159 Encounter for screening for other viral diseases: Secondary | ICD-10-CM

## 2023-04-09 MED ORDER — BLOOD GLUCOSE MONITORING SUPPL DEVI
1.0000 | Freq: Three times a day (TID) | 0 refills | Status: AC
Start: 2023-04-09 — End: ?

## 2023-04-09 MED ORDER — BLOOD GLUCOSE TEST VI STRP
1.0000 | ORAL_STRIP | Freq: Three times a day (TID) | 0 refills | Status: AC
Start: 2023-04-09 — End: 2023-05-12

## 2023-04-09 MED ORDER — HYDROCHLOROTHIAZIDE 25 MG PO TABS: 25.0000 mg | ORAL_TABLET | Freq: Every day | ORAL | 1 refills | Status: DC | PRN

## 2023-04-09 MED ORDER — LANCETS MISC. MISC
1.0000 | Freq: Three times a day (TID) | 0 refills | Status: AC
Start: 2023-04-09 — End: 2023-05-09

## 2023-04-09 MED ORDER — LANCET DEVICE MISC
1.0000 | Freq: Three times a day (TID) | 0 refills | Status: AC
Start: 2023-04-09 — End: 2023-05-09

## 2023-04-09 MED ORDER — AMLODIPINE BESYLATE 10 MG PO TABS
10.0000 mg | ORAL_TABLET | Freq: Every day | ORAL | 1 refills | Status: DC
Start: 2023-04-09 — End: 2023-06-22

## 2023-04-09 MED ORDER — SEMGLEE (YFGN) 100 UNIT/ML ~~LOC~~ SOPN
50.0000 [IU] | PEN_INJECTOR | Freq: Two times a day (BID) | SUBCUTANEOUS | 2 refills | Status: DC
Start: 2023-04-09 — End: 2023-06-22

## 2023-04-09 MED ORDER — ROSUVASTATIN CALCIUM 10 MG PO TABS
10.0000 mg | ORAL_TABLET | Freq: Every day | ORAL | 3 refills | Status: DC
Start: 2023-04-09 — End: 2023-06-22

## 2023-04-09 NOTE — Patient Instructions (Addendum)
I appreciate the opportunity to provide care to you today!    Follow up:  3 months  Labs: please stop by the lab during the week  to get your blood drawn (CBC, CMP, TSH, Lipid profile, HgA1c, Vit D)  Screening: HIV and Hep C, cortisol levels  I recommend checking your blood sugar at least twice daily Please notify your healthcare provider if your blood sugar is less than 70 or greater than 300 on 2 separate occasions Goal blood sugars:  Fasting blood sugars: 80-130 Before lunch and dinner: less than 140 2 hours after meals: less than 180   For managing diabetes, I recommend the following lifestyle changes:  Reduce Intake of High-Sugar Foods and Beverages: Limit foods and drinks high in sugar to help regulate blood sugar levels. Increase Consumption of Nutrient-Rich Foods: Focus on incorporating more fruits, vegetables, and whole grains into your diet. Choose Lean Proteins: Opt for lean proteins such as chicken, fish, beans, and legumes. Select Low-Fat Dairy Products: Choose low-fat or non-fat dairy options. Minimize Saturated Fats, Trans Fats, and Cholesterol: Reduce intake of foods high in saturated fats, trans fatty acids, and cholesterol. Engage in Regular Physical Activity: Aim for at least 30 minutes of brisk walking or other moderate activity at least 5 days a week.    Here are some foods to avoid or reduce in your diet to help manage cholesterol levels:  Fried Foods:Deep-fried items such as french fries, fried chicken, and fried snacks are high in unhealthy fats and can raise LDL (bad) cholesterol levels. Processed Meats:Foods like bacon, sausage, hot dogs, and deli meats are often high in saturated fat and cholesterol. Full-Fat Dairy Products:Whole milk, full-fat yogurt, butter, cream, and cheese are rich in saturated fats, which can increase cholesterol levels. Baked Goods and Sweets:Pastries, cakes, cookies, and donuts often contain trans fats and added sugars, which can  raise LDL cholesterol and lower HDL (good) cholesterol. Red Meat:Beef, lamb, and pork are high in saturated fat. Lean cuts or plant-based protein alternatives are better options. Lard and Shortening:Used in some baked goods, lard and shortening are high in trans fats and should be avoided. Fast Food:Many fast food items are cooked with unhealthy oils and contain high amounts of saturated and trans fats. Processed Snacks:Chips, crackers, and certain microwave popcorns can contain trans fats and high levels of unhealthy oils. Shellfish:While nutritious in other ways, some shellfish like shrimp, lobster, and crab are high in cholesterol. They should be consumed in moderation. Coconut and Palm Oils:these oils are high in saturated fat and can raise cholesterol levels when used in cooking or baking.     Referrals today-  endocrinology   Attached with your AVS, you will find valuable resources for self-education. I highly recommend dedicating some time to thoroughly examine them.   Please continue to a heart-healthy diet and increase your physical activities. Try to exercise for at least five days a week.    It was a pleasure to see you and I look forward to continuing to work together on your health and well-being. Please do not hesitate to call the office if you need care or have questions about your care.  In case of emergency, please visit the Emergency Department for urgent care, or contact our clinic at [phone] to schedule an appointment. We're here to help you!   Have a wonderful day and week. With Gratitude, Gilmore Laroche MSN, FNP-BC

## 2023-04-09 NOTE — Assessment & Plan Note (Signed)
Pending cortisol levels Recommendations for Weight Loss Management:  Emphasize Lifestyle Changes: A heart-healthy diet and increased physical activity are crucial. Healthy Tips for Weight Loss: Increase Intake of Nutrient-Rich Foods: Prioritize fruits, vegetables, and whole grains. Incorporate Lean Proteins: Include chicken, fish, beans, and legumes in your diet. Choose Low-Fat Dairy Products: Opt for dairy products that are low in fat. Reduce Unhealthy Fats: Limit saturated fats, trans fatty acids, and cholesterol. Aim for Regular Physical Activity: Engage in at least 30 minutes of brisk walking or other physical activities on at least 5 days a week.

## 2023-04-09 NOTE — Assessment & Plan Note (Addendum)
Will consider adding Trulicity or Mounjaro to her treatment regimen to better control her type 2 diabetes Recommended reducing her intake of high sugar foods and beverages with increase physical activity Encouraged to continue taking semglee 50 units BID and metformin 1000 mg twice daily  I recommend checking your blood sugar at least twice daily Please notify your healthcare provider if your blood sugar is less than 70 or greater than 300 on 2 separate occasions Goal blood sugars:  Fasting blood sugars: 80-130 Before lunch and dinner: less than 140 2 hours after meals: less than 180  Lab Results  Component Value Date   HGBA1C 11.5 (H) 11/07/2022

## 2023-04-09 NOTE — Progress Notes (Signed)
New Patient Office Visit  Subjective:  Patient ID: Kathryn Morris, female    DOB: August 08, 1975  Age: 47 y.o. MRN: [id number]  CC:  Chief Complaint  Patient presents with   New Patient (Initial Visit)    Establishing care. Pt wanting to discuss menopause sx. Also has concerns about diabetes, states insulin not helping. Pt needs a referral to endocrinology. Has questions about vitamin k with vitamin d.    HPI JATAVIA Morris is a 47 y.o. female with past medical history of hypertension, type 2 diabetes, and premenopausal symptoms of hot flashes and night sweats presents for establishing care.  Type 2 diabetes: She is on semglee 50 units BID and metformin 1000 mg twice daily.  She reports symptoms of polyuria, polyphagia, by dyspnea.  She notes checking her blood sugar once daily.  Hypertension: She takes hydrochlorothiazide 25 mg daily, metoprolol 50 mg daily, losartan 100 mg daily, amlodipine 10 milligrams daily.  She reports treatment compliance and denies symptoms of headaches, dizziness, blurred vision.  Perimenopausal vasomotor symptoms: She notes having premenopausal vasomotor symptoms of hot flashes and night sweats noting to have irregular cycles.  Central obesity: The patient would like to be tested for Cushing's syndrome as she reports central obesity and  history of hypertension and type 2 diabetes.  Past Medical History:  Diagnosis Date   Anemia    Diabetes mellitus without complication (HCC)    Hypertension    SVT (supraventricular tachycardia)     Past Surgical History:  Procedure Laterality Date   ELECTROPHYSIOLOGIC STUDY N/A 01/01/2016   Procedure: SVT Ablation;  Surgeon: [name], MD;  Location: [facility] INVASIVE CV LAB;  Service: Cardiovascular;  Laterality: N/A;   NO PAST SURGERIES      Family History  Problem Relation Age of Onset   Diabetes Mother    Heart failure Mother    Diabetes Sister     Social History   Socioeconomic History   Marital status:  Single    Spouse name: Not on file   Number of children: Not on file   Years of education: Not on file   Highest education level: Not on file  Occupational History   Not on file  Tobacco Use   Smoking status: Never   Smokeless tobacco: Never  Vaping Use   Vaping status: Never Used  Substance and Sexual Activity   Alcohol use: No   Drug use: No   Sexual activity: Yes  Other Topics Concern   Not on file  Social History Narrative   Are you right handed or left handed? Right   Are you currently employed ?    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    What type of home do you live in: 1 story or 2 story? one    Caffeine 1cup daily    Social Determinants of Health   Financial Resource Strain: Not on file  Food Insecurity: Not on file  Transportation Needs: Not on file  Physical Activity: Not on file  Stress: Not on file  Social Connections: Not on file  Intimate Partner Violence: Not on file    ROS Review of Systems  Constitutional:  Negative for chills and fever.  Eyes:  Negative for visual disturbance.  Respiratory:  Negative for chest tightness and shortness of breath.   Endocrine: Positive for polydipsia, polyphagia and polyuria.  Neurological:  Negative for dizziness and headaches.  Objective:   Today's Vitals: BP 134/68 (BP Location: Left Arm)   Pulse (!) 103   Ht 5\' 11"  (1.803 m)   Wt 277 lb 1.3 oz (125.7 kg)   LMP 04/06/2023   SpO2 97%   BMI 38.64 kg/m   Physical Exam Constitutional:      Appearance: She is obese.  HENT:     Head: Normocephalic.     Mouth/Throat:     Mouth: Mucous membranes are moist.  Cardiovascular:     Rate and Rhythm: Normal rate.     Heart sounds: Normal heart sounds.  Pulmonary:     Effort: Pulmonary effort is normal.     Breath sounds: Normal breath sounds.  Neurological:     Mental Status: She is alert.      Assessment & Plan:   Type 2 diabetes  mellitus with hyperosmolarity without coma, with long-term current use of insulin (HCC) Assessment & Plan: Will consider adding Trulicity or Mounjaro to her treatment regimen to better control her type 2 diabetes Recommended reducing her intake of high sugar foods and beverages with increase physical activity Encouraged to continue taking semglee 50 units BID and metformin 1000 mg twice daily  I recommend checking your blood sugar at least twice daily Please notify your healthcare provider if your blood sugar is less than 70 or greater than 300 on 2 separate occasions Goal blood sugars:  Fasting blood sugars: 80-130 Before lunch and dinner: less than 140 2 hours after meals: less than 180  Lab Results  Component Value Date   HGBA1C 11.5 (H) 11/07/2022      Orders: -     Microalbumin / creatinine urine ratio -     Hemoglobin A1c -     Ambulatory referral to Endocrinology -     Semglee (yfgn); Inject 50 Units into the skin in the morning and at bedtime.  Dispense: 40 mL; Refill: 2 -     Blood Glucose Monitoring Suppl; 1 each by Does not apply route in the morning, at noon, and at bedtime. May substitute to any manufacturer covered by patient's insurance.  Dispense: 1 each; Refill: 0 -     Blood Glucose Test; 1 each by In Vitro route in the morning, at noon, and at bedtime. May substitute to any manufacturer covered by patient's insurance.  Dispense: 100 strip; Refill: 0 -     Lancet Device; 1 each by Does not apply route in the morning, at noon, and at bedtime. May substitute to any manufacturer covered by patient's insurance.  Dispense: 1 each; Refill: 0 -     Lancets Misc.; 1 each by Does not apply route in the morning, at noon, and at bedtime. May substitute to any manufacturer covered by patient's insurance.  Dispense: 100 each; Refill: 0  Primary hypertension Assessment & Plan: Controlled Encouraged to continue current treatment regimen Recommended low-sodium diet increase physical  activity BP Readings from Last 3 Encounters:  04/09/23 134/68  11/07/22 136/68  10/16/22 (!) 156/66     Orders: -     amLODIPine Besylate; Take 1 tablet (10 mg total) by mouth daily.  Dispense: 90 tablet; Refill: 1 -     hydroCHLOROthiazide; Take 1 tablet (25 mg total) by mouth daily as needed (fluid).  Dispense: 60 tablet; Refill: 1  Central obesity Assessment & Plan: Pending cortisol levels Recommendations for Weight Loss Management:  Emphasize Lifestyle Changes: A heart-healthy diet and increased physical activity are crucial. Healthy Tips for Weight Loss: Increase  Intake of Nutrient-Rich Foods: Prioritize fruits, vegetables, and whole grains. Incorporate Lean Proteins: Include chicken, fish, beans, and legumes in your diet. Choose Low-Fat Dairy Products: Opt for dairy products that are low in fat. Reduce Unhealthy Fats: Limit saturated fats, trans fatty acids, and cholesterol. Aim for Regular Physical Activity: Engage in at least 30 minutes of brisk walking or other physical activities on at least 5 days a week.   Orders: -     Cortisol  Perimenopausal vasomotor symptoms Assessment & Plan: Encouraged to take otc estroven menopause relief to assist in managing symptoms effectively   Encounter for screening for HIV -     HIV Antibody (routine testing w rflx)  Need for hepatitis C screening test -     Hepatitis C antibody  Vitamin D deficiency -     VITAMIN D 25 Hydroxy (Vit-D Deficiency, Fractures)  Other specified hypothyroidism -     TSH + free T4  Other hyperlipidemia -     Lipid panel -     CMP14+EGFR -     CBC with Differential/Platelet -     Rosuvastatin Calcium; Take 1 tablet (10 mg total) by mouth daily.  Dispense: 90 tablet; Refill: 3  Note: This chart has been completed using Engineer, civil (consulting) software, and while attempts have been made to ensure accuracy, certain words and phrases may not be transcribed as intended.     Follow-up: Return in  about 3 months (around 07/09/2023).   Gilmore Laroche, FNP

## 2023-04-09 NOTE — Assessment & Plan Note (Signed)
Encouraged to take otc estroven menopause relief to assist in managing symptoms effectively

## 2023-04-09 NOTE — Assessment & Plan Note (Signed)
Controlled Encouraged to continue current treatment regimen Recommended low-sodium diet increase physical activity BP Readings from Last 3 Encounters:  04/09/23 134/68  11/07/22 136/68  10/16/22 (!) 156/66

## 2023-04-11 LAB — CBC WITH DIFFERENTIAL/PLATELET
Basophils Absolute: 0 10*3/uL (ref 0.0–0.2)
Basos: 1 %
EOS (ABSOLUTE): 0.3 10*3/uL (ref 0.0–0.4)
Eos: 4 %
Hematocrit: 38.8 % (ref 34.0–46.6)
Hemoglobin: 12.3 g/dL (ref 11.1–15.9)
Immature Grans (Abs): 0 10*3/uL (ref 0.0–0.1)
Immature Granulocytes: 0 %
Lymphocytes Absolute: 2.5 10*3/uL (ref 0.7–3.1)
Lymphs: 35 %
MCH: 29.3 pg (ref 26.6–33.0)
MCHC: 31.7 g/dL (ref 31.5–35.7)
MCV: 92 fL (ref 79–97)
Monocytes Absolute: 0.4 10*3/uL (ref 0.1–0.9)
Monocytes: 5 %
Neutrophils Absolute: 4 10*3/uL (ref 1.4–7.0)
Neutrophils: 55 %
Platelets: 374 10*3/uL (ref 150–450)
RBC: 4.2 x10E6/uL (ref 3.77–5.28)
RDW: 12.9 % (ref 11.7–15.4)
WBC: 7.2 10*3/uL (ref 3.4–10.8)

## 2023-04-11 LAB — LIPID PANEL
Chol/HDL Ratio: 4.3 {ratio} (ref 0.0–4.4)
Cholesterol, Total: 137 mg/dL (ref 100–199)
HDL: 32 mg/dL — ABNORMAL LOW (ref >39)
LDL Chol Calc (NIH): 80 mg/dL (ref 0–99)
Triglycerides: 143 mg/dL (ref 0–149)
VLDL Cholesterol Cal: 25 mg/dL (ref 5–40)

## 2023-04-11 LAB — TSH+FREE T4
Free T4: 1.33 ng/dL (ref 0.82–1.77)
TSH: 1.79 u[IU]/mL (ref 0.450–4.500)

## 2023-04-11 LAB — CMP14+EGFR
ALT: 40 [IU]/L — ABNORMAL HIGH (ref 0–32)
AST: 37 [IU]/L (ref 0–40)
Albumin: 4.2 g/dL (ref 3.9–4.9)
Alkaline Phosphatase: 146 [IU]/L — ABNORMAL HIGH (ref 44–121)
BUN/Creatinine Ratio: 18 (ref 9–23)
BUN: 14 mg/dL (ref 6–24)
Bilirubin Total: 0.4 mg/dL (ref 0.0–1.2)
CO2: 24 mmol/L (ref 20–29)
Calcium: 9.4 mg/dL (ref 8.7–10.2)
Chloride: 100 mmol/L (ref 96–106)
Creatinine, Ser: 0.79 mg/dL (ref 0.57–1.00)
Globulin, Total: 3.6 g/dL (ref 1.5–4.5)
Glucose: 290 mg/dL — ABNORMAL HIGH (ref 70–99)
Potassium: 3.8 mmol/L (ref 3.5–5.2)
Sodium: 139 mmol/L (ref 134–144)
Total Protein: 7.8 g/dL (ref 6.0–8.5)
eGFR: 93 mL/min/{1.73_m2} (ref >59)

## 2023-04-11 LAB — VITAMIN D 25 HYDROXY (VIT D DEFICIENCY, FRACTURES): Vit D, 25-Hydroxy: 55 ng/mL (ref 30.0–100.0)

## 2023-04-11 LAB — HEMOGLOBIN A1C
Est. average glucose Bld gHb Est-mCnc: 298 mg/dL
Hgb A1c MFr Bld: 12 % — ABNORMAL HIGH (ref 4.8–5.6)

## 2023-04-11 LAB — MICROALBUMIN / CREATININE URINE RATIO
Creatinine, Urine: 248.6 mg/dL
Microalb/Creat Ratio: 38 mg/g{creat} — ABNORMAL HIGH (ref 0–29)
Microalbumin, Urine: 93.6 ug/mL

## 2023-04-11 LAB — HEPATITIS C ANTIBODY: Hep C Virus Ab: NONREACTIVE

## 2023-04-11 LAB — HIV ANTIBODY (ROUTINE TESTING W REFLEX): HIV Screen 4th Generation wRfx: NONREACTIVE

## 2023-04-14 ENCOUNTER — Other Ambulatory Visit: Payer: Self-pay | Admitting: Family Medicine

## 2023-04-14 DIAGNOSIS — E1100 Type 2 diabetes mellitus with hyperosmolarity without nonketotic hyperglycemic-hyperosmolar coma (NKHHC): Secondary | ICD-10-CM

## 2023-04-14 MED ORDER — TIRZEPATIDE 2.5 MG/0.5ML ~~LOC~~ SOAJ
2.5000 mg | SUBCUTANEOUS | 0 refills | Status: DC
Start: 2023-04-14 — End: 2023-05-14

## 2023-04-14 NOTE — Progress Notes (Signed)
Your prescription for Mounjaro 2.5 mg weekly has been sent to your pharmacy. Please take this medication in conjunction with metformin 1000 mg twice daily and Semglee. I recommend checking your blood sugar at least 3 times daily while on this treatment regimen and informing me if your blood sugar is less than 70 or greater than 300 on two separate occasions. Your LDL cholesterol is currently not at the goal of 70. To help achieve this target, please take 2 tablets of rosuvastatin 10 mg daily (20 mg total). I also recommend implementing lifestyle changes by reducing your intake of greasy, starchy, and fatty meals while increasing your physical activity.

## 2023-04-24 NOTE — Progress Notes (Deleted)
NEUROLOGY FOLLOW UP OFFICE NOTE  [name] [id number]  Subjective:  Kathryn Morris is a 47 y.o. year old right-handed female with a medical history of poorly controlled HTN and DM, OSA (not on CPAP), SVT s/p ablation, and migraines who we last saw on 11/07/22 for right face, arm, and leg numbness and weakness and vision changes.  To briefly review: Patient had very high BP at work around 10/14/22 and was sent to ED where her BP was 248/112. She had confusion and slurred speech or word finding difficulty. She was treated for hypertensive emergency. The neurologic symptoms resolved after about 10 minutes, about the time of arrival to ED. She had no further neurologic symptoms until 10/16/22. She woke up on 10/16/22 and checked her BP, which was 189/98. She felt numbness on the right side (right arm and leg). Patient presented to ED for symptoms. She also was seeing double. Her SBP in 180s at home and 160s in ED. As her BP improved, her vision changes resolved. CT head and MRI brain without evidence of stroke.   She continues to have numbness in the entire right arm and weakness in the right leg. She has some imbalance, but denies falls. She does endorse tingling in her right hand. She also endorses so shoulder pain and low back pain.   She denies ever having these types of symptoms prior to 10/14/22. She denies any symptoms on the left side of her body. She also mentions a history of migraines with photophobia, phonophobia, nausea, and vomiting last in 06/2022. She has not had similar headaches this year.   She has presented to the ED in similar uncontrolled HTN and DM in the past as well.   Of note, patient was diagnosed with OSA, but has not seen sleep medicine. She states she needs to make the appointment.   She does not smoker. She does not drink EtOH. She denies any drug use.  Most recent Assessment and Plan (11/07/22): Kathryn Morris is a 47 y.o. female who presents for evaluation of right  arm and leg numbness and weakness and vision changes. Vision changes have resolved, but numbness and subjective weakness persists. She has a relevant medical history of poorly controlled HTN and DM, OSA (not on CPAP), SVT s/p ablation, and migraines. Her neurological examination is pertinent for give way weakness in the right arm and leg and diminished sensation in right face, arm, and leg compared to left. Available diagnostic data is significant for normal CT head and MRI brain on 10/16/22. The etiology of patient's symptoms is not clear currently. Stroke was not seen on MRI on day of symptoms. We discussed repeating as symptoms had not resolved as sometimes early stroke can be missed on MRI. Patient decline though. Hypertensive emergency is also possible as the cause of patient's symptoms. I would have expected her symptoms to have improved with improved BP though (as it is today).   PLAN: -Blood work: TSH, HbA1c, Lipid panel, B12 -Discussed repeating MRI brain and getting vessel imaging with CTA, but patient preferred to defer -Continue aspirin 81 mg daily -May add statin pending lipid panel -Stroke precautions discussed  Since their last visit: Labs were significant for HbA1c of 11.5. I recommended she discuss with PCP to help control DM better. Her B12 was borderline low. I recommended B12 1000 mcg daily for this. ***  MEDICATIONS:  Outpatient Encounter Medications as of 05/08/2023  Medication Sig   amLODipine (NORVASC) 10 MG tablet Take  1 tablet (10 mg total) by mouth daily.   aspirin EC 81 MG tablet Take 81 mg by mouth as needed for mild pain (palpitations).   Blood Glucose Monitoring Suppl DEVI 1 each by Does not apply route in the morning, at noon, and at bedtime. May substitute to any manufacturer covered by patient's insurance.   Cholecalciferol (VITAMIN D) 50 MCG (2000 UT) CAPS Take 5,000 Units by mouth daily.   Cyanocobalamin (VITAMIN B 12) 250 MCG LOZG Take by mouth.   ferrous  sulfate 325 (65 FE) MG tablet Take 325-650 mg by mouth 2 (two) times daily. Take 2 tablets by mouth in the AM and 1 tablet in the PM   folic acid (FOLVITE) 400 MCG tablet Take 400 mcg by mouth daily.   Glucose Blood (BLOOD GLUCOSE TEST STRIPS) STRP 1 each by In Vitro route in the morning, at noon, and at bedtime. May substitute to any manufacturer covered by patient's insurance.   hydrochlorothiazide (HYDRODIURIL) 25 MG tablet Take 1 tablet (25 mg total) by mouth daily as needed (fluid).   Lancet Device MISC 1 each by Does not apply route in the morning, at noon, and at bedtime. May substitute to any manufacturer covered by patient's insurance.   Lancets Misc. MISC 1 each by Does not apply route in the morning, at noon, and at bedtime. May substitute to any manufacturer covered by patient's insurance.   losartan (COZAAR) 100 MG tablet TAKE 1 TABLET BY MOUTH ONCE DAILY. **PLEASE SCHEDULE APPT FOR FURTHER REFILLS**   Magnesium 300 MG CAPS 1 capsule with a meal Orally Once a day   metFORMIN (GLUCOPHAGE) 1000 MG tablet Take 1,000 mg by mouth 2 (two) times daily with a meal.   metoprolol succinate (TOPROL-XL) 50 MG 24 hr tablet TAKE 1 TABLET BY MOUTH ONCE DAILY   Multiple Vitamin (MULTIVITAMIN WITH MINERALS) TABS tablet Take 1 tablet by mouth daily.   rosuvastatin (CRESTOR) 10 MG tablet Take 1 tablet (10 mg total) by mouth daily.   SEMGLEE, YFGN, 100 UNIT/ML Pen Inject 50 Units into the skin in the morning and at bedtime.   tirzepatide [facility]) 2.5 MG/0.5ML Pen Inject 2.5 mg into the skin once a week.   No facility-administered encounter medications on file as of 05/08/2023.    PAST MEDICAL HISTORY: Past Medical History:  Diagnosis Date   Anemia    Diabetes mellitus without complication (HCC)    Hypertension    SVT (supraventricular tachycardia)     PAST SURGICAL HISTORY: Past Surgical History:  Procedure Laterality Date   ELECTROPHYSIOLOGIC STUDY N/A 01/01/2016   Procedure: SVT Ablation;   Surgeon: [name], MD;  Location: [facility] INVASIVE CV LAB;  Service: Cardiovascular;  Laterality: N/A;   NO PAST SURGERIES      ALLERGIES: Allergies  Allergen Reactions   Lisinopril Hives    FAMILY HISTORY: Family History  Problem Relation Age of Onset   Diabetes Mother    Heart failure Mother    Diabetes Sister     SOCIAL HISTORY: Social History   Tobacco Use   Smoking status: Never   Smokeless tobacco: Never  Vaping Use   Vaping status: Never Used  Substance Use Topics   Alcohol use: No   Drug use: No   Social History   Social History Narrative   Are you right handed or left handed? Right   Are you currently employed ?    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    What type of home do you live in: 1 story or 2 story? one    Caffeine 1cup daily       Objective:  Vital Signs:  There were no vitals taken for this visit.  ***  Labs and Imaging review: New results: 11/07/22: HbA1c: 11.5 B12: 277 TSH wnl  04/09/23: Vit D wnl HbA1c: 12.0 Lipid panel:  Component     Latest Ref Rng 04/09/2023  Cholesterol, Total     100 - 199 mg/dL 161   Triglycerides     0 - 149 mg/dL 096   HDL Cholesterol     >39 mg/dL 32 (L)   VLDL Cholesterol Cal     5 - 40 mg/dL 25   LDL Chol Calc (NIH)     0 - 99 mg/dL 80   Total CHOL/HDL Ratio     0.0 - 4.4 ratio 4.3      Previously reviewed results: Lab Results  Component Value Date    TSH 2.232 11/25/2015      Recent Labs[] Expand by Default       Lab Results  Component Value Date    ESRSEDRATE 74 (H) 09/22/2021      10/16/22: CMP significant for glucose of 218 CBC unremarkable   External labs: HbA1c (03/19/18): 10.0 Lipid panel (03/19/18): TG 154, Cholesterol 157, LDL 84, HDL 42   Imaging: MRI brain wo contrast (10/16/22): FINDINGS: Brain: There is no acute intracranial hemorrhage, extra-axial fluid collection, or acute infarct.   Parenchymal  volume is normal. The ventricles are normal in size. Parenchymal signal is normal.   The pituitary and suprasellar region are normal. There is no mass lesion there is no mass effect or midline shift.   Vascular: Normal flow voids.   Skull and upper cervical spine: Normal marrow signal.   Sinuses/Orbits: There is a retention cyst in the left maxillary sinus. The globes and orbits are unremarkable.   Other: None.   IMPRESSION: Unremarkable brain MRI with no acute intracranial pathology.   CT head wo contrast (10/16/22): FINDINGS: Brain:   No age advanced or lobar predominant parenchymal atrophy.   There is no acute intracranial hemorrhage.   No demarcated cortical infarct.   No extra-axial fluid collection.   No evidence of an intracranial mass.   No midline shift.   Vascular: No hyperdense vessel. Atherosclerotic calcifications.   Skull: No fracture or aggressive osseous lesion.   Sinuses/Orbits: No mass or acute finding within the imaged orbits. No significant paranasal sinus disease at the imaged levels at the imaged levels.   IMPRESSION: No evidence of an acute intracranial abnormality.   MRI cervical spine w/wo contrast (09/22/21): FINDINGS: Alignment: Straightening of the normal cervical lordosis. No listhesis.   Vertebrae: Vertebral body height maintained without acute or chronic fracture. Bone marrow signal intensity within normal limits. No discrete or worrisome osseous lesions or abnormal marrow edema.   Cord: Normal signal and morphology.   Posterior Fossa, vertebral arteries, paraspinal tissues: Craniocervical junction normal. Paraspinous soft tissues within normal limits. Normal flow voids seen within the vertebral arteries bilaterally.   Disc levels:   C2-C3: Unremarkable.   C3-C4: Small central disc protrusion minimally indents the ventral thecal sac (series 13, image 34). No significant spinal stenosis. Mild uncovertebral spurring without  significant foraminal encroachment.   C4-C5: Minimal disc bulge with uncovertebral hypertrophy. No spinal stenosis. Foramina remain patent.   C5-C6: Small central disc protrusion indents the ventral  thecal sac (series 13, image 63). No significant spinal stenosis. Mild uncovertebral spurring without significant foraminal encroachment.   C6-C7: Right paracentral disc protrusion indents the ventral thecal sac (series 13, image 77). No significant spinal stenosis or cord deformity. Foramina remain patent.   C7-T1: Negative interspace. Mild right-sided facet hypertrophy. No canal or foraminal stenosis.   IMPRESSION: 1. Mild degenerative spondylosis at C3-4 through C6-7 without significant spinal stenosis or neural impingement. No significant foraminal encroachment within the cervical spine. 2. Normal MRI of the cervical spinal cord. 3. Please note that the peripheral IV infiltrated upon administration of IV contrast. As a result, no postcontrast sequences are available for review.   MRI thoracic spine w/wo contrast (09/22/21): FINDINGS: Alignment: Vertebral bodies normally aligned with preservation of the normal thoracic kyphosis. No listhesis.   Vertebrae: Vertebral body height well maintained without acute or chronic fracture. Bone marrow signal intensity within normal limits. Few scattered benign hemangiomata noted. No worrisome osseous lesions. No abnormal marrow edema.   Cord:  Normal signal and morphology.   Paraspinal and other soft tissues: Unremarkable.   Disc levels:   T4-5: Normal interspace. Right worse than left facet hypertrophy. No spinal stenosis. Mild right foraminal narrowing. Left neural foramina remains patent.   T5-6: Negative interspace. Left-sided facet hypertrophy. No significant canal or foraminal stenosis.   T6-7: Small right paracentral disc protrusion indents the ventral thecal sac (series 23, image 20). Minimal cord flattening without cord  signal changes or significant spinal stenosis.   T7-8: Small central disc protrusion indents the ventral thecal sac (series 23, image 25). No significant spinal stenosis or cord deformity. Foramina remain patent.   T8-9: Small central disc protrusion minimally indents the ventral thecal sac (series 23, image 29). No significant canal or foraminal stenosis.   Otherwise, no other significant disc pathology seen within the thoracic spine for patient age. No other canal or foraminal stenosis or evidence for neural impingement.   IMPRESSION: 1. Normal MRI appearance of the thoracic spinal cord. No findings to explain patient's symptoms. 2. Small disc protrusions at T6-7 through T8-9 without significant spinal stenosis. 3. Right greater than left facet hypertrophy at T4-5 with resultant mild right foraminal stenosis. 4. Please note that the peripheral IV infiltrated upon administration of IV contrast. As a result, no postcontrast imaging is available for review.  Assessment/Plan:  This is Demetrios Isaacs, a 47 y.o. female with: ***   Plan: ***?Statin  Return to clinic in ***  Total time spent reviewing records, interview, history/exam, documentation, and coordination of care on day of encounter:  *** min  Jacquelyne Balint, MD

## 2023-05-08 ENCOUNTER — Ambulatory Visit: Payer: Managed Care, Other (non HMO) | Admitting: Neurology

## 2023-05-14 ENCOUNTER — Telehealth: Payer: Self-pay | Admitting: Family Medicine

## 2023-05-14 ENCOUNTER — Other Ambulatory Visit: Payer: Self-pay

## 2023-05-14 DIAGNOSIS — Z794 Long term (current) use of insulin: Secondary | ICD-10-CM

## 2023-05-14 MED ORDER — TIRZEPATIDE 2.5 MG/0.5ML ~~LOC~~ SOAJ
2.5000 mg | SUBCUTANEOUS | 0 refills | Status: DC
Start: 2023-05-14 — End: 2023-06-04

## 2023-05-14 NOTE — Telephone Encounter (Signed)
Refill sent.

## 2023-05-14 NOTE — Telephone Encounter (Signed)
Prescription Request  05/14/2023  LOV: 04/09/2023  What is the name of the medication or equipment? tirzepatide [facility]) 2.5 MG/0.5ML Pen [[id number]]    Have you contacted your pharmacy to request a refill? Yes   Which pharmacy would you like this sent to?   [facility] Address: [address] [place], [place], [place] [address] Phone: [phone]    Patient notified that their request is being sent to the clinical staff for review and that they should receive a response within 2 business days.   Please advise at Mobile [phone] (mobile)

## 2023-06-04 ENCOUNTER — Other Ambulatory Visit: Payer: Self-pay | Admitting: Family Medicine

## 2023-06-04 DIAGNOSIS — E1100 Type 2 diabetes mellitus with hyperosmolarity without nonketotic hyperglycemic-hyperosmolar coma (NKHHC): Secondary | ICD-10-CM

## 2023-06-18 NOTE — Telephone Encounter (Signed)
Patient  calling says she has yet to receive her medication. Please advise Thank you

## 2023-06-22 ENCOUNTER — Encounter: Payer: Self-pay | Admitting: Family Medicine

## 2023-06-22 ENCOUNTER — Other Ambulatory Visit: Payer: Self-pay

## 2023-06-22 DIAGNOSIS — E7849 Other hyperlipidemia: Secondary | ICD-10-CM

## 2023-06-22 DIAGNOSIS — I10 Essential (primary) hypertension: Secondary | ICD-10-CM

## 2023-06-22 DIAGNOSIS — E1100 Type 2 diabetes mellitus with hyperosmolarity without nonketotic hyperglycemic-hyperosmolar coma (NKHHC): Secondary | ICD-10-CM

## 2023-06-22 MED ORDER — SEMGLEE (YFGN) 100 UNIT/ML ~~LOC~~ SOPN: 50.0000 [IU] | PEN_INJECTOR | Freq: Two times a day (BID) | SUBCUTANEOUS | 2 refills | Status: DC

## 2023-06-22 MED ORDER — MOUNJARO 2.5 MG/0.5ML ~~LOC~~ SOAJ: 2.5000 mg | SUBCUTANEOUS | 0 refills | Status: DC

## 2023-06-22 MED ORDER — HYDROCHLOROTHIAZIDE 25 MG PO TABS
25.0000 mg | ORAL_TABLET | Freq: Every day | ORAL | 1 refills | Status: DC | PRN
Start: 2023-06-22 — End: 2023-07-21

## 2023-06-22 MED ORDER — ROSUVASTATIN CALCIUM 10 MG PO TABS: 10.0000 mg | ORAL_TABLET | Freq: Every day | ORAL | 3 refills | Status: AC

## 2023-06-22 MED ORDER — FERROUS SULFATE 325 (65 FE) MG PO TABS: 325.0000 mg | ORAL_TABLET | Freq: Two times a day (BID) | ORAL | 2 refills | Status: AC

## 2023-06-22 MED ORDER — AMLODIPINE BESYLATE 10 MG PO TABS
10.0000 mg | ORAL_TABLET | Freq: Every day | ORAL | 1 refills | Status: DC
Start: 2023-06-22 — End: 2023-07-21

## 2023-06-23 ENCOUNTER — Other Ambulatory Visit: Payer: Self-pay

## 2023-06-23 MED ORDER — METFORMIN HCL 1000 MG PO TABS: 1000.0000 mg | ORAL_TABLET | Freq: Two times a day (BID) | ORAL | 2 refills | Status: DC

## 2023-06-23 MED ORDER — LOSARTAN POTASSIUM 100 MG PO TABS: 100.0000 mg | ORAL_TABLET | Freq: Every day | ORAL | 1 refills | Status: DC

## 2023-07-21 ENCOUNTER — Encounter: Payer: Self-pay | Admitting: Family Medicine

## 2023-07-21 ENCOUNTER — Other Ambulatory Visit: Payer: Self-pay

## 2023-07-21 ENCOUNTER — Ambulatory Visit: Payer: Managed Care, Other (non HMO) | Admitting: Family Medicine

## 2023-07-21 DIAGNOSIS — E038 Other specified hypothyroidism: Secondary | ICD-10-CM

## 2023-07-21 DIAGNOSIS — E1165 Type 2 diabetes mellitus with hyperglycemia: Secondary | ICD-10-CM

## 2023-07-21 DIAGNOSIS — E559 Vitamin D deficiency, unspecified: Secondary | ICD-10-CM

## 2023-07-21 DIAGNOSIS — Z794 Long term (current) use of insulin: Secondary | ICD-10-CM

## 2023-07-21 DIAGNOSIS — E1169 Type 2 diabetes mellitus with other specified complication: Secondary | ICD-10-CM

## 2023-07-21 DIAGNOSIS — I10 Essential (primary) hypertension: Secondary | ICD-10-CM | POA: Diagnosis not present

## 2023-07-21 DIAGNOSIS — E1142 Type 2 diabetes mellitus with diabetic polyneuropathy: Secondary | ICD-10-CM | POA: Diagnosis not present

## 2023-07-21 DIAGNOSIS — E1100 Type 2 diabetes mellitus with hyperosmolarity without nonketotic hyperglycemic-hyperosmolar coma (NKHHC): Secondary | ICD-10-CM

## 2023-07-21 DIAGNOSIS — E785 Hyperlipidemia, unspecified: Secondary | ICD-10-CM

## 2023-07-21 MED ORDER — DEXCOM G7 SENSOR MISC: 2 refills | Status: DC

## 2023-07-21 MED ORDER — HYDROCHLOROTHIAZIDE 25 MG PO TABS: 25.0000 mg | ORAL_TABLET | Freq: Every day | ORAL | 1 refills | Status: AC | PRN

## 2023-07-21 MED ORDER — LOSARTAN POTASSIUM 100 MG PO TABS: 100.0000 mg | ORAL_TABLET | Freq: Every day | ORAL | 1 refills | Status: DC

## 2023-07-21 MED ORDER — TIRZEPATIDE 5 MG/0.5ML ~~LOC~~ SOAJ
5.0000 mg | SUBCUTANEOUS | 0 refills | Status: DC
  Filled 2023-07-21: qty 2, 28d supply, fill #0

## 2023-07-21 MED ORDER — SEMGLEE (YFGN) 100 UNIT/ML ~~LOC~~ SOPN: 50.0000 [IU] | PEN_INJECTOR | Freq: Two times a day (BID) | SUBCUTANEOUS | 2 refills | Status: DC

## 2023-07-21 MED ORDER — GABAPENTIN 100 MG PO CAPS: 100.0000 mg | ORAL_CAPSULE | Freq: Every day | ORAL | 1 refills | Status: AC

## 2023-07-21 MED ORDER — AMLODIPINE BESYLATE 10 MG PO TABS: 10.0000 mg | ORAL_TABLET | Freq: Every day | ORAL | 1 refills | Status: AC

## 2023-07-21 MED ORDER — METOPROLOL SUCCINATE ER 50 MG PO TB24: 50.0000 mg | ORAL_TABLET | Freq: Every day | ORAL | 3 refills | Status: DC

## 2023-07-21 NOTE — Progress Notes (Signed)
 Established Patient Office Visit  Subjective:  Patient ID: Kathryn Morris, female    DOB: 1975-09-20  Age: 48 y.o. MRN: [id number]  CC:  Chief Complaint  Patient presents with   Care Management    3 month f/u, has bp concerns, has been out of metoprolol  hasn't been able get this filled, also wants to discuss diabetes regimen.    Leg Pain    Pt reports burning sensation in her right leg, worried about blood clot, or hurting it from exercising.     HPI Kathryn Morris is a 48 y.o. female with past medical history of hypertension, type II diabetes presents for f/u of  chronic medical conditions. For the details of today's visit, please refer to the assessment and plan.         300s was sick   Past Medical History:  Diagnosis Date   Anemia    Diabetes mellitus without complication (HCC)    Hypertension    SVT (supraventricular tachycardia) (HCC)     Past Surgical History:  Procedure Laterality Date   ELECTROPHYSIOLOGIC STUDY N/A 01/01/2016   Procedure: SVT Ablation;  Surgeon: Lynwood Rakers, MD;  Location: [facility] INVASIVE CV LAB;  Service: Cardiovascular;  Laterality: N/A;   NO PAST SURGERIES      Family History  Problem Relation Age of Onset   Diabetes Mother    Heart failure Mother    Diabetes Sister     Social History   Socioeconomic History   Marital status: Single    Spouse name: Not on file   Number of children: Not on file   Years of education: Not on file   Highest education level: Not on file  Occupational History   Not on file  Tobacco Use   Smoking status: Never   Smokeless tobacco: Never  Vaping Use   Vaping status: Never Used  Substance and Sexual Activity   Alcohol use: No   Drug use: No   Sexual activity: Yes  Other Topics Concern   Not on file  Social History Narrative   Are you right handed or left handed? Right   Are you currently employed ?    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    What type of home do you live in: 1 story or 2 story? one    Caffeine 1cup daily    Social Drivers of Corporate Investment Banker Strain: Not on file  Food Insecurity: Not on file  Transportation Needs: Not on file  Physical Activity: Not on file  Stress: Not on file  Social Connections: Not on file  Intimate Partner Violence: Not on file    Outpatient Medications Prior to Visit  Medication Sig Dispense Refill   aspirin EC 81 MG tablet Take 81 mg by mouth as needed for mild pain (palpitations).     Blood Glucose Monitoring Suppl DEVI 1 each by Does not apply route in the morning, at noon, and at bedtime. May substitute to any manufacturer covered by patient's insurance. 1 each 0   Cholecalciferol (VITAMIN D ) 50 MCG (2000 UT) CAPS Take 5,000 Units by mouth daily.     Cyanocobalamin (VITAMIN B 12) 250 MCG LOZG Take by mouth.     ferrous sulfate  325 (65 FE) MG tablet Take 1-2 tablets (325-650 mg total) by mouth 2 (two) times daily. Take 2 tablets by mouth in the AM and  1 tablet in the PM 120 tablet 2   folic acid (FOLVITE) 400 MCG tablet Take 400 mcg by mouth daily.     Magnesium 300 MG CAPS 1 capsule with a meal Orally Once a day     metFORMIN  (GLUCOPHAGE ) 1000 MG tablet Take 1 tablet (1,000 mg total) by mouth 2 (two) times daily with a meal. 60 tablet 2   Multiple Vitamin (MULTIVITAMIN WITH MINERALS) TABS tablet Take 1 tablet by mouth daily.     rosuvastatin  (CRESTOR ) 10 MG tablet Take 1 tablet (10 mg total) by mouth daily. 90 tablet 3   amLODipine  (NORVASC ) 10 MG tablet Take 1 tablet (10 mg total) by mouth daily. 90 tablet 1   hydrochlorothiazide  (HYDRODIURIL ) 25 MG tablet Take 1 tablet (25 mg total) by mouth daily as needed (fluid). 60 tablet 1   losartan  (COZAAR ) 100 MG tablet Take 1 tablet (100 mg total) by mouth daily. 30 tablet 1   SEMGLEE , YFGN, 100 UNIT/ML Pen Inject 50 Units into the skin in the morning and at bedtime. 40 mL 2   tirzepatide  (MOUNJARO ) 2.5 MG/0.5ML  Pen Inject 2.5 mg into the skin once a week. 2 mL 0   metoprolol  succinate (TOPROL -XL) 50 MG 24 hr tablet TAKE 1 TABLET BY MOUTH ONCE DAILY (Patient not taking: Reported on 07/21/2023) 16 tablet 0   No facility-administered medications prior to visit.    Allergies  Allergen Reactions   Lisinopril Hives    ROS Review of Systems  Constitutional:  Negative for chills and fever.  Eyes:  Negative for visual disturbance.  Respiratory:  Negative for chest tightness and shortness of breath.   Endocrine: Positive for polyphagia and polyuria.  Neurological:  Negative for dizziness and headaches.      Objective:    Physical Exam HENT:     Head: Normocephalic.     Mouth/Throat:     Mouth: Mucous membranes are moist.  Cardiovascular:     Rate and Rhythm: Normal rate.     Heart sounds: Normal heart sounds.  Pulmonary:     Effort: Pulmonary effort is normal.     Breath sounds: Normal breath sounds.  Neurological:     Mental Status: She is alert.     BP (!) 148/82 (BP Location: Left Arm)   Pulse 97   Ht 5' 11 (1.803 m)   Wt 284 lb 0.6 oz (128.8 kg)   SpO2 96%   BMI 39.62 kg/m  Wt Readings from Last 3 Encounters:  07/21/23 284 lb 0.6 oz (128.8 kg)  04/09/23 277 lb 1.3 oz (125.7 kg)  11/07/22 288 lb (130.6 kg)    Lab Results  Component Value Date   TSH 1.790 04/09/2023   Lab Results  Component Value Date   WBC 7.2 04/09/2023   HGB 12.3 04/09/2023   HCT 38.8 04/09/2023   MCV 92 04/09/2023   PLT 374 04/09/2023   Lab Results  Component Value Date   NA 139 04/09/2023   K 3.8 04/09/2023   CO2 24 04/09/2023   GLUCOSE 290 (H) 04/09/2023   BUN 14 04/09/2023   CREATININE 0.79 04/09/2023   BILITOT 0.4 04/09/2023   ALKPHOS 146 (H) 04/09/2023   AST 37 04/09/2023   ALT 40 (H) 04/09/2023   PROT 7.8 04/09/2023   ALBUMIN 4.2 04/09/2023   CALCIUM  9.4 04/09/2023   ANIONGAP 9 10/16/2022   EGFR 93 04/09/2023   Lab Results  Component Value Date   CHOL 137 04/09/2023    Lab  Results  Component Value Date   HDL 32 (L) 04/09/2023   Lab Results  Component Value Date   LDLCALC 80 04/09/2023   Lab Results  Component Value Date   TRIG 143 04/09/2023   Lab Results  Component Value Date   CHOLHDL 4.3 04/09/2023   Lab Results  Component Value Date   HGBA1C 12.0 (H) 04/09/2023      Assessment & Plan:  Primary hypertension Assessment & Plan: Controlled encouraged to continue taking metoprolol  50 mg daily, losartan  100 mg daily, hydrochlorothiazide  25 mg daily as needed for fluid retention, amlodipine  10 mg daily I recommend low sodium diet less than 2300 mg daily with increase physical activity moderate intensity 150 minutes weekly BP Readings from Last 3 Encounters:  07/21/23 (!) 148/82  04/09/23 134/68  11/07/22 136/68     Orders: -     amLODIPine  Besylate; Take 1 tablet (10 mg total) by mouth daily.  Dispense: 90 tablet; Refill: 1 -     hydroCHLOROthiazide ; Take 1 tablet (25 mg total) by mouth daily as needed (fluid).  Dispense: 90 tablet; Refill: 1 -     Losartan  Potassium; Take 1 tablet (100 mg total) by mouth daily.  Dispense: 90 tablet; Refill: 1 -     Metoprolol  Succinate ER; Take 1 tablet (50 mg total) by mouth daily. Take with or immediately following a meal.  Dispense: 90 tablet; Refill: 3  Type 2 diabetes mellitus with hyperosmolarity without coma, with long-term current use of insulin  [facility]) Assessment & Plan: The patient reports that she was unable to refill Mounjaro  for the month of December due to the pharmacy not having the medication in stock. She also reports experiencing hyperglycemia, with blood sugar levels reaching the 300s and the lowest in the 100s, noting that she has been sick, which may have contributed to her elevated blood glucose levels. She denies nausea, vomiting, abdominal pain, confusion, or fruity-smelling breath.  The patient is encouraged to continue taking Semglee  BID. A prescription for Mounjaro  5 mg weekly  has been sent to [facility], and she is advised to request monthly refills for proper dose adjustments. A continuous glucose monitoring sensor has also been prescribed.  She was encouraged to reduce her intake of high-sugar foods and beverages and increase physical activity to improve glycemic control.  The patient was strongly advised to seek emergency care if her blood sugar remains consistently above 250 mg/dL, especially if she experiences symptoms such as nausea, vomiting, abdominal pain, fruity-smelling breath, blurred vision, weight loss, or confusion.   Orders: -     Semglee  (yfgn); Inject 50 Units into the skin in the morning and at bedtime.  Dispense: 40 mL; Refill: 2  Type 2 diabetes mellitus with peripheral neuropathy (HCC) Assessment & Plan: She reports an intermittent burning sensation in her right vastus lateralis muscle that subsides on its own. No recent injury or trauma was noted. There is no redness, warmth, swelling, or pain on palpation. There is low suspicion for a blood clot. Therapy with gabapentin  100 mg at bedtime will be initiated. The patient is encouraged to follow up if symptoms worsen or fail to improve.   Orders: -     Gabapentin ; Take 1 capsule (100 mg total) by mouth at bedtime.  Dispense: 30 capsule; Refill: 1  Type 2 diabetes mellitus with hyperglycemia, with long-term current use of insulin  [facility]) Assessment & Plan: The patient reports that she was unable to refill Mounjaro  for the month of December  due to the pharmacy not having the medication in stock. She also reports experiencing hyperglycemia, with blood sugar levels reaching the 300s and the lowest in the 100s, noting that she has been sick, which may have contributed to her elevated blood glucose levels. She denies nausea, vomiting, abdominal pain, confusion, or fruity-smelling breath.  The patient is encouraged to continue taking Semglee  BID. A prescription for Mounjaro  5  mg weekly has been sent to [facility], and she is advised to request monthly refills for proper dose adjustments. A continuous glucose monitoring sensor has also been prescribed.  She was encouraged to reduce her intake of high-sugar foods and beverages and increase physical activity to improve glycemic control.  The patient was strongly advised to seek emergency care if her blood sugar remains consistently above 250 mg/dL, especially if she experiences symptoms such as nausea, vomiting, abdominal pain, fruity-smelling breath, blurred vision, weight loss, or confusion.   Orders: -     Tirzepatide ; Inject 5 mg into the skin once a week.  Dispense: 2 mL; Refill: 0 -     Hemoglobin A1c -     Dexcom G7 Sensor; Follow the instructions on the package for continuous glucose monitoring  Dispense: 1 each; Refill: 2  Vitamin D  deficiency -     VITAMIN D  25 Hydroxy (Vit-D Deficiency, Fractures)  TSH (thyroid -stimulating hormone deficiency) -     TSH + free T4  Hyperlipidemia due to type 2 diabetes mellitus (HCC) -     Lipid panel -     CMP14+EGFR -     CBC with Differential/Platelet  Note: This chart has been completed using Engineer[employer] (consulting) software, and while attempts have been made to ensure accuracy, certain words and phrases may not be transcribed as intended.    Follow-up: Return in about 3 months (around 10/19/2023).   Shivam Mestas, FNP

## 2023-07-21 NOTE — Patient Instructions (Addendum)
 I appreciate the opportunity to provide care to you today!    Follow up:  3 months  Labs: please stop by the lab during the week to get your blood drawn (CBC, CMP, TSH, Lipid profile, HgA1c, Vit D)  Type II diabetes -start taking mounjaro  5 mg weekly -continue taking semglee  50 units b.i.d. and notify me if your blood sugar is consistently below 70 Goal blood sugars:  Fasting blood sugars: 80-130 Before lunch and dinner: less than 140 2 hours after meals: less than 180  continuous glucose monitoring sensor (G7) has been since your pharmacy   Peripheral neuropathy start taking gabapentin  100 milligrams at bedtime  Hypertension continue taking metoprolol  50 mg daily, losartan  100 mg daily, hydrochlorothiazide  25 mg daily as needed for fluid retention, amlodipine  10 mg daily I recommend low sodium diet less than 2300 mg daily with increase physical activity moderate intensity 150 minutes weekly  Attached with your AVS, you will find valuable resources for self-education. I highly recommend dedicating some time to thoroughly examine them.   Please continue to a heart-healthy diet and increase your physical activities. Try to exercise for at least five days a week.    It was a pleasure to see you and I look forward to continuing to work together on your health and well-being. Please do not hesitate to call the office if you need care or have questions about your care.  In case of emergency, please visit the Emergency Department for urgent care, or contact our clinic at [phone] to schedule an appointment. We're here to help you!   Have a wonderful day and week. With Gratitude, Jaleen Finch MSN, FNP-BC

## 2023-07-21 NOTE — Assessment & Plan Note (Signed)
 The patient reports that she was unable to refill Mounjaro  for the month of December due to the pharmacy not having the medication in stock. She also reports experiencing hyperglycemia, with blood sugar levels reaching the 300s and the lowest in the 100s, noting that she has been sick, which may have contributed to her elevated blood glucose levels. She denies nausea, vomiting, abdominal pain, confusion, or fruity-smelling breath.  The patient is encouraged to continue taking Semglee  BID. A prescription for Mounjaro  5 mg weekly has been sent to [facility], and she is advised to request monthly refills for proper dose adjustments. A continuous glucose monitoring sensor has also been prescribed.  She was encouraged to reduce her intake of high-sugar foods and beverages and increase physical activity to improve glycemic control.  The patient was strongly advised to seek emergency care if her blood sugar remains consistently above 250 mg/dL, especially if she experiences symptoms such as nausea, vomiting, abdominal pain, fruity-smelling breath, blurred vision, weight loss, or confusion.

## 2023-07-21 NOTE — Assessment & Plan Note (Addendum)
 Controlled encouraged to continue taking metoprolol  50 mg daily, losartan  100 mg daily, hydrochlorothiazide  25 mg daily as needed for fluid retention, amlodipine  10 mg daily I recommend low sodium diet less than 2300 mg daily with increase physical activity moderate intensity 150 minutes weekly BP Readings from Last 3 Encounters:  07/21/23 (!) 148/82  04/09/23 134/68  11/07/22 136/68

## 2023-07-21 NOTE — Assessment & Plan Note (Addendum)
 She reports an intermittent burning sensation in her right vastus lateralis muscle that subsides on its own. No recent injury or trauma was noted. There is no redness, warmth, swelling, or pain on palpation. There is low suspicion for a blood clot. Therapy with gabapentin  100 mg at bedtime will be initiated. The patient is encouraged to follow up if symptoms worsen or fail to improve.

## 2023-07-23 ENCOUNTER — Ambulatory Visit: Payer: Self-pay | Admitting: Nurse Practitioner

## 2023-08-04 ENCOUNTER — Other Ambulatory Visit: Payer: Self-pay

## 2023-08-07 ENCOUNTER — Other Ambulatory Visit: Payer: Self-pay | Admitting: Family Medicine

## 2023-08-07 DIAGNOSIS — E1165 Type 2 diabetes mellitus with hyperglycemia: Secondary | ICD-10-CM

## 2023-08-07 MED ORDER — DEXCOM G7 SENSOR MISC: 2 refills | Status: DC

## 2023-08-07 NOTE — Telephone Encounter (Signed)
Last Fill: 07/21/23  Last OV: 07/21/23 Next OV: 10/20/23  Routing to provider for review/authorization.

## 2023-08-07 NOTE — Telephone Encounter (Signed)
Copied from CRM [id number]. Topic: Clinical - Medication Refill >> Aug 07, 2023  4:42 PM Antony Haste wrote: Most Recent Primary Care Visit:  Provider: Gilmore Laroche  Department: [facility]  Visit Type: OFFICE VISIT  Date: 07/21/2023  Medication: Continuous Glucose Sensor (DEXCOM G7 SENSOR) MISC  Has the patient contacted their pharmacy? Yes, [facility] is requesting to have this resent (Agent: If no, request that the patient contact the pharmacy for the refill. If patient does not wish to contact the pharmacy document the reason why and proceed with request.) (Agent: If yes, when and what did the pharmacy advise?)  Is this the correct pharmacy for this prescription? Yes If no, delete pharmacy and type the correct one.  This is the patient's preferred pharmacy:   [facility] [address]), [place] - [address] [place]) [place] [address] Phone: [phone] Fax: [phone]   Has the prescription been filled recently? Yes  Is the patient out of the medication? Yes  Has the patient been seen for an appointment in the last year OR does the patient have an upcoming appointment? Yes  Can we respond through MyChart? No  Agent: Please be advised that Rx refills may take up to 3 business days. We ask that you follow-up with your pharmacy.

## 2023-08-12 NOTE — Telephone Encounter (Signed)
Called to inform pt. Lvm w/ information.

## 2023-08-17 NOTE — Progress Notes (Deleted)
 NEUROLOGY FOLLOW UP OFFICE NOTE  AMYJO MIZRACHI [id number]  Subjective:  Kathryn Morris is a 48 y.o. year old right-handed female with a medical history of poorly controlled HTN and DM, OSA (not on CPAP), SVT s/p ablation, and migraines who we last saw on 11/07/22 for right face, arm, and leg numbness and weakness and vision changes.  To briefly review: Patient had very high BP at work around 10/14/22 and was sent to ED where her BP was 248/112. She had confusion and slurred speech or word finding difficulty. She was treated for hypertensive emergency. The neurologic symptoms resolved after about 10 minutes, about the time of arrival to ED. She had no further neurologic symptoms until 10/16/22. She woke up on 10/16/22 and checked her BP, which was 189/98. She felt numbness on the right side (right arm and leg). Patient presented to ED for symptoms. She also was seeing double. Her SBP in 180s at home and 160s in ED. As her BP improved, her vision changes resolved. CT head and MRI brain without evidence of stroke.   She continues to have numbness in the entire right arm and weakness in the right leg. She has some imbalance, but denies falls. She does endorse tingling in her right hand. She also endorses so shoulder pain and low back pain.   She denies ever having these types of symptoms prior to 10/14/22. She denies any symptoms on the left side of her body. She also mentions a history of migraines with photophobia, phonophobia, nausea, and vomiting last in 06/2022. She has not had similar headaches this year.   She has presented to the ED in similar uncontrolled HTN and DM in the past as well.   Of note, patient was diagnosed with OSA, but has not seen sleep medicine. She states she needs to make the appointment.   She does not smoker. She does not drink EtOH. She denies any drug use.  Most recent Assessment and Plan (11/07/22): Kathryn Morris is a 48 y.o. female who presents for evaluation of right  arm and leg numbness and weakness and vision changes. Vision changes have resolved, but numbness and subjective weakness persists. She has a relevant medical history of poorly controlled HTN and DM, OSA (not on CPAP), SVT s/p ablation, and migraines. Her neurological examination is pertinent for give way weakness in the right arm and leg and diminished sensation in right face, arm, and leg compared to left. Available diagnostic data is significant for normal CT head and MRI brain on 10/16/22. The etiology of patient's symptoms is not clear currently. Stroke was not seen on MRI on day of symptoms. We discussed repeating as symptoms had not resolved as sometimes early stroke can be missed on MRI. Patient decline though. Hypertensive emergency is also possible as the cause of patient's symptoms. I would have expected her symptoms to have improved with improved BP though (as it is today).   PLAN: -Blood work: TSH, HbA1c, Lipid panel, B12 -Discussed repeating MRI brain and getting vessel imaging with CTA, but patient preferred to defer -Continue aspirin 81 mg daily -May add statin pending lipid panel -Stroke precautions discussed  Since their last visit: Labs were significant for HbA1c of 11.5. I recommended she discuss with PCP to help control DM better. Her B12 was borderline low. I recommended B12 1000 mcg daily for this. ***  MEDICATIONS:  Outpatient Encounter Medications as of 08/27/2023  Medication Sig   amLODipine (NORVASC) 10 MG tablet Take  1 tablet (10 mg total) by mouth daily.   aspirin EC 81 MG tablet Take 81 mg by mouth as needed for mild pain (palpitations).   Blood Glucose Monitoring Suppl DEVI 1 each by Does not apply route in the morning, at noon, and at bedtime. May substitute to any manufacturer covered by patient's insurance.   Cholecalciferol (VITAMIN D) 50 MCG (2000 UT) CAPS Take 5,000 Units by mouth daily.   Continuous Glucose Sensor (DEXCOM G7 SENSOR) MISC Follow the instructions on  the package for continuous glucose monitoring   Cyanocobalamin (VITAMIN B 12) 250 MCG LOZG Take by mouth.   ferrous sulfate 325 (65 FE) MG tablet Take 1-2 tablets (325-650 mg total) by mouth 2 (two) times daily. Take 2 tablets by mouth in the AM and 1 tablet in the PM   folic acid (FOLVITE) 400 MCG tablet Take 400 mcg by mouth daily.   gabapentin (NEURONTIN) 100 MG capsule Take 1 capsule (100 mg total) by mouth at bedtime.   hydrochlorothiazide (HYDRODIURIL) 25 MG tablet Take 1 tablet (25 mg total) by mouth daily as needed (fluid).   losartan (COZAAR) 100 MG tablet Take 1 tablet (100 mg total) by mouth daily.   Magnesium 300 MG CAPS 1 capsule with a meal Orally Once a day   metFORMIN (GLUCOPHAGE) 1000 MG tablet Take 1 tablet (1,000 mg total) by mouth 2 (two) times daily with a meal.   metoprolol succinate (TOPROL-XL) 50 MG 24 hr tablet Take 1 tablet (50 mg total) by mouth daily. Take with or immediately following a meal.   Multiple Vitamin (MULTIVITAMIN WITH MINERALS) TABS tablet Take 1 tablet by mouth daily.   rosuvastatin (CRESTOR) 10 MG tablet Take 1 tablet (10 mg total) by mouth daily.   SEMGLEE, YFGN, 100 UNIT/ML Pen Inject 50 Units into the skin in the morning and at bedtime.   tirzepatide [facility]) 5 MG/0.5ML Pen Inject 5 mg into the skin once a week.   No facility-administered encounter medications on file as of 08/27/2023.    PAST MEDICAL HISTORY: Past Medical History:  Diagnosis Date   Anemia    Diabetes mellitus without complication (HCC)    Hypertension    SVT (supraventricular tachycardia) (HCC)     PAST SURGICAL HISTORY: Past Surgical History:  Procedure Laterality Date   ELECTROPHYSIOLOGIC STUDY N/A 01/01/2016   Procedure: SVT Ablation;  Surgeon: [name], MD;  Location: [facility] INVASIVE CV LAB;  Service: Cardiovascular;  Laterality: N/A;   NO PAST SURGERIES      ALLERGIES: Allergies  Allergen Reactions   Lisinopril Hives    FAMILY HISTORY: Family History   Problem Relation Age of Onset   Diabetes Mother    Heart failure Mother    Diabetes Sister     SOCIAL HISTORY: Social History   Tobacco Use   Smoking status: Never   Smokeless tobacco: Never  Vaping Use   Vaping status: Never Used  Substance Use Topics   Alcohol use: No   Drug use: No   Social History   Social History Narrative   Are you right handed or left handed? Right   Are you currently employed ?    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    What type of home do you live in: 1 story or 2 story? one    Caffeine 1cup daily       Objective:  Vital Signs:  There  were no vitals taken for this visit.  ***  Labs and Imaging review: New results: 11/07/22: HbA1c: 11.5 B12: 277 TSH wnl   04/09/23: Vit D wnl HbA1c: 12.0 Lipid panel:   Component     Latest Ref Rng 04/09/2023  Cholesterol, Total     100 - 199 mg/dL 191   Triglycerides     0 - 149 mg/dL 478   HDL Cholesterol     >39 mg/dL 32 (L)   VLDL Cholesterol Cal     5 - 40 mg/dL 25   LDL Chol Calc (NIH)     0 - 99 mg/dL 80   Total CHOL/HDL Ratio     0.0 - 4.4 ratio 4.3         Previously reviewed results:      Lab Results  Component Value Date    TSH 2.232 11/25/2015      Recent Labs[] Expand by Default           Lab Results  Component Value Date    ESRSEDRATE 74 (H) 09/22/2021      10/16/22: CMP significant for glucose of 218 CBC unremarkable   External labs: HbA1c (03/19/18): 10.0 Lipid panel (03/19/18): TG 154, Cholesterol 157, LDL 84, HDL 42   Imaging: MRI brain wo contrast (10/16/22): FINDINGS: Brain: There is no acute intracranial hemorrhage, extra-axial fluid collection, or acute infarct.   Parenchymal volume is normal. The ventricles are normal in size. Parenchymal signal is normal.   The pituitary and suprasellar region are normal. There is no mass lesion there is no mass effect or midline shift.    Vascular: Normal flow voids.   Skull and upper cervical spine: Normal marrow signal.   Sinuses/Orbits: There is a retention cyst in the left maxillary sinus. The globes and orbits are unremarkable.   Other: None.   IMPRESSION: Unremarkable brain MRI with no acute intracranial pathology.   CT head wo contrast (10/16/22): FINDINGS: Brain:   No age advanced or lobar predominant parenchymal atrophy.   There is no acute intracranial hemorrhage.   No demarcated cortical infarct.   No extra-axial fluid collection.   No evidence of an intracranial mass.   No midline shift.   Vascular: No hyperdense vessel. Atherosclerotic calcifications.   Skull: No fracture or aggressive osseous lesion.   Sinuses/Orbits: No mass or acute finding within the imaged orbits. No significant paranasal sinus disease at the imaged levels at the imaged levels.   IMPRESSION: No evidence of an acute intracranial abnormality.   MRI cervical spine w/wo contrast (09/22/21): FINDINGS: Alignment: Straightening of the normal cervical lordosis. No listhesis.   Vertebrae: Vertebral body height maintained without acute or chronic fracture. Bone marrow signal intensity within normal limits. No discrete or worrisome osseous lesions or abnormal marrow edema.   Cord: Normal signal and morphology.   Posterior Fossa, vertebral arteries, paraspinal tissues: Craniocervical junction normal. Paraspinous soft tissues within normal limits. Normal flow voids seen within the vertebral arteries bilaterally.   Disc levels:   C2-C3: Unremarkable.   C3-C4: Small central disc protrusion minimally indents the ventral thecal sac (series 13, image 34). No significant spinal stenosis. Mild uncovertebral spurring without significant foraminal encroachment.   C4-C5: Minimal disc bulge with uncovertebral hypertrophy. No spinal stenosis. Foramina remain patent.   C5-C6: Small central disc protrusion indents the ventral  thecal sac (series 13, image 63). No significant spinal stenosis. Mild uncovertebral spurring without significant foraminal encroachment.   C6-C7: Right paracentral disc protrusion indents the ventral thecal  sac (series 13, image 77). No significant spinal stenosis or cord deformity. Foramina remain patent.   C7-T1: Negative interspace. Mild right-sided facet hypertrophy. No canal or foraminal stenosis.   IMPRESSION: 1. Mild degenerative spondylosis at C3-4 through C6-7 without significant spinal stenosis or neural impingement. No significant foraminal encroachment within the cervical spine. 2. Normal MRI of the cervical spinal cord. 3. Please note that the peripheral IV infiltrated upon administration of IV contrast. As a result, no postcontrast sequences are available for review.   MRI thoracic spine w/wo contrast (09/22/21): FINDINGS: Alignment: Vertebral bodies normally aligned with preservation of the normal thoracic kyphosis. No listhesis.   Vertebrae: Vertebral body height well maintained without acute or chronic fracture. Bone marrow signal intensity within normal limits. Few scattered benign hemangiomata noted. No worrisome osseous lesions. No abnormal marrow edema.   Cord:  Normal signal and morphology.   Paraspinal and other soft tissues: Unremarkable.   Disc levels:   T4-5: Normal interspace. Right worse than left facet hypertrophy. No spinal stenosis. Mild right foraminal narrowing. Left neural foramina remains patent.   T5-6: Negative interspace. Left-sided facet hypertrophy. No significant canal or foraminal stenosis.   T6-7: Small right paracentral disc protrusion indents the ventral thecal sac (series 23, image 20). Minimal cord flattening without cord signal changes or significant spinal stenosis.   T7-8: Small central disc protrusion indents the ventral thecal sac (series 23, image 25). No significant spinal stenosis or cord deformity. Foramina  remain patent.   T8-9: Small central disc protrusion minimally indents the ventral thecal sac (series 23, image 29). No significant canal or foraminal stenosis.   Otherwise, no other significant disc pathology seen within the thoracic spine for patient age. No other canal or foraminal stenosis or evidence for neural impingement.   IMPRESSION: 1. Normal MRI appearance of the thoracic spinal cord. No findings to explain patient's symptoms. 2. Small disc protrusions at T6-7 through T8-9 without significant spinal stenosis. 3. Right greater than left facet hypertrophy at T4-5 with resultant mild right foraminal stenosis. 4. Please note that the peripheral IV infiltrated upon administration of IV contrast. As a result, no postcontrast imaging is available for review.  Assessment/Plan:  This is Demetrios Isaacs, a 48 y.o. female with: ***   Plan: ***  Return to clinic in ***  Total time spent reviewing records, interview, history/exam, documentation, and coordination of care on day of encounter:  *** min  Jacquelyne Balint, MD

## 2023-08-27 ENCOUNTER — Ambulatory Visit: Payer: Managed Care, Other (non HMO) | Admitting: Neurology

## 2023-09-03 ENCOUNTER — Other Ambulatory Visit: Payer: Self-pay | Admitting: Family Medicine

## 2023-10-20 ENCOUNTER — Ambulatory Visit: Payer: Managed Care, Other (non HMO) | Admitting: Family Medicine

## 2023-11-04 NOTE — Patient Instructions (Signed)

## 2023-11-05 ENCOUNTER — Encounter: Payer: Self-pay | Admitting: Nurse Practitioner

## 2023-11-05 ENCOUNTER — Ambulatory Visit: Payer: Self-pay | Admitting: Nurse Practitioner

## 2023-11-05 ENCOUNTER — Telehealth: Payer: Self-pay

## 2023-11-05 VITALS — BP 136/74 | HR 68 | Ht 71.0 in | Wt 285.0 lb

## 2023-11-05 DIAGNOSIS — Z7985 Long-term (current) use of injectable non-insulin antidiabetic drugs: Secondary | ICD-10-CM | POA: Diagnosis not present

## 2023-11-05 DIAGNOSIS — E1165 Type 2 diabetes mellitus with hyperglycemia: Secondary | ICD-10-CM

## 2023-11-05 DIAGNOSIS — Z7984 Long term (current) use of oral hypoglycemic drugs: Secondary | ICD-10-CM | POA: Diagnosis not present

## 2023-11-05 DIAGNOSIS — Z794 Long term (current) use of insulin: Secondary | ICD-10-CM | POA: Diagnosis not present

## 2023-11-05 DIAGNOSIS — E1100 Type 2 diabetes mellitus with hyperosmolarity without nonketotic hyperglycemic-hyperosmolar coma (NKHHC): Secondary | ICD-10-CM

## 2023-11-05 DIAGNOSIS — E782 Mixed hyperlipidemia: Secondary | ICD-10-CM

## 2023-11-05 DIAGNOSIS — I10 Essential (primary) hypertension: Secondary | ICD-10-CM

## 2023-11-05 LAB — POCT GLYCOSYLATED HEMOGLOBIN (HGB A1C): HbA1c, POC (controlled diabetic range): 11.3 % — AB (ref 0.0–7.0)

## 2023-11-05 MED ORDER — TIRZEPATIDE 5 MG/0.5ML ~~LOC~~ SOAJ: 5.0000 mg | SUBCUTANEOUS | 1 refills | Status: AC

## 2023-11-05 MED ORDER — DEXCOM G7 SENSOR MISC: 3 refills | Status: AC

## 2023-11-05 MED ORDER — TIRZEPATIDE 2.5 MG/0.5ML ~~LOC~~ SOAJ
2.5000 mg | SUBCUTANEOUS | 0 refills | Status: DC
Start: 2023-11-05 — End: 2024-01-07

## 2023-11-05 MED ORDER — METFORMIN HCL 1000 MG PO TABS: 1000.0000 mg | ORAL_TABLET | Freq: Two times a day (BID) | ORAL | 3 refills | Status: AC

## 2023-11-05 MED ORDER — SEMGLEE (YFGN) 100 UNIT/ML ~~LOC~~ SOPN: 50.0000 [IU] | PEN_INJECTOR | Freq: Every day | SUBCUTANEOUS | 2 refills | Status: DC

## 2023-11-05 NOTE — Progress Notes (Addendum)
 Endocrinology Consult Note       11/05/2023, 12:07 PM   Subjective:    Patient ID: Kathryn Morris, female    DOB: [birth year].  Kathryn Morris is being seen in consultation for management of currently uncontrolled symptomatic diabetes requested by  Zarwolo, Gloria, FNP.   Past Medical History:  Diagnosis Date   Anemia    Diabetes mellitus without complication (HCC)    Hypertension    SVT (supraventricular tachycardia) (HCC)     Past Surgical History:  Procedure Laterality Date   ELECTROPHYSIOLOGIC STUDY N/A 01/01/2016   Procedure: SVT Ablation;  Surgeon: Jolly Needle, MD;  Location: [facility] INVASIVE CV LAB;  Service: Cardiovascular;  Laterality: N/A;   NO PAST SURGERIES      Social History   Socioeconomic History   Marital status: Single    Spouse name: Not on file   Number of children: Not on file   Years of education: Not on file   Highest education level: Not on file  Occupational History   Not on file  Tobacco Use   Smoking status: Never   Smokeless tobacco: Never  Vaping Use   Vaping status: Never Used  Substance and Sexual Activity   Alcohol use: No   Drug use: No   Sexual activity: Yes  Other Topics Concern   Not on file  Social History Narrative   Are you right handed or left handed? Right   Are you currently employed ?    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    What type of home do you live in: 1 story or 2 story? one    Caffeine 1cup daily    Social Drivers of Corporate investment banker Strain: Not on file  Food Insecurity: Not on file  Transportation Needs: Not on file  Physical Activity: Not on file  Stress: Not on file  Social Connections: Not on file    Family History  Problem Relation Age of Onset   Diabetes Mother    Heart failure Mother    Diabetes Sister     Outpatient Encounter Medications as of  11/05/2023  Medication Sig   tirzepatide  (MOUNJARO ) 2.5 MG/0.5ML Pen Inject 2.5 mg into the skin once a week.   [START ON 12/03/2023] tirzepatide  (MOUNJARO ) 5 MG/0.5ML Pen Inject 5 mg into the skin once a week.   amLODipine  (NORVASC ) 10 MG tablet Take 1 tablet (10 mg total) by mouth daily.   aspirin EC 81 MG tablet Take 81 mg by mouth as needed for mild pain (palpitations).   Blood Glucose Monitoring Suppl DEVI 1 each by Does not apply route in the morning, at noon, and at bedtime. May substitute to any manufacturer covered by patient's insurance.   Cholecalciferol (VITAMIN D ) 50 MCG (2000 UT) CAPS Take 5,000 Units by mouth daily.   Continuous Glucose Sensor (DEXCOM G7 SENSOR) MISC Follow the instructions on the package for continuous glucose monitoring   Cyanocobalamin (VITAMIN B 12) 250 MCG LOZG Take by mouth.   ferrous sulfate  325 (65 FE) MG tablet Take 1-2 tablets (325-650 mg total) by  mouth 2 (two) times daily. Take 2 tablets by mouth in the AM and 1 tablet in the PM   folic acid (FOLVITE) 400 MCG tablet Take 400 mcg by mouth daily. (Patient not taking: Reported on 11/05/2023)   gabapentin  (NEURONTIN ) 100 MG capsule Take 1 capsule (100 mg total) by mouth at bedtime. (Patient not taking: Reported on 11/05/2023)   hydrochlorothiazide  (HYDRODIURIL ) 25 MG tablet Take 1 tablet (25 mg total) by mouth daily as needed (fluid).   losartan  (COZAAR ) 100 MG tablet Take 1 tablet (100 mg total) by mouth daily.   Magnesium 300 MG CAPS 1 capsule with a meal Orally Once a day   metFORMIN  (GLUCOPHAGE ) 1000 MG tablet Take 1 tablet (1,000 mg total) by mouth 2 (two) times daily with a meal.   metoprolol  succinate (TOPROL -XL) 50 MG 24 hr tablet Take 1 tablet (50 mg total) by mouth daily. Take with or immediately following a meal.   Multiple Vitamin (MULTIVITAMIN WITH MINERALS) TABS tablet Take 1 tablet by mouth daily.   rosuvastatin  (CRESTOR ) 10 MG tablet Take 1 tablet (10 mg total) by mouth daily.   SEMGLEE , YFGN,  100 UNIT/ML Pen Inject 50 Units into the skin at bedtime.   [DISCONTINUED] Continuous Glucose Sensor (DEXCOM G7 SENSOR) MISC Follow the instructions on the package for continuous glucose monitoring   [DISCONTINUED] metFORMIN  (GLUCOPHAGE ) 1000 MG tablet TAKE 1 TABLET BY MOUTH TWICE DAILY WITH A MEAL   [DISCONTINUED] SEMGLEE , YFGN, 100 UNIT/ML Pen Inject 50 Units into the skin in the morning and at bedtime.   [DISCONTINUED] tirzepatide  (MOUNJARO ) 5 MG/0.5ML Pen Inject 5 mg into the skin once a week.   No facility-administered encounter medications on file as of 11/05/2023.    ALLERGIES: Allergies  Allergen Reactions   Lisinopril Hives    VACCINATION STATUS: Immunization History  Administered Date(s) Administered   Influenza,inj,Quad PF,6+ Mos 04/08/2017, 04/07/2018, 04/13/2019   Influenza-Unspecified 04/20/2023   PNEUMOCOCCAL CONJUGATE-20 04/06/2023   Pneumococcal Polysaccharide-23 07/29/2017   Tdap 03/19/2018    Diabetes She presents for her initial diabetic visit. She has type 2 diabetes mellitus. Onset time: diagnosed at approx age of 37. Her disease course has been improving. Hypoglycemia symptoms include nervousness/anxiousness, sweats and tremors. Associated symptoms include fatigue and polydipsia. Pertinent negatives for diabetes include no weight loss. There are no hypoglycemic complications. Symptoms are stable. There are no diabetic complications. Risk factors for coronary artery disease include diabetes mellitus, obesity, hypertension, dyslipidemia and sedentary lifestyle. Current diabetic treatment includes insulin  injections and oral agent (monotherapy) (and Mounjaro  for a short period of time). Her weight is fluctuating minimally. She is following a generally unhealthy diet. When asked about meal planning, she reported none. She has not had a previous visit with a dietitian. She participates in exercise three times a week. (She presents today for her consultation with no meter or  readings to review.  She has a CGM at home but recently upgraded phones and has not yet set it up on her new phone.  Her POCT A1c today is 11.3%, improving from last visit of 12%.  She drinks coffee with creamer, tea with splenda, water, and 1 diet soda per day.  She eats 2 meals per day and snacks between.  She does engage in routine physical activity.  She is due for eye exam now, has never seen podiatry in the past.  ) An ACE inhibitor/angiotensin II receptor blocker is being taken. She does not see a podiatrist.Eye exam is current.  Review of systems  Constitutional: + Minimally fluctuating body weight, current Body mass index is 39.75 kg/m., no fatigue, no subjective hyperthermia, no subjective hypothermia Eyes: no blurry vision, no xerophthalmia ENT: no sore throat, no nodules palpated in throat, no dysphagia/odynophagia, no hoarseness Cardiovascular: no chest pain, no shortness of breath, no palpitations, no leg swelling Respiratory: no cough, no shortness of breath Gastrointestinal: no nausea/vomiting/diarrhea Musculoskeletal: no muscle/joint aches Skin: no rashes, no hyperemia Neurological: no tremors, no numbness, no tingling, no dizziness Psychiatric: no depression, no anxiety  Objective:     BP 136/74   Pulse 68   Ht 5\' 11"  (1.803 m)   Wt 285 lb (129.3 kg)   BMI 39.75 kg/m   Wt Readings from Last 3 Encounters:  11/05/23 285 lb (129.3 kg)  07/21/23 284 lb 0.6 oz (128.8 kg)  04/09/23 277 lb 1.3 oz (125.7 kg)     BP Readings from Last 3 Encounters:  11/05/23 136/74  07/21/23 (!) 148/82  04/09/23 134/68     Physical Exam- Limited  Constitutional:  Body mass index is 39.75 kg/m. , not in acute distress, normal state of mind Eyes:  EOMI, no exophthalmos Neck: Supple Cardiovascular: RRR, no murmurs, rubs, or gallops, no edema Respiratory: Adequate breathing efforts, no crackles, rales, rhonchi, or wheezing Musculoskeletal: no gross deformities, strength intact  in all four extremities, no gross restriction of joint movements Skin:  no rashes, no hyperemia Neurological: no tremor with outstretched hands   Diabetic Foot Exam - Simple   No data filed      CMP ( most recent) CMP     Component Value Date/Time   NA 139 04/09/2023 1123   K 3.8 04/09/2023 1123   CL 100 04/09/2023 1123   CO2 24 04/09/2023 1123   GLUCOSE 290 (H) 04/09/2023 1123   GLUCOSE 217 (H) 10/16/2022 0816   BUN 14 04/09/2023 1123   CREATININE 0.79 04/09/2023 1123   CREATININE 0.42 (L) 12/24/2015 1055   CALCIUM  9.4 04/09/2023 1123   PROT 7.8 04/09/2023 1123   ALBUMIN 4.2 04/09/2023 1123   AST 37 04/09/2023 1123   ALT 40 (H) 04/09/2023 1123   ALKPHOS 146 (H) 04/09/2023 1123   BILITOT 0.4 04/09/2023 1123   EGFR 93 04/09/2023 1123   GFRNONAA >60 10/16/2022 0804     Diabetic Labs (most recent): Lab Results  Component Value Date   HGBA1C 11.3 (A) 11/05/2023   HGBA1C 12.0 (H) 04/09/2023   HGBA1C 11.5 (H) 11/07/2022     Lipid Panel ( most recent) Lipid Panel     Component Value Date/Time   CHOL 137 04/09/2023 1123   TRIG 143 04/09/2023 1123   HDL 32 (L) 04/09/2023 1123   CHOLHDL 4.3 04/09/2023 1123   LDLCALC 80 04/09/2023 1123   LABVLDL 25 04/09/2023 1123      Lab Results  Component Value Date   TSH 1.790 04/09/2023   TSH 1.750 11/07/2022   TSH 2.232 11/25/2015   TSH 2.606 03/21/2015   FREET4 1.33 04/09/2023           Assessment & Plan:   1) Type 2 diabetes mellitus with hyperglycemia, with long-term current use of insulin  (HCC) (Primary)  She presents today for her consultation with no meter or readings to review.  She has a CGM at home but recently upgraded phones and has not yet set it up on her new phone.  Her POCT A1c today is 11.3%, improving from last visit of 12%.  She drinks coffee with  creamer, tea with splenda, water, and 1 diet soda per day.  She eats 2 meals per day and snacks between.  She does engage in routine physical activity.   She is due for eye exam now, has never seen podiatry in the past.    - Kathryn Morris has currently uncontrolled symptomatic type 2 DM since 48 years of age, with most recent A1c of 11.3 %.   -Recent labs reviewed.  - I had a long discussion with her about the progressive nature of diabetes and the pathology behind its complications. -her diabetes is not currently complicated but she remains at a high risk for more acute and chronic complications which include CAD, CVA, CKD, retinopathy, and neuropathy. These are all discussed in detail with her.  The following Lifestyle Medicine recommendations according to American College of Lifestyle Medicine [facility]) were discussed and offered to patient and she agrees to start the journey:  A. Whole Foods, Plant-based plate comprising of fruits and vegetables, plant-based proteins, whole-grain carbohydrates was discussed in detail with the patient.   A list for source of those nutrients were also provided to the patient.  Patient will use only water or unsweetened tea for hydration. B.  The need to stay away from risky substances including alcohol, smoking; obtaining 7 to 9 hours of restorative sleep, at least 150 minutes of moderate intensity exercise weekly, the importance of healthy social connections,  and stress reduction techniques were discussed. C.  A full color page of  Calorie density of various food groups per pound showing examples of each food groups was provided to the patient.  - I have counseled her on diet and weight management by adopting a carbohydrate restricted/protein rich diet. Patient is encouraged to switch to unprocessed or minimally processed complex starch and increased protein intake (animal or plant source), fruits, and vegetables. -  she is advised to stick to a routine mealtimes to eat 3 meals a day and avoid unnecessary snacks (to snack only to correct hypoglycemia).   - she acknowledges that there is a room for improvement in  her food and drink choices. - Suggestion is made for her to avoid simple carbohydrates from her diet including Cakes, Sweet Desserts, Ice Cream, Soda (diet and regular), Sweet Tea, Candies, Chips, Cookies, Store Bought Juices, Alcohol in Excess of 1-2 drinks a day, Artificial Sweeteners, Coffee Creamer, and "Sugar-free" Products. This will help patient to have more stable blood glucose profile and potentially avoid unintended weight gain.  - I have approached her with the following individualized plan to manage her diabetes and patient agrees:   -She is advised to adjust her Semglee  to 50 units SQ nightly (instead of BID).  She can continue her Metformin  1000 mg po twice daily.  Will restart Mounjaro  2.5 mg SQ weekly x 1 month, then increase to 5 mg SQ weekly thereafter.  -she is encouraged to start/continue monitoring glucose 4 times daily, before meals and before bed, to log their readings on the clinic sheets provided, and bring them to review at follow up appointment in 3 months.  - she is warned not to take insulin  without proper monitoring per orders. - Adjustment parameters are given to her for hypo and hyperglycemia in writing. - she is encouraged to call clinic for blood glucose levels less than 70 or above 300 mg /dl.  - Specific targets for  A1c; LDL, HDL, and Triglycerides were discussed with the patient.  2) Blood Pressure /Hypertension:  her blood pressure  is controlled to target.   she is advised to continue her current medications as prescribed by her PCP.  3) Lipids/Hyperlipidemia:    Review of her recent lipid panel from 04/09/23 showed controlled LDL at 80 .  she is advised to continue Crestor  10 mg daily at bedtime.  Side effects and precautions discussed with her.  4)  Weight/Diet:  her Body mass index is 39.75 kg/m.  -  clearly complicating her diabetes care.   she is a candidate for weight loss. I discussed with her the fact that loss of 5 - 10% of her  current body  weight will have the most impact on her diabetes management.  Exercise, and detailed carbohydrates information provided  -  detailed on discharge instructions.  5) Chronic Care/Health Maintenance: -she is on ACEI/ARB and Statin medications and is encouraged to initiate and continue to follow up with Ophthalmology, Dentist, Podiatrist at least yearly or according to recommendations, and advised to stay away from smoking. I have recommended yearly flu vaccine and pneumonia vaccine at least every 5 years; moderate intensity exercise for up to 150 minutes weekly; and sleep for at least 7 hours a day.  - she is advised to maintain close follow up with Zarwolo, Gloria, FNP for primary care needs, as well as her other providers for optimal and coordinated care.   - Time spent in this patient care: 60 min, which was spent in counseling her about her diabetes and the rest reviewing her blood glucose logs, discussing her hypoglycemia and hyperglycemia episodes, reviewing her current and previous labs/studies (including abstraction from other facilities) and medications doses and developing a long term treatment plan based on the latest standards of care/guidelines; and documenting her care.    Please refer to Patient Instructions for Blood Glucose Monitoring and Insulin /Medications Dosing Guide" in media tab for additional information. Please also refer to "Patient Self Inventory" in the Media tab for reviewed elements of pertinent patient history.  Kathryn Morris participated in the discussions, expressed understanding, and voiced agreement with the above plans.  All questions were answered to her satisfaction. she is encouraged to contact clinic should she have any questions or concerns prior to her return visit.     Follow up plan: - Return in about 3 months (around 02/04/2024) for Diabetes F/U with A1c in office, No previsit labs, Bring meter and logs.    Kathryn Morris, [facility]  [facility] [address] [place], [place] [address] Phone: [phone] Fax: [phone]  11/05/2023, 12:07 PM

## 2023-11-05 NOTE — Telephone Encounter (Signed)
 Pharmacy Patient Advocate Encounter   Received notification from CoverMyMeds that prior authorization for Mounjaro  is required/requested.   Insurance verification completed.   The patient is insured through [employer] .   Per test claim: PA required; PA submitted to above mentioned insurance via CoverMyMeds Key/confirmation #/EOC [id number] Status is pending   PA CANCELLED: An active authorization is already on file with an expiration date of 06/09/2024.

## 2023-11-12 ENCOUNTER — Ambulatory Visit: Payer: Self-pay | Admitting: Family Medicine

## 2023-11-30 ENCOUNTER — Encounter: Payer: Self-pay | Admitting: Nurse Practitioner

## 2023-12-01 MED ORDER — DEXCOM G7 RECEIVER DEVI: 1.0000 | Freq: Once | 0 refills | Status: AC

## 2024-01-05 ENCOUNTER — Ambulatory Visit (INDEPENDENT_AMBULATORY_CARE_PROVIDER_SITE_OTHER)

## 2024-01-05 DIAGNOSIS — E1142 Type 2 diabetes mellitus with diabetic polyneuropathy: Secondary | ICD-10-CM | POA: Diagnosis not present

## 2024-01-05 DIAGNOSIS — E1100 Type 2 diabetes mellitus with hyperosmolarity without nonketotic hyperglycemic-hyperosmolar coma (NKHHC): Secondary | ICD-10-CM

## 2024-01-05 LAB — HM DIABETES EYE EXAM

## 2024-01-05 NOTE — Progress Notes (Signed)
 Kathryn Morris arrived 01/05/2024 and has given verbal consent to obtain images and complete their overdue diabetic retinal screening.  The images have been sent to an ophthalmologist or optometrist for review and interpretation.  Results will be sent back to Zarwolo, Gloria, FNP for review.  Patient has been informed they will be contacted when we receive the results via telephone or MyChart

## 2024-01-06 ENCOUNTER — Ambulatory Visit: Admitting: Family Medicine

## 2024-01-07 ENCOUNTER — Other Ambulatory Visit: Payer: Self-pay | Admitting: Nurse Practitioner

## 2024-01-07 ENCOUNTER — Encounter: Payer: Self-pay | Admitting: Family Medicine

## 2024-01-07 ENCOUNTER — Ambulatory Visit: Admitting: Family Medicine

## 2024-01-07 VITALS — BP 128/74 | HR 85 | Resp 16 | Ht 71.0 in | Wt 273.0 lb

## 2024-01-07 DIAGNOSIS — E1100 Type 2 diabetes mellitus with hyperosmolarity without nonketotic hyperglycemic-hyperosmolar coma (NKHHC): Secondary | ICD-10-CM | POA: Diagnosis not present

## 2024-01-07 DIAGNOSIS — I10 Essential (primary) hypertension: Secondary | ICD-10-CM | POA: Diagnosis not present

## 2024-01-07 DIAGNOSIS — Z794 Long term (current) use of insulin: Secondary | ICD-10-CM | POA: Diagnosis not present

## 2024-01-07 MED ORDER — METOPROLOL SUCCINATE ER 50 MG PO TB24: 50.0000 mg | ORAL_TABLET | Freq: Every day | ORAL | 3 refills | Status: AC

## 2024-01-07 MED ORDER — LOSARTAN POTASSIUM 100 MG PO TABS
100.0000 mg | ORAL_TABLET | Freq: Every day | ORAL | 1 refills | Status: AC
Start: 2024-01-07 — End: 2024-02-06

## 2024-01-07 MED ORDER — SEMGLEE (YFGN) 100 UNIT/ML ~~LOC~~ SOPN: 50.0000 [IU] | PEN_INJECTOR | Freq: Every day | SUBCUTANEOUS | 2 refills | Status: DC

## 2024-01-07 MED ORDER — INSULIN DEGLUDEC 100 UNIT/ML ~~LOC~~ SOLN: 50.0000 [IU] | Freq: Every evening | SUBCUTANEOUS | 3 refills | Status: AC

## 2024-01-07 NOTE — Patient Instructions (Addendum)
 I appreciate the opportunity to provide care to you today!    Follow up:  3 months  Labs: please stop by the lab today/ during the week to get your blood drawn (CBC, CMP, TSH, Lipid profile, HgA1c, Vit D)  Type 2 Diabetes -A refill for Semglee  has been sent to [facility]. -Please follow up with Whitney if your symptoms worsen or if there are any changes to your medication regimen.  You are encouraged to report to the emergency department if your blood sugar is consistently above 250 mg/dL, especially if you experience symptoms such as: -Nausea or vomiting -Abdominal pain -Fruity-smelling breath -Blurred vision -Unexplained weight loss -Confusion  Please follow up if your symptoms worsen or fail to improve.    Please continue to a heart-healthy diet and increase your physical activities. Try to exercise for at least five days a week.    It was a pleasure to see you and I look forward to continuing to work together on your health and well-being. Please do not hesitate to call the office if you need care or have questions about your care.  In case of emergency, please visit the Emergency Department for urgent care, or contact our clinic at [phone] to schedule an appointment. We're here to help you!   Have a wonderful day and week. With Gratitude, Prabhav Faulkenberry MSN, FNP-BC

## 2024-01-07 NOTE — Progress Notes (Signed)
 Established Patient Office Visit  Subjective:  Patient ID: Kathryn Morris, female    DOB: [birth year]  Age: 48 y.o. MRN: [id number]  CC:  Chief Complaint  Patient presents with   Diabetes    States the semglee  is on backorder and she has been out of it for 2 weeks and she wants to know if you can give her something else or if she needs to reach out to endo   Hypertension    Follow up visit     HPI [name] is a 48 y.o. female with a past medical history of diabetes and hypertension, presents for follow-up of chronic medical conditions and evaluation of the above complaints. She is currently under the care of an endocrinologist; however, she reports that her insulin  has been on backorder for the past two weeks, and her endocrinologist is not yet aware.  She endorses symptoms consistent with hyperglycemia, including polyuria, polydipsia, and polyphagia.  Past Medical History:  Diagnosis Date   Anemia    Diabetes mellitus without complication (HCC)    Hypertension    SVT (supraventricular tachycardia) (HCC)     Past Surgical History:  Procedure Laterality Date   ELECTROPHYSIOLOGIC STUDY N/A 01/01/2016   Procedure: SVT Ablation;  Surgeon: Kathryn Rakers, MD;  Location: [facility] INVASIVE CV LAB;  Service: Cardiovascular;  Laterality: N/A;   NO PAST SURGERIES      Family History  Problem Relation Age of Onset   Diabetes Mother    Heart failure Mother    Diabetes Sister     Social History   Socioeconomic History   Marital status: Single    Spouse name: Not on file   Number of children: Not on file   Years of education: Not on file   Highest education level: Not on file  Occupational History   Not on file  Tobacco Use   Smoking status: Never   Smokeless tobacco: Never  Vaping Use   Vaping status: Never Used  Substance and Sexual Activity   Alcohol use: No   Drug use: No   Sexual activity: Yes  Other Topics Concern   Not on file  Social History Narrative   Are you  right handed or left handed? Right   Are you currently employed ?    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    What type of home do you live in: 1 story or 2 story? one    Caffeine 1cup daily    Social Drivers of [employer] Morris Strain: Not on file  Food Insecurity: Not on file  Transportation Needs: Not on file  Physical Activity: Not on file  Stress: Not on file  Social Connections: Not on file  Intimate Partner Violence: Not on file    Outpatient Medications Prior to Visit  Medication Sig Dispense Refill   amLODipine  (NORVASC ) 10 MG tablet Take 1 tablet (10 mg total) by mouth daily. 90 tablet 1   aspirin EC 81 MG tablet Take 81 mg by mouth as needed for mild pain (palpitations).     Blood Glucose Monitoring Suppl DEVI 1 each by Does not apply route in the morning, at noon, and at bedtime. May substitute to any manufacturer covered by patient's insurance. 1 each 0   Cholecalciferol (VITAMIN D ) 50 MCG (2000 UT) CAPS Take 5,000 Units by mouth daily.     Continuous Glucose Sensor (  DEXCOM G7 SENSOR) MISC Follow the instructions on the package for continuous glucose monitoring 9 each 3   Cyanocobalamin (VITAMIN B 12) 250 MCG LOZG Take by mouth.     ferrous sulfate  325 (65 FE) MG tablet Take 1-2 tablets (325-650 mg total) by mouth 2 (two) times daily. Take 2 tablets by mouth in the AM and 1 tablet in the PM 120 tablet 2   hydrochlorothiazide  (HYDRODIURIL ) 25 MG tablet Take 1 tablet (25 mg total) by mouth daily as needed (fluid). 90 tablet 1   Magnesium 300 MG CAPS 1 capsule with a meal Orally Once a day     metFORMIN  (GLUCOPHAGE ) 1000 MG tablet Take 1 tablet (1,000 mg total) by mouth 2 (two) times daily with a meal. 180 tablet 3   Multiple Vitamin (MULTIVITAMIN WITH MINERALS) TABS tablet Take 1 tablet by mouth daily.     rosuvastatin  (CRESTOR ) 10 MG tablet Take 1 tablet (10 mg total) by mouth daily. 90 tablet 3    tirzepatide  (MOUNJARO ) 5 MG/0.5ML Pen Inject 5 mg into the skin once a week. 6 mL 1   losartan  (COZAAR ) 100 MG tablet Take 1 tablet (100 mg total) by mouth daily. 90 tablet 1   metoprolol  succinate (TOPROL -XL) 50 MG 24 hr tablet Take 1 tablet (50 mg total) by mouth daily. Take with or immediately following a meal. 90 tablet 3   folic acid (FOLVITE) 400 MCG tablet Take 400 mcg by mouth daily. (Patient not taking: Reported on 01/07/2024)     gabapentin  (NEURONTIN ) 100 MG capsule Take 1 capsule (100 mg total) by mouth at bedtime. (Patient not taking: Reported on 01/07/2024) 30 capsule 1   SEMGLEE , YFGN, 100 UNIT/ML Pen Inject 50 Units into the skin at bedtime. (Patient not taking: Reported on 01/07/2024) 40 mL 2   tirzepatide  (MOUNJARO ) 2.5 MG/0.5ML Pen Inject 2.5 mg into the skin once a week. 2 mL 0   No facility-administered medications prior to visit.    Allergies  Allergen Reactions   Lisinopril Hives    ROS Review of Systems  Constitutional:  Negative for chills and fever.  Eyes:  Negative for visual disturbance.  Respiratory:  Negative for chest tightness and shortness of breath.   Endocrine: Positive for polydipsia, polyphagia and polyuria.  Neurological:  Negative for dizziness and headaches.      Objective:    Physical Exam HENT:     Head: Normocephalic.     Mouth/Throat:     Mouth: Mucous membranes are moist.   Cardiovascular:     Rate and Rhythm: Normal rate.     Heart sounds: Normal heart sounds.  Pulmonary:     Effort: Pulmonary effort is normal.     Breath sounds: Normal breath sounds.   Neurological:     Mental Status: She is alert.     BP 128/74   Pulse 85   Resp 16   Ht 5' 11 (1.803 m)   Wt 273 lb (123.8 kg)   SpO2 95%   BMI 38.08 kg/m  Wt Readings from Last 3 Encounters:  01/07/24 273 lb (123.8 kg)  11/05/23 285 lb (129.3 kg)  07/21/23 284 lb 0.6 oz (128.8 kg)    Lab Results  Component Value Date   TSH 1.320 01/07/2024   Lab Results   Component Value Date   WBC 7.1 01/07/2024   HGB 12.5 01/07/2024   HCT 39.1 01/07/2024   MCV 93 01/07/2024   PLT 352 01/07/2024   Lab Results  Component  Value Date   NA 137 01/07/2024   K 4.3 01/07/2024   CO2 21 01/07/2024   GLUCOSE 371 (H) 01/07/2024   BUN 7 01/07/2024   CREATININE 0.77 01/07/2024   BILITOT 0.4 01/07/2024   ALKPHOS 139 (H) 01/07/2024   AST 28 01/07/2024   ALT 30 01/07/2024   PROT 7.4 01/07/2024   ALBUMIN 4.2 01/07/2024   CALCIUM  9.3 01/07/2024   ANIONGAP 9 10/16/2022   EGFR 96 01/07/2024   Lab Results  Component Value Date   CHOL 149 01/07/2024   Lab Results  Component Value Date   HDL 35 (L) 01/07/2024   Lab Results  Component Value Date   LDLCALC 80 01/07/2024   Lab Results  Component Value Date   TRIG 204 (H) 01/07/2024   Lab Results  Component Value Date   CHOLHDL 4.3 01/07/2024   Lab Results  Component Value Date   HGBA1C 12.6 (H) 01/07/2024      Assessment & Plan:  Type 2 diabetes mellitus with hyperosmolarity without coma, with long-term current use of insulin  [facility]) Assessment & Plan: An alternative prescription for Tresiba , to be injected subcutaneously at bedtime, was sent by her endocrinologist on 01/06/2024. The patient is encouraged to pick up and begin the new prescription. She is advised to follow up if her blood sugar remains consistently above 250 mg/dL, especially if she experiences symptoms such as nausea, vomiting, abdominal pain, fruity-smelling breath, blurred vision, weight loss, or confusion.   Primary hypertension Assessment & Plan: Controlled encouraged to continue taking metoprolol  50 mg daily, losartan  100 mg daily, hydrochlorothiazide  25 mg daily as needed for fluid retention, amlodipine  10 mg daily I recommend low sodium diet less than 2300 mg daily with increase physical activity moderate intensity 150 minutes weekly BP Readings from Last 3 Encounters:  01/07/24 128/74  11/05/23 136/74  07/21/23 (!)  148/82     Orders: -     Losartan  Potassium; Take 1 tablet (100 mg total) by mouth daily.  Dispense: 90 tablet; Refill: 1 -     Metoprolol  Succinate ER; Take 1 tablet (50 mg total) by mouth daily. Take with or immediately following a meal.  Dispense: 90 tablet; Refill: 3  Note: This chart has been completed using Engineer[employer] civil (consulting) software, and while attempts have been made to ensure accuracy, certain words and phrases may not be transcribed as intended.    Follow-up: Return in about 3 months (around 04/08/2024).   Guneet Delpino, FNP

## 2024-01-08 ENCOUNTER — Encounter: Payer: Self-pay | Admitting: Nurse Practitioner

## 2024-01-08 LAB — CBC WITH DIFFERENTIAL/PLATELET
Basophils Absolute: 0.1 10*3/uL (ref 0.0–0.2)
Basos: 1 %
EOS (ABSOLUTE): 0.2 10*3/uL (ref 0.0–0.4)
Eos: 2 %
Hematocrit: 39.1 % (ref 34.0–46.6)
Hemoglobin: 12.5 g/dL (ref 11.1–15.9)
Immature Grans (Abs): 0 10*3/uL (ref 0.0–0.1)
Immature Granulocytes: 0 %
Lymphocytes Absolute: 2.8 10*3/uL (ref 0.7–3.1)
Lymphs: 40 %
MCH: 29.8 pg (ref 26.6–33.0)
MCHC: 32 g/dL (ref 31.5–35.7)
MCV: 93 fL (ref 79–97)
Monocytes Absolute: 0.4 10*3/uL (ref 0.1–0.9)
Monocytes: 6 %
Neutrophils Absolute: 3.6 10*3/uL (ref 1.4–7.0)
Neutrophils: 51 %
Platelets: 352 10*3/uL (ref 150–450)
RBC: 4.19 x10E6/uL (ref 3.77–5.28)
RDW: 13.7 % (ref 11.7–15.4)
WBC: 7.1 10*3/uL (ref 3.4–10.8)

## 2024-01-08 LAB — HEMOGLOBIN A1C
Est. average glucose Bld gHb Est-mCnc: 315 mg/dL
Hgb A1c MFr Bld: 12.6 % — ABNORMAL HIGH (ref 4.8–5.6)

## 2024-01-08 LAB — CMP14+EGFR
ALT: 30 IU/L (ref 0–32)
AST: 28 IU/L (ref 0–40)
Albumin: 4.2 g/dL (ref 3.9–4.9)
Alkaline Phosphatase: 139 IU/L — ABNORMAL HIGH (ref 44–121)
BUN/Creatinine Ratio: 9 (ref 9–23)
BUN: 7 mg/dL (ref 6–24)
Bilirubin Total: 0.4 mg/dL (ref 0.0–1.2)
CO2: 21 mmol/L (ref 20–29)
Calcium: 9.3 mg/dL (ref 8.7–10.2)
Chloride: 100 mmol/L (ref 96–106)
Creatinine, Ser: 0.77 mg/dL (ref 0.57–1.00)
Globulin, Total: 3.2 g/dL (ref 1.5–4.5)
Glucose: 371 mg/dL — ABNORMAL HIGH (ref 70–99)
Potassium: 4.3 mmol/L (ref 3.5–5.2)
Sodium: 137 mmol/L (ref 134–144)
Total Protein: 7.4 g/dL (ref 6.0–8.5)
eGFR: 96 mL/min/{1.73_m2} (ref >59)

## 2024-01-08 LAB — LIPID PANEL
Chol/HDL Ratio: 4.3 ratio (ref 0.0–4.4)
Cholesterol, Total: 149 mg/dL (ref 100–199)
HDL: 35 mg/dL — ABNORMAL LOW (ref >39)
LDL Chol Calc (NIH): 80 mg/dL (ref 0–99)
Triglycerides: 204 mg/dL — ABNORMAL HIGH (ref 0–149)
VLDL Cholesterol Cal: 34 mg/dL (ref 5–40)

## 2024-01-08 LAB — TSH+FREE T4
Free T4: 1.35 ng/dL (ref 0.82–1.77)
TSH: 1.32 u[IU]/mL (ref 0.450–4.500)

## 2024-01-08 LAB — VITAMIN D 25 HYDROXY (VIT D DEFICIENCY, FRACTURES): Vit D, 25-Hydroxy: 29.5 ng/mL — ABNORMAL LOW (ref 30.0–100.0)

## 2024-01-10 NOTE — Assessment & Plan Note (Signed)
 An alternative prescription for Tresiba , to be injected subcutaneously at bedtime, was sent by her endocrinologist on 01/06/2024. The patient is encouraged to pick up and begin the new prescription. She is advised to follow up if her blood sugar remains consistently above 250 mg/dL, especially if she experiences symptoms such as nausea, vomiting, abdominal pain, fruity-smelling breath, blurred vision, weight loss, or confusion.

## 2024-01-10 NOTE — Assessment & Plan Note (Signed)
 Controlled encouraged to continue taking metoprolol  50 mg daily, losartan  100 mg daily, hydrochlorothiazide  25 mg daily as needed for fluid retention, amlodipine  10 mg daily I recommend low sodium diet less than 2300 mg daily with increase physical activity moderate intensity 150 minutes weekly BP Readings from Last 3 Encounters:  01/07/24 128/74  11/05/23 136/74  07/21/23 (!) 148/82

## 2024-01-10 NOTE — Assessment & Plan Note (Deleted)
 An alternative prescription for Tresiba , to be injected subcutaneously at bedtime, was sent by her endocrinologist on 01/06/2024. The patient is encouraged to pick up and begin the new prescription. She is advised to follow up if her blood sugar remains consistently above 250 mg/dL, especially if she experiences symptoms such as nausea, vomiting, abdominal pain, fruity-smelling breath, blurred vision, weight loss, or confusion.

## 2024-01-15 ENCOUNTER — Ambulatory Visit: Payer: Self-pay | Admitting: Family Medicine

## 2024-02-05 ENCOUNTER — Ambulatory Visit: Admitting: Nurse Practitioner

## 2024-02-05 ENCOUNTER — Ambulatory Visit: Payer: Self-pay | Admitting: Nurse Practitioner

## 2024-04-18 ENCOUNTER — Ambulatory Visit: Admitting: Family Medicine

## 2024-04-25 ENCOUNTER — Encounter: Payer: Self-pay | Admitting: Family Medicine

## 2024-08-01 ENCOUNTER — Other Ambulatory Visit (HOSPITAL_COMMUNITY): Payer: Self-pay
